# Patient Record
Sex: Female | Born: 1935 | Race: White | Hispanic: No | Smoking: Never smoker
Health system: Southern US, Community
[De-identification: ages and names within clinical notes are randomized; demographics above are authoritative.]

## PROBLEM LIST (undated history)

## (undated) DIAGNOSIS — E079 Disorder of thyroid, unspecified: Secondary | ICD-10-CM

## (undated) DIAGNOSIS — F329 Major depressive disorder, single episode, unspecified: Secondary | ICD-10-CM

## (undated) DIAGNOSIS — K649 Unspecified hemorrhoids: Secondary | ICD-10-CM

## (undated) DIAGNOSIS — N939 Abnormal uterine and vaginal bleeding, unspecified: Secondary | ICD-10-CM

## (undated) DIAGNOSIS — N809 Endometriosis, unspecified: Secondary | ICD-10-CM

## (undated) DIAGNOSIS — D219 Benign neoplasm of connective and other soft tissue, unspecified: Secondary | ICD-10-CM

## (undated) DIAGNOSIS — R112 Nausea with vomiting, unspecified: Secondary | ICD-10-CM

## (undated) DIAGNOSIS — F419 Anxiety disorder, unspecified: Secondary | ICD-10-CM

## (undated) DIAGNOSIS — F32A Depression, unspecified: Secondary | ICD-10-CM

## (undated) DIAGNOSIS — J302 Other seasonal allergic rhinitis: Secondary | ICD-10-CM

## (undated) DIAGNOSIS — R32 Unspecified urinary incontinence: Secondary | ICD-10-CM

## (undated) DIAGNOSIS — T7840XA Allergy, unspecified, initial encounter: Secondary | ICD-10-CM

## (undated) DIAGNOSIS — H269 Unspecified cataract: Secondary | ICD-10-CM

## (undated) DIAGNOSIS — Z9889 Other specified postprocedural states: Secondary | ICD-10-CM

## (undated) DIAGNOSIS — C19 Malignant neoplasm of rectosigmoid junction: Secondary | ICD-10-CM

## (undated) DIAGNOSIS — J45909 Unspecified asthma, uncomplicated: Secondary | ICD-10-CM

## (undated) DIAGNOSIS — E785 Hyperlipidemia, unspecified: Secondary | ICD-10-CM

## (undated) DIAGNOSIS — J309 Allergic rhinitis, unspecified: Secondary | ICD-10-CM

## (undated) DIAGNOSIS — I10 Essential (primary) hypertension: Secondary | ICD-10-CM

## (undated) DIAGNOSIS — R7303 Prediabetes: Secondary | ICD-10-CM

## (undated) DIAGNOSIS — K219 Gastro-esophageal reflux disease without esophagitis: Secondary | ICD-10-CM

## (undated) HISTORY — DX: Anxiety disorder, unspecified: F41.9

## (undated) HISTORY — DX: Allergic rhinitis, unspecified: J30.9

## (undated) HISTORY — DX: Prediabetes: R73.03

## (undated) HISTORY — DX: Depression, unspecified: F32.A

## (undated) HISTORY — PX: OTHER SURGICAL HISTORY: SHX169

## (undated) HISTORY — DX: Malignant neoplasm of rectosigmoid junction: C19

## (undated) HISTORY — DX: Unspecified asthma, uncomplicated: J45.909

## (undated) HISTORY — PX: TONSILLECTOMY: SHX5217

## (undated) HISTORY — PX: DILATION AND CURETTAGE OF UTERUS: SHX78

## (undated) HISTORY — DX: Abnormal uterine and vaginal bleeding, unspecified: N93.9

## (undated) HISTORY — DX: Endometriosis, unspecified: N80.9

## (undated) HISTORY — DX: Unspecified urinary incontinence: R32

## (undated) HISTORY — DX: Disorder of thyroid, unspecified: E07.9

## (undated) HISTORY — DX: Allergy, unspecified, initial encounter: T78.40XA

## (undated) HISTORY — PX: COLONOSCOPY: SHX174

## (undated) HISTORY — DX: Unspecified hemorrhoids: K64.9

## (undated) HISTORY — DX: Essential (primary) hypertension: I10

## (undated) HISTORY — DX: Major depressive disorder, single episode, unspecified: F32.9

## (undated) HISTORY — DX: Benign neoplasm of connective and other soft tissue, unspecified: D21.9

## (undated) HISTORY — PX: POLYPECTOMY: SHX149

## (undated) HISTORY — DX: Unspecified cataract: H26.9

## (undated) HISTORY — DX: Gastro-esophageal reflux disease without esophagitis: K21.9

## (undated) NOTE — *Deleted (*Deleted)
Diagnosis: COVID-19  Physician: Dr. Patrick Wright  Procedure: [facility] Med: Sotrovimab infusion - Provided patient with sotrovimab fact sheet for patients, parents, and caregivers prior to infusion.   Complications: No immediate complications noted  Discharge: Discharged home    

---

## 1995-09-17 DIAGNOSIS — C19 Malignant neoplasm of rectosigmoid junction: Secondary | ICD-10-CM

## 1995-09-17 HISTORY — PX: OTHER SURGICAL HISTORY: SHX169

## 1995-09-17 HISTORY — DX: Malignant neoplasm of rectosigmoid junction: C19

## 1995-09-17 HISTORY — PX: COLON SURGERY: SHX602

## 1996-04-22 ENCOUNTER — Encounter (INDEPENDENT_AMBULATORY_CARE_PROVIDER_SITE_OTHER): Payer: Self-pay | Admitting: *Deleted

## 1996-04-26 ENCOUNTER — Encounter: Payer: Self-pay | Admitting: Gastroenterology

## 1996-05-02 ENCOUNTER — Encounter: Payer: Self-pay | Admitting: Gastroenterology

## 1997-05-09 ENCOUNTER — Encounter (INDEPENDENT_AMBULATORY_CARE_PROVIDER_SITE_OTHER): Payer: Self-pay | Admitting: *Deleted

## 1998-08-18 ENCOUNTER — Other Ambulatory Visit: Admission: RE | Admit: 1998-08-18 | Discharge: 1998-08-18 | Payer: Self-pay | Admitting: Family Medicine

## 1998-08-25 ENCOUNTER — Ambulatory Visit (HOSPITAL_COMMUNITY): Admission: RE | Admit: 1998-08-25 | Discharge: 1998-08-25 | Payer: Self-pay | Admitting: Gastroenterology

## 1999-05-17 ENCOUNTER — Ambulatory Visit (HOSPITAL_COMMUNITY): Admission: RE | Admit: 1999-05-17 | Discharge: 1999-05-17 | Payer: Self-pay

## 1999-12-24 ENCOUNTER — Encounter: Payer: Self-pay | Admitting: Emergency Medicine

## 1999-12-24 ENCOUNTER — Emergency Department (HOSPITAL_COMMUNITY): Admission: EM | Admit: 1999-12-24 | Discharge: 1999-12-24 | Payer: Self-pay | Admitting: Emergency Medicine

## 2000-08-11 ENCOUNTER — Ambulatory Visit (HOSPITAL_COMMUNITY): Admission: RE | Admit: 2000-08-11 | Discharge: 2000-08-11 | Payer: Self-pay | Admitting: Family Medicine

## 2000-08-11 ENCOUNTER — Encounter: Payer: Self-pay | Admitting: Family Medicine

## 2000-10-31 ENCOUNTER — Encounter: Payer: Self-pay | Admitting: Gastroenterology

## 2001-04-08 ENCOUNTER — Other Ambulatory Visit: Admission: RE | Admit: 2001-04-08 | Discharge: 2001-04-08 | Payer: Self-pay | Admitting: Family Medicine

## 2001-04-15 ENCOUNTER — Ambulatory Visit (HOSPITAL_COMMUNITY): Admission: RE | Admit: 2001-04-15 | Discharge: 2001-04-15 | Payer: Self-pay | Admitting: Family Medicine

## 2001-04-15 ENCOUNTER — Encounter: Payer: Self-pay | Admitting: Family Medicine

## 2001-10-30 ENCOUNTER — Ambulatory Visit (HOSPITAL_COMMUNITY): Admission: RE | Admit: 2001-10-30 | Discharge: 2001-10-30 | Payer: Self-pay | Admitting: Family Medicine

## 2001-10-30 ENCOUNTER — Encounter: Payer: Self-pay | Admitting: Family Medicine

## 2003-04-19 ENCOUNTER — Other Ambulatory Visit: Admission: RE | Admit: 2003-04-19 | Discharge: 2003-04-19 | Payer: Self-pay | Admitting: Family Medicine

## 2004-05-10 ENCOUNTER — Encounter: Payer: Self-pay | Admitting: Gastroenterology

## 2005-08-23 ENCOUNTER — Ambulatory Visit (HOSPITAL_COMMUNITY): Admission: RE | Admit: 2005-08-23 | Discharge: 2005-08-23 | Payer: Self-pay | Admitting: Family Medicine

## 2005-10-04 ENCOUNTER — Other Ambulatory Visit: Admission: RE | Admit: 2005-10-04 | Discharge: 2005-10-04 | Payer: Self-pay | Admitting: Family Medicine

## 2007-06-23 ENCOUNTER — Ambulatory Visit: Payer: Self-pay | Admitting: Gastroenterology

## 2007-07-03 ENCOUNTER — Ambulatory Visit: Payer: Self-pay | Admitting: Gastroenterology

## 2008-03-09 ENCOUNTER — Encounter: Payer: Self-pay | Admitting: Gastroenterology

## 2009-06-22 ENCOUNTER — Ambulatory Visit: Payer: Self-pay | Admitting: Gastroenterology

## 2009-06-22 DIAGNOSIS — K219 Gastro-esophageal reflux disease without esophagitis: Secondary | ICD-10-CM | POA: Insufficient documentation

## 2009-06-22 DIAGNOSIS — R1319 Other dysphagia: Secondary | ICD-10-CM

## 2009-06-22 DIAGNOSIS — Z85038 Personal history of other malignant neoplasm of large intestine: Secondary | ICD-10-CM | POA: Insufficient documentation

## 2010-05-14 ENCOUNTER — Encounter: Payer: Self-pay | Admitting: Gastroenterology

## 2010-05-17 ENCOUNTER — Encounter (INDEPENDENT_AMBULATORY_CARE_PROVIDER_SITE_OTHER): Payer: Self-pay | Admitting: *Deleted

## 2010-07-12 ENCOUNTER — Telehealth: Payer: Self-pay | Admitting: Gastroenterology

## 2010-07-17 ENCOUNTER — Encounter: Payer: Self-pay | Admitting: Gastroenterology

## 2010-08-22 ENCOUNTER — Ambulatory Visit: Payer: Self-pay | Admitting: Gastroenterology

## 2010-08-22 ENCOUNTER — Encounter (INDEPENDENT_AMBULATORY_CARE_PROVIDER_SITE_OTHER): Payer: Self-pay | Admitting: *Deleted

## 2010-09-11 ENCOUNTER — Encounter: Payer: Self-pay | Admitting: Gastroenterology

## 2010-10-16 NOTE — Miscellaneous (Signed)
Summary: Omeprazole refill  Clinical Lists Changes  Medications: Rx of OMEPRAZOLE 40 MG CPDR (OMEPRAZOLE) one tablet by mouth once daily Needs office visit!!;  #30 x 0;  Signed;  Entered by: Christie Nottingham CMA (AAMA);  Authorized by: Meryl Dare MD [facility];  Method used: Faxed to [facility], [address], [place], [place]  [address], Ph: [phone], Fax: [phone]    Prescriptions: OMEPRAZOLE 40 MG CPDR (OMEPRAZOLE) one tablet by mouth once daily Needs office visit!!  #30 x 0   Entered by:   Christie Nottingham CMA (AAMA)   Authorized by:   Meryl Dare MD [facility]   Signed by:   Christie Nottingham CMA (AAMA) on 07/17/2010   Method used:   Faxed to ...       [facility] (retail)       [address]       [place], [place]  [address]       Ph: [phone]       Fax: [phone]   RxID:   [id number]

## 2010-10-16 NOTE — Miscellaneous (Signed)
Summary: Omeprazole refill   Clinical Lists Changes  Medications: Changed medication from OMEPRAZOLE 40 MG CPDR (OMEPRAZOLE) one tablet by mouth once daily to OMEPRAZOLE 40 MG CPDR (OMEPRAZOLE) one tablet by mouth once daily NEEDS OFFICE VISIT FOR ANY FURTHER REFILLS! - Signed Rx of OMEPRAZOLE 40 MG CPDR (OMEPRAZOLE) one tablet by mouth once daily NEEDS OFFICE VISIT FOR ANY FURTHER REFILLS!;  #30 x 0;  Signed;  Entered by: Christie Nottingham CMA (AAMA);  Authorized by: Meryl Dare MD [facility];  Method used: Faxed to [facility], [address], [place], [place]  [address], Ph: [phone], Fax: [phone]    Prescriptions: OMEPRAZOLE 40 MG CPDR (OMEPRAZOLE) one tablet by mouth once daily NEEDS OFFICE VISIT FOR ANY FURTHER REFILLS!  #30 x 0   Entered by:   Christie Nottingham CMA (AAMA)   Authorized by:   Meryl Dare MD [facility]   Signed by:   Christie Nottingham CMA (AAMA) on 05/14/2010   Method used:   Faxed to ...       [facility] (retail)       [address]       [place], [place]  [address]       Ph: [phone]       Fax: [phone]   RxID:   [id number]

## 2010-10-16 NOTE — Letter (Signed)
Summary: [facility] Instructions  [facility]  [address] [place], [place] [address]   Phone: [phone]  Fax: [phone]       Tammie [name]    04-16-1935    MRN: [id number]        Procedure Day Dorna Bloom: Wednesday 10/17/2010       Arrival Time: 8:00am     Procedure Time: 9:00am     Location of Procedure:                    X  [facility] (4th Floor)   PREPARATION FOR COLONOSCOPY WITH MOVIPREP   Starting 5 days prior to your procedure 10/12/2010 do not eat nuts, seeds, popcorn, corn, beans, peas,  salads, or any raw vegetables.  Do not take any fiber supplements (e.g. Metamucil, Citrucel, and Benefiber).  THE DAY BEFORE YOUR PROCEDURE         Tuesday 10/16/2010  1.  Drink clear liquids the entire day-NO SOLID FOOD  2.  Do not drink anything colored red or purple.  Avoid juices with pulp.  No orange juice.  3.  Drink at least 64 oz. (8 glasses) of fluid/clear liquids during the day to prevent dehydration and help the prep work efficiently.  CLEAR LIQUIDS INCLUDE: Water Jello Ice Popsicles Tea (sugar ok, no milk/cream) Powdered fruit flavored drinks Coffee (sugar ok, no milk/cream) Gatorade Juice: apple, white grape, white cranberry  Lemonade Clear bullion, consomm, broth Carbonated beverages (any kind) Strained chicken noodle soup Hard Candy                             4.  In the morning, mix first dose of MoviPrep solution:    Empty 1 Pouch A and 1 Pouch B into the disposable container    Add lukewarm drinking water to the top line of the container. Mix to dissolve    Refrigerate (mixed solution should be used within 24 hrs)  5.  Begin drinking the prep at 5:00 p.m. The MoviPrep container is divided by 4 marks.   Every 15 minutes drink the solution down to the next mark (approximately 8 oz) until the full liter is complete.   6.  Follow completed prep with 16 oz of clear liquid of your choice (Nothing red or purple).  Continue to  drink clear liquids until bedtime.  7.  Before going to bed, mix second dose of MoviPrep solution:    Empty 1 Pouch A and 1 Pouch B into the disposable container    Add lukewarm drinking water to the top line of the container. Mix to dissolve    Refrigerate  THE DAY OF YOUR PROCEDURE      Wednesday 10/17/2010  Beginning at 4:00am (5 hours before procedure):         1. Every 15 minutes, drink the solution down to the next mark (approx 8 oz) until the full liter is complete.  2. Follow completed prep with 16 oz. of clear liquid of your choice.    3. You may drink clear liquids until 7:00am (2 HOURS BEFORE PROCEDURE).   MEDICATION INSTRUCTIONS  Unless otherwise instructed, you should take regular prescription medications with a small sip of water   as early as possible the morning of your procedure.           OTHER INSTRUCTIONS  You will need a responsible adult at least 74 years of age to accompany  you and drive you home.   This person must remain in the waiting room during your procedure.  Wear loose fitting clothing that is easily removed.  Leave jewelry and other valuables at home.  However, you may wish to bring a book to read or  an iPod/MP3 player to listen to music as you wait for your procedure to start.  Remove all body piercing jewelry and leave at home.  Total time from sign-in until discharge is approximately 2-3 hours.  You should go home directly after your procedure and rest.  You can resume normal activities the  day after your procedure.  The day of your procedure you should not:   Drive   Make legal decisions   Operate machinery   Drink alcohol   Return to work  You will receive specific instructions about eating, activities and medications before you leave.    The above instructions have been reviewed and explained to me by   _______________________    I fully understand and can verbalize these instructions  _____________________________ Date _________

## 2010-10-16 NOTE — Assessment & Plan Note (Signed)
Summary: REFLUX F/U.Tammie KitchenMarland KitchenAS.   History of Present Illness Visit Type: Follow-up Visit Primary GI MD: Elie Goody MD [facility] Primary Provider: Lindaann Pascal, PA-C Chief Complaint: Patient states that Omeprazole has stopped all symtoms of GERD History of Present Illness:   Tammie [name] reports complete resolution of her dysphagia and reflux symptoms shortly after beginning omeprazole last year. She canceled her endoscopy in January. She has no gastrointestinal complaints. She is status post sigmoid colectomy in 1997 for T3, N0 colon cancer and is due for surveillance colonoscopy.   GI Review of Systems      Denies abdominal pain, acid reflux, belching, bloating, chest pain, dysphagia with liquids, dysphagia with solids, heartburn, loss of appetite, nausea, vomiting, vomiting blood, weight loss, and  weight gain.        Denies anal fissure, black tarry stools, change in bowel habit, constipation, diarrhea, diverticulosis, fecal incontinence, heme positive stool, hemorrhoids, irritable bowel syndrome, jaundice, light color stool, liver problems, rectal bleeding, and  rectal pain.   Current Medications (verified): 1)  Norvasc 5 Mg Tabs (Amlodipine Besylate) .... Take 1 Tablet By Mouth Once A Day 2)  Paxil 10 Mg Tabs (Paroxetine Hcl) .... Take 1 Tablet By Mouth Once A Day 3)  Fish Oil Double Strength 1200 Mg Caps (Omega-3 Fatty Acids) .... Take 1 Capsule Once Daily 4)  Vitamin D3 1000 Unit Tabs (Cholecalciferol) .... Take 1 Tablet By Mouth Once A Day 5)  Centrum Silver  Tabs (Multiple Vitamins-Minerals) .... Take 1 Tablet By Mouth Once A Day 6)  Omeprazole 40 Mg Cpdr (Omeprazole) .... One Tablet By Mouth Once Daily Needs Office Visit!! 7)  Fluticasone Propionate 50 Mcg/act Susp (Fluticasone Propionate) .... As Directed  Allergies (verified): 1)  ! * Fentanyl 2)  ! Demerol 3)  ! Pcn 4)  ! Phenergan 5)  ! Vicodin 6)  ! * Novacaine 7)  ! Heparin 8)  ! Erythromycin 9)  ! * Temazepam  Past  History:  Past Medical History: Reviewed history from 06/22/2009 and no changes required. Hypertension Allergic rhinitis Colorectal Cancer T3, N0 1997 Uterine fibroids Anxiety Disorder Depression Hemorrhoids  Past Surgical History: Reviewed history from 06/22/2009 and no changes required. Sigmoid colectomy 1997 D & C Tonsillectomy  Family History: Reviewed history from 06/22/2009 and no changes required. No FH of Colon Cancer:  Social History: Reviewed history from 06/22/2009 and no changes required. Patient has never smoked.  Alcohol Use - yes-occasional Daily Caffeine Use-2 cups daily Illicit Drug Use - no  Review of Systems  The patient denies allergy/sinus, anemia, anxiety-new, arthritis/joint pain, back pain, blood in urine, breast changes/lumps, change in vision, confusion, cough, coughing up blood, depression-new, fainting, fatigue, fever, headaches-new, hearing problems, heart murmur, heart rhythm changes, itching, menstrual pain, muscle pains/cramps, night sweats, nosebleeds, pregnancy symptoms, shortness of breath, skin rash, sleeping problems, sore throat, swelling of feet/legs, swollen lymph glands, thirst - excessive , urination - excessive , urination changes/pain, urine leakage, vision changes, and voice change.    Vital Signs:  Patient profile:   76 year old female Height:      64 inches Weight:      175 pounds BMI:     30.15 Pulse rate:   60 / minute Pulse rhythm:   regular BP sitting:   150 / 70  (right arm) Cuff size:   regular  Vitals Entered By: June McMurray CMA Duncan Dull) (August 22, 2010 10:25 AM)  Physical Exam  General:  Well developed, well nourished, no acute  distress. Head:  Normocephalic and atraumatic. Eyes:  PERRLA, no icterus. Mouth:  No deformity or lesions, dentition normal. Lungs:  Clear throughout to auscultation. Heart:  Regular rate and rhythm; no murmurs, rubs,  or bruits. Abdomen:  Soft, nontender and nondistended. No  masses, hepatosplenomegaly or hernias noted. Normal bowel sounds. Msk:  Symmetrical with no gross deformities. Normal posture. Neurologic:  Alert and  oriented x4;  grossly normal neurologically. Psych:  Alert and cooperative. Normal mood and affect.  Impression & Recommendations:  Problem # 1:  PERSONAL HX COLON CANCER (ICD-V10.05) T3 N0 colon cancer 1997. Due for surveillance colonoscopy. The risks, benefits and alternatives to colonoscopy with possible biopsy and possible polypectomy were discussed with the patient and they consent to proceed. The procedure will be scheduled electively. Orders: Colon/Endo (Colon/Endo)  Problem # 2:  GERD (ICD-530.81) Chronic GERD which is now well-controlled on omeprazole. Continue omeprazole, antireflux measures. Schedule endoscopy to screen for Barrett's.. The risks, benefits and alternatives to endoscopy with possible biopsy and possible dilation were discussed with the patient and they consent to proceed. The procedure will be scheduled electively. Orders: Colon/Endo (Colon/Endo)  Patient Instructions: 1)  Copy sent to : Tammie Pascal, PA-C 2)  Your procedure has been scheduled for 10/17/2010, please follow the seperate instructions.  3)  Your prescription(s) have been sent to you pharmacy.  4)  [facility] Patient Information Guide given to patient.  5)  Upper Endoscopy and colonoscopy brochures given.  6)  The medication list was reviewed and reconciled.  All changed / newly prescribed medications were explained.  A complete medication list was provided to the patient / caregiver.  Prescriptions: MOVIPREP 100 GM  SOLR (PEG-KCL-NACL-NASULF-NA ASC-C) As per prep instructions.  #1 x 0   Entered by:   Harlow Mares CMA (AAMA)   Authorized by:   Meryl Dare MD [facility]   Signed by:   Harlow Mares CMA (AAMA) on 08/22/2010   Method used:   Faxed to ...       [facility] (retail)       [address]       [place], [place]   [address]       Ph: [phone]       Fax: [phone]   RxID:   [id number] OMEPRAZOLE 40 MG CPDR (OMEPRAZOLE) one tablet by mouth once daily  #30 x 11   Entered by:   Harlow Mares CMA (AAMA)   Authorized by:   Meryl Dare MD [facility]   Signed by:   Harlow Mares CMA (AAMA) on 08/22/2010   Method used:   Faxed to ...       [facility] (retail)       [address]       [place], [place]  [address]       Ph: [phone]       Fax: [phone]   RxID:   [id number]

## 2010-10-16 NOTE — Progress Notes (Signed)
Summary: med request  Medications Added OMEPRAZOLE 40 MG CPDR (OMEPRAZOLE) one tablet by mouth once daily Needs office visit!!       Phone Note Call from Patient Call back at [facility] Phone [phone]   Caller: Patient Call For: Dr. Russella Dar Reason for Call: Talk to Nurse Summary of Call: needs rx renewal for Omeprazole... [facility], [phone] Initial call taken by: Vallarie Mare,  July 12, 2010 12:08 PM  Follow-up for Phone Call        Rx was sent to pts pharmacy and pt notified to schedule a appointment for any further refills. Follow-up by: Christie Nottingham CMA Duncan Dull),  July 12, 2010 12:15 PM    New/Updated Medications: OMEPRAZOLE 40 MG CPDR (OMEPRAZOLE) one tablet by mouth once daily Needs office visit!! Prescriptions: OMEPRAZOLE 40 MG CPDR (OMEPRAZOLE) one tablet by mouth once daily Needs office visit!!  #30 x 0   Entered by:   Christie Nottingham CMA (AAMA)   Authorized by:   Meryl Dare MD [facility]   Signed by:   Christie Nottingham CMA (AAMA) on 07/12/2010   Method used:   Faxed to ...       [facility] (retail)       [address]       [place], [place]  [address]       Ph: [phone]       Fax: [phone]   RxID:   [id number]

## 2010-10-16 NOTE — Letter (Signed)
Summary: Colonoscopy Letter  [facility]  [address] [place], [place] [address]   Phone: [phone]  Fax: [phone]      May 17, 2010 MRN: [id number]   Tammie [name] [address] [place], [place]  [address]   Dear [name],   According to your medical record, it is time for you to schedule a Colonoscopy. The American Cancer Society recommends this procedure as a method to detect early colon cancer. Patients with a family history of colon cancer, or a personal history of colon polyps or inflammatory bowel disease are at increased risk.  This letter has beeen generated based on the recommendations made at the time of your procedure. If you feel that in your particular situation this may no longer apply, please contact our office.  Please call our office at [phone] to schedule this appointment or to update your records at your earliest convenience.  Thank you for cooperating with [place] to provide you with the very best care possible.   Sincerely,  Judie Petit T. Russella Dar, M.D.  [facility] [phone]

## 2010-10-17 ENCOUNTER — Other Ambulatory Visit: Payer: Self-pay | Admitting: Gastroenterology

## 2010-10-17 ENCOUNTER — Encounter (AMBULATORY_SURGERY_CENTER): Payer: Medicare Other | Admitting: Gastroenterology

## 2010-10-17 ENCOUNTER — Encounter: Payer: Self-pay | Admitting: Gastroenterology

## 2010-10-17 ENCOUNTER — Ambulatory Visit: Admit: 2010-10-17 | Payer: Self-pay | Admitting: Gastroenterology

## 2010-10-17 DIAGNOSIS — K2940 Chronic atrophic gastritis without bleeding: Secondary | ICD-10-CM

## 2010-10-17 DIAGNOSIS — Z1211 Encounter for screening for malignant neoplasm of colon: Secondary | ICD-10-CM

## 2010-10-17 DIAGNOSIS — D126 Benign neoplasm of colon, unspecified: Secondary | ICD-10-CM

## 2010-10-17 DIAGNOSIS — K219 Gastro-esophageal reflux disease without esophagitis: Secondary | ICD-10-CM

## 2010-10-17 DIAGNOSIS — K2970 Gastritis, unspecified, without bleeding: Secondary | ICD-10-CM

## 2010-10-17 DIAGNOSIS — Z85038 Personal history of other malignant neoplasm of large intestine: Secondary | ICD-10-CM

## 2010-10-18 NOTE — Miscellaneous (Signed)
Summary: omeprazole refill  Clinical Lists Changes  Medications: Rx of OMEPRAZOLE 40 MG CPDR (OMEPRAZOLE) one tablet by mouth once daily;  #30 x 11;  Signed;  Entered by: Christie Nottingham CMA (AAMA);  Authorized by: Meryl Dare MD [facility];  Method used: Faxed to [facility], [address], [place], [place]  [address], Ph: [phone], Fax: [phone]    Prescriptions: OMEPRAZOLE 40 MG CPDR (OMEPRAZOLE) one tablet by mouth once daily  #30 x 11   Entered by:   Christie Nottingham CMA (AAMA)   Authorized by:   Meryl Dare MD [facility]   Signed by:   Christie Nottingham CMA (AAMA) on 09/11/2010   Method used:   Faxed to ...       [facility] (retail)       [address]       [place], [place]  [address]       Ph: [phone]       Fax: [phone]   RxID:   [id number]

## 2010-10-22 ENCOUNTER — Encounter: Payer: Self-pay | Admitting: Gastroenterology

## 2010-10-24 NOTE — Miscellaneous (Signed)
Summary: Change allergy  Clinical Lists Changes  Allergies: Removed allergy or adverse reaction of * FENTANYL

## 2010-10-24 NOTE — Procedures (Addendum)
Summary: Colonoscopy  Patient: [name] Note: All result statuses are Final unless otherwise noted.  Tests: (1) Colonoscopy (COL)   COL Colonoscopy           DONE     [facility]     [address]     [place], [place]  [address]           COLONOSCOPY PROCEDURE REPORT     PATIENT:  Tammie [name], Tammie [name]  MR#:  [id number]     BIRTHDATE:  07-23-1935, 75 yrs. old  GENDER:  female     ENDOSCOPIST:  Judie Petit T. Russella Dar, MD, [facility]           PROCEDURE DATE:  10/17/2010     PROCEDURE:  Colonoscopy with snare polypectomy     ASA CLASS:  Class II     INDICATIONS:  1) surveillance and high-risk screening  2) history     of colon cancer: T3, N0 1997.     MEDICATIONS:   Fentanyl 50 mcg IV, Versed 9 mg IV, Benadryl 50 mg     IV     DESCRIPTION OF PROCEDURE:   After the risks benefits and     alternatives of the procedure were thoroughly explained, informed     consent was obtained.  Digital rectal exam was performed and     revealed no abnormalities.   The LB [id number] endoscope     was introduced through the anus and advanced to the cecum, which     was identified by both the appendix and ileocecal valve, limited     by fair prep.    The quality of the prep was Moviprep fair.  The     instrument was then slowly withdrawn as the colon was fully     examined.     <<PROCEDUREIMAGES>>     FINDINGS:  A sessile polyp was found in the mid transverse colon.     It was 5 mm in size. Polyp was snared without cautery. Retrieval     was successful. snare polyp  There was evidence of a prior     segmental colectomy. in the sigmoid colon.  A normal appearing     cecum, ileocecal valve, and appendiceal orifice were identified.     The ascending, hepatic flexure, splenic flexure, descending colon,     and rectum appeared unremarkable. Retroflexed views in the rectum     revealed internal hemorrhoids, small. The time to cecum =  time to     Cecum 2.25  minutes. The scope was then withdrawn  (time =  10.5     min) from the patient and the procedure completed.     COMPLICATIONS:  None           ENDOSCOPIC IMPRESSION:     1) 5 mm sessile polyp in the mid transverse colon     2) Prior segmental colectomy in the sigmoid colon     3) Internal hemorrhoids           RECOMMENDATIONS:     1) Await pathology results     2) Repeat Colonoscopy in 3 years.           Venita Lick. Russella Dar, MD, [facility]           CC:  Lindaann Pascal, PA           n.     Rosalie DoctorVenita Lick. Roscoe Witts at 10/17/2010 09:53 AM  [place], [place], [id number]  Note: An exclamation mark (!) indicates a result that was not dispersed into the flowsheet. Document Creation Date: 10/17/2010 9:53 AM _______________________________________________________________________  (1) Order result status: Final Collection or observation date-time: 10/17/2010 09:47 Requested date-time:  Receipt date-time:  Reported date-time:  Referring Physician:   Ordering Physician: Claudette Head [phone]) Specimen Source:  Source: Launa Grill Order Number: [id number] Lab site:   Appended Document: Colonoscopy     Procedures Next Due Date:    Colonoscopy: 10/2013

## 2010-10-24 NOTE — Procedures (Addendum)
Summary: Upper Endoscopy  Patient: Tammie [name] Note: All result statuses are Final unless otherwise noted.  Tests: (1) Upper Endoscopy (EGD)   EGD Upper Endoscopy       DONE     [facility]     [address]     [place], [place]  [address]           ENDOSCOPY PROCEDURE REPORT     PATIENT:  Tammie [name], Tammie [name]  MR#:  [id number]     BIRTHDATE:  1935/01/24, 75 yrs. old  GENDER:  female     ENDOSCOPIST:  Judie Petit T. Russella Dar, MD, [facility]           PROCEDURE DATE:  10/17/2010     PROCEDURE:  EGD with biopsy, [id number]     ASA CLASS:  Class II     INDICATIONS:  GERD, Screening for Barrett's esopahgus in a GERD     patient.     MEDICATIONS:  There was residual sedation effect present from     prior procedure.     TOPICAL ANESTHETIC:  Exactacain Spray     DESCRIPTION OF PROCEDURE:   After the risks benefits and     alternatives of the procedure were thoroughly explained, informed     consent was obtained.  The LB [id number] [id number] endoscope was     introduced through the mouth and advanced to the second portion of     the duodenum, without limitations.  The instrument was slowly     withdrawn as the mucosa was fully examined.     <<PROCEDUREIMAGES>>     A stricture was found at the gastroesophageal junction. It was     circumferential and benign appearing. Multiple biopsies were     obtained and sent to pathology. Moderate gastritis was found in     the total stomach. It was erythematous and granular. Multiple     biopsies were obtained and sent to pathology. The duodenal bulb     was normal in appearance, as was the postbulbar duodenum.     Otherwise normal esophagus. Retroflexed views revealed a hiatal     hernia, small. The scope was then withdrawn from the patient and     the procedure completed.           COMPLICATIONS:  None           ENDOSCOPIC IMPRESSION:     1) Stricture at the gastroesophageal junction     2) Moderate gastritis     3) Small hiatal hernia        RECOMMENDATIONS:     1) Anti-reflux regimen     2) Await pathology results     3) PPI qam           Avner Stroder T. Russella Dar, MD, [facility]           CC:  Lindaann Pascal, PA           n.     Rosalie DoctorVenita Lick. Meghann Landing at 10/17/2010 10:04 AM           Raj Janus, [id number]  Note: An exclamation mark (!) indicates a result that was not dispersed into the flowsheet. Document Creation Date: 10/17/2010 10:05 AM _______________________________________________________________________  (1) Order result status: Final Collection or observation date-time: 10/17/2010 09:58 Requested date-time:  Receipt date-time:  Reported date-time:  Referring Physician:   Ordering Physician: Claudette Head [phone]) Specimen Source:  Source: Launa Grill Order Number: [id number] Lab site:

## 2010-11-01 NOTE — Letter (Signed)
Summary: Patient [facility] Biopsy Results  [facility]  [address] [place], [place] [address]   Phone: [phone]  Fax: [phone]        October 22, 2010 MRN: [id number]    Tammie [name] [address] [place], [place]  [address]    Dear Ms. Hewitt,  I am pleased to inform you that the biopsies taken during your recent endoscopic examination did not show any evidence of cancer upon pathologic examination. The biopsies showed reflux changes and mild gastritis.  Continue with the treatment plan as outlined on the day of your      exam.  Please call us if you are having persistent problems or have questions about your condition that have not been fully answered at this time.  Sincerely,  Meryl Dare MD [facility]  This letter has been electronically signed by your physician.  Appended Document: Patient Notice-Endo Biopsy Results LETTER MAILED

## 2010-11-01 NOTE — Letter (Signed)
Summary: Patient Notice- Polyp Results  [facility]  [address] [place], [place] [address]   Phone: [phone]  Fax: [phone]        October 22, 2010 MRN: [id number]    NIJA KOOPMAN [address] [place], [place]  [address]    Dear Ms. Pennisi,  I am pleased to inform you that the colon polyp(s) removed during your recent colonoscopy was (were) found to be benign (no cancer detected) upon pathologic examination.  I recommend you have a repeat colonoscopy examination in 3 years to look for recurrent polyps, as having colon polyps increases your risk for having recurrent polyps or even colon cancer in the future.  Should you develop new or worsening symptoms of abdominal pain, bowel habit changes or bleeding from the rectum or bowels, please schedule an evaluation with either your primary care physician or with me.  Continue treatment plan as outlined the day of your exam.  Please call us if you are having persistent problems or have questions about your condition that have not been fully answered at this time.  Sincerely,  Meryl Dare MD [facility]  This letter has been electronically signed by your physician.  Appended Document: Patient Notice- Polyp Results LETTER MAILED

## 2011-09-18 ENCOUNTER — Other Ambulatory Visit: Payer: Self-pay

## 2011-09-18 MED ORDER — OMEPRAZOLE 40 MG PO CPDR: 40.0000 mg | DELAYED_RELEASE_CAPSULE | Freq: Every day | ORAL | Status: DC

## 2012-05-27 ENCOUNTER — Encounter: Payer: Self-pay | Admitting: Cardiology

## 2012-05-27 ENCOUNTER — Ambulatory Visit (INDEPENDENT_AMBULATORY_CARE_PROVIDER_SITE_OTHER): Payer: Medicare Other | Admitting: Cardiology

## 2012-05-27 VITALS — BP 200/90 | HR 53 | Ht 64.0 in | Wt 175.0 lb

## 2012-05-27 DIAGNOSIS — R9431 Abnormal electrocardiogram [ECG] [EKG]: Secondary | ICD-10-CM

## 2012-05-27 MED ORDER — HYDROCHLOROTHIAZIDE 12.5 MG PO CAPS: 12.5000 mg | ORAL_CAPSULE | Freq: Every day | ORAL | Status: DC

## 2012-05-27 NOTE — Progress Notes (Signed)
HPI The patient denies any past cardiac risk factors. She does have blood pressure problems. She does have risk factors. She was referred when she was recently found to have an abnormal EKG with right bundle branch block. She also had some arm discomfort while her blood pressure cuff was being pumped up. She is not particularly active though she does help her for a young grandchild. She walks 50 yards to her mailbox. With this level of activity she denies chest pressure, neck or arm discomfort. She denies any palpitations, presyncope or syncope. She does get some dyspnea with exertion but she does not have PND or orthopnea. She has a mild lower extremity edema and has had a slow progressive weight gain.  Allergies  Allergen Reactions  . Erythromycin   . Fentanyl   . Heparin     REACTION: questionable  . Hydrocodone-Acetaminophen   . Meperidine Hcl   . Penicillins   . Procaine Hcl   . Promethazine Hcl   . Temazepam     Current Outpatient Prescriptions  Medication Sig Dispense Refill  . amLODipine (NORVASC) 5 MG tablet Take 5 mg by mouth daily.      . Multiple Vitamins-Minerals (CENTRUM SILVER PO) Take by mouth. 1 TAB DAILY      . Omega-3 Fatty Acids (FISH OIL) 1200 MG CAPS Take by mouth. DOUBLE STENGTH 1200MG   ---1 CAPSULE ONCE DAILY      . omeprazole (PRILOSEC) 40 MG capsule Take 1 capsule (40 mg total) by mouth daily.  30 capsule  1  . PARoxetine (PAXIL) 10 MG tablet Take 10 mg by mouth every morning.      . hydrochlorothiazide (MICROZIDE) 12.5 MG capsule Take 1 capsule (12.5 mg total) by mouth daily.  30 capsule  11  . DISCONTD: omeprazole (PRILOSEC) 40 MG capsule Take 40 mg by mouth daily.        Past Medical History  Diagnosis Date  . Hypertension   . Allergic rhinitis   . Colorectal cancer   . Anxiety disorder   . Depression   . Hemorrhoids     Past Surgical History  Procedure Date  . Colectomy 1997    SIGMOID COLECTOMY 1997  . Dilation and curettage of uterus    D & C  . Tonsillectomy     Family History  Problem Relation Age of Onset  . Sudden death Brother     Apparent DVT/PE  . Sudden death Cousin 24    Paternal cousin   . Coronary artery disease Cousin 29    Paternal cousin, MI    History   Social History  . Marital Status: Divorced    Spouse Name: N/A    Number of Children: N/A  . Years of Education: N/A   Occupational History  . Not on file.   Social History Main Topics  . Smoking status: Never Smoker   . Smokeless tobacco: Not on file  . Alcohol Use: Not on file  . Drug Use: Not on file  . Sexually Active: Not on file   Other Topics Concern  . Not on file   Social History Narrative   Lives alone.  Two children     ROS: Postive for headaches, palpitations, reflux.  Otherwise as stated in the HPI and negative for all other systems.   PHYSICAL EXAM BP 200/90  Pulse 53  Ht 5\' 4"  (1.626 m)  Wt 175 lb (79.379 kg)  BMI 30.04 kg/m2 GENERAL:  Well appearing HEENT:  Pupils  equal round and reactive, fundi not visualized, oral mucosa unremarkable NECK:  No jugular venous distention, waveform within normal limits, carotid upstroke brisk and symmetric, no bruits, no thyromegaly LYMPHATICS:  No cervical, inguinal adenopathy LUNGS:  Clear to auscultation bilaterally BACK:  No CVA tenderness CHEST:  Unremarkable HEART:  PMI not displaced or sustained,S1 and S2 within normal limits, no S3, no S4, no clicks, no rubs, no murmurs ABD:  Flat, positive bowel sounds normal in frequency in pitch, no bruits, no rebound, no guarding, no midline pulsatile mass, no hepatomegaly, no splenomegaly EXT:  2 plus pulses throughout, no edema, no cyanosis no clubbing SKIN:  No rashes no nodules NEURO:  Cranial nerves II through XII grossly intact, motor grossly intact throughout PSYCH:  Cognitively intact, oriented to person place and time   EKG:  Sinus rhythm, rate 53, right bundle branch block, QTC prolonged.  Compared with the previous EKG  dated 04/03/12 the QTC is slightly prolonged. 05/27/2012  ASSESSMENT AND PLAN  Abnormal EKG - The patient does have a right bundle branch block which is new since 2004. She has some risk factors and atypical symptoms. I think the pretest probability of obstructive coronary disease is somewhat low. However, she needs screening stress testing. Her blood pressure would preclude exercise treadmill testing and she would rather avoid this anyway. Therefore, she will have a [employer].  HTN - I instructed her on keeping a blood pressure diary. I will go ahead and start hydrochlorothiazide 12.5 in addition to her previous meds. She will get a basic metabolic profile in 10 days.  Overweight - We discussed specifics for weight loss.

## 2012-05-27 NOTE — Patient Instructions (Addendum)
Please start HCTZ 12.5 mg a day Continue all other medications as listed  Please have blood work in 2 weeks (BMP)  Your physician has requested that you have a lexiscan myoview. For further information please visit [email or web address] Please follow instruction sheet, as given.  Follow up will be based on your results.

## 2012-06-24 ENCOUNTER — Encounter (HOSPITAL_COMMUNITY): Payer: Medicare Other

## 2012-07-22 ENCOUNTER — Encounter (HOSPITAL_COMMUNITY): Payer: Medicare Other

## 2012-08-20 ENCOUNTER — Other Ambulatory Visit: Payer: Self-pay | Admitting: Family Medicine

## 2012-08-20 DIAGNOSIS — N63 Unspecified lump in unspecified breast: Secondary | ICD-10-CM

## 2012-09-21 ENCOUNTER — Ambulatory Visit
Admission: RE | Admit: 2012-09-21 | Discharge: 2012-09-21 | Disposition: A | Discharge status: Home / Self Care | Payer: Medicare Other | Source: Ambulatory Visit | Attending: Family Medicine | Admitting: Family Medicine

## 2012-09-21 DIAGNOSIS — N63 Unspecified lump in unspecified breast: Secondary | ICD-10-CM

## 2012-09-22 ENCOUNTER — Telehealth: Payer: Self-pay | Admitting: Gastroenterology

## 2012-09-22 NOTE — Telephone Encounter (Signed)
She should have her PCP refills this medication. If PCP will do this she does not need a routine office visit with [place] just for a refill. We prefer to have PCPs fill meds for mild, chronic and stable GI problems like GERD.

## 2012-09-22 NOTE — Telephone Encounter (Signed)
Patient wants one refill until her scheduled office visit but she has not been seen since December 2011 and last procedure was February 2012. Told patient I would have to ask the doctor since it has been so long and we have not been the one refilling the medicine in our system. Please advise if patient can a refill.

## 2012-09-23 NOTE — Telephone Encounter (Signed)
Pt notified by voicemail of Dr. Ardell Isaacs recommendations and to call back if she has any questions.

## 2012-10-13 ENCOUNTER — Ambulatory Visit: Payer: Medicare Other | Admitting: Gastroenterology

## 2013-03-10 ENCOUNTER — Other Ambulatory Visit: Payer: Self-pay | Admitting: *Deleted

## 2013-03-10 MED ORDER — HYDROCORTISONE 2.5 % RE CREA: TOPICAL_CREAM | Freq: Two times a day (BID) | RECTAL | Status: DC

## 2013-03-25 ENCOUNTER — Other Ambulatory Visit: Payer: Self-pay

## 2013-03-25 MED ORDER — PAROXETINE HCL 10 MG PO TABS: 10.0000 mg | ORAL_TABLET | ORAL | Status: DC

## 2013-03-25 NOTE — Telephone Encounter (Signed)
Last seen 3/14  MMM 

## 2013-04-20 ENCOUNTER — Other Ambulatory Visit: Payer: Self-pay | Admitting: *Deleted

## 2013-04-20 MED ORDER — PAROXETINE HCL 10 MG PO TABS: 20.0000 mg | ORAL_TABLET | ORAL | Status: DC

## 2013-05-14 ENCOUNTER — Encounter: Payer: Self-pay | Admitting: Family Medicine

## 2013-05-14 ENCOUNTER — Ambulatory Visit (HOSPITAL_COMMUNITY)
Admission: RE | Admit: 2013-05-14 | Discharge: 2013-05-14 | Disposition: A | Discharge status: Home / Self Care | Payer: Medicare Other | Source: Ambulatory Visit | Attending: Family Medicine | Admitting: Family Medicine

## 2013-05-14 ENCOUNTER — Ambulatory Visit (INDEPENDENT_AMBULATORY_CARE_PROVIDER_SITE_OTHER): Payer: Medicare Other | Admitting: Family Medicine

## 2013-05-14 ENCOUNTER — Ambulatory Visit (INDEPENDENT_AMBULATORY_CARE_PROVIDER_SITE_OTHER): Payer: Medicare Other

## 2013-05-14 VITALS — BP 165/64 | HR 59 | Temp 97.6°F | Ht 64.0 in | Wt 177.0 lb

## 2013-05-14 DIAGNOSIS — M25569 Pain in unspecified knee: Secondary | ICD-10-CM

## 2013-05-14 DIAGNOSIS — M79609 Pain in unspecified limb: Secondary | ICD-10-CM

## 2013-05-14 DIAGNOSIS — M25562 Pain in left knee: Secondary | ICD-10-CM

## 2013-05-14 DIAGNOSIS — M79662 Pain in left lower leg: Secondary | ICD-10-CM

## 2013-05-14 MED ORDER — PAROXETINE HCL 20 MG PO TABS: 20.0000 mg | ORAL_TABLET | Freq: Every day | ORAL | Status: DC

## 2013-05-14 NOTE — Patient Instructions (Signed)
Knee Pain  The knee is the complex joint between your thigh and your lower leg. It is made up of bones, tendons, ligaments, and cartilage. The bones that make up the knee are:   The femur in the thigh.   The tibia and fibula in the lower leg.   The patella or kneecap riding in the groove on the lower femur.  CAUSES   Knee pain is a common complaint with many causes. A few of these causes are:   Injury, such as:   A ruptured ligament or tendon injury.   Torn cartilage.   Medical conditions, such as:   Gout   Arthritis   Infections   Overuse, over training or overdoing a physical activity.  Knee pain can be minor or severe. Knee pain can accompany debilitating injury. Minor knee problems often respond well to self-care measures or get well on their own. More serious injuries may need medical intervention or even surgery.  SYMPTOMS  The knee is complex. Symptoms of knee problems can vary widely. Some of the problems are:   Pain with movement and weight bearing.   Swelling and tenderness.   Buckling of the knee.   Inability to straighten or extend your knee.   Your knee locks and you cannot straighten it.   Warmth and redness with pain and fever.   Deformity or dislocation of the kneecap.  DIAGNOSIS   Determining what is wrong may be very straight forward such as when there is an injury. It can also be challenging because of the complexity of the knee. Tests to make a diagnosis may include:   Your caregiver taking a history and doing a physical exam.   Routine X-rays can be used to rule out other problems. X-rays will not reveal a cartilage tear. Some injuries of the knee can be diagnosed by:   Arthroscopy a surgical technique by which a small video camera is inserted through tiny incisions on the sides of the knee. This procedure is used to examine and repair internal knee joint problems. Tiny instruments can be used during arthroscopy to repair the torn knee cartilage (meniscus).   Arthrography  is a radiology technique. A contrast liquid is directly injected into the knee joint. Internal structures of the knee joint then become visible on X-ray film.   An MRI scan is a non x-ray radiology procedure in which magnetic fields and a computer produce two- or three-dimensional images of the inside of the knee. Cartilage tears are often visible using an MRI scanner. MRI scans have largely replaced arthrography in diagnosing cartilage tears of the knee.   Blood work.   Examination of the fluid that helps to lubricate the knee joint (synovial fluid). This is done by taking a sample out using a needle and a syringe.  TREATMENT  The treatment of knee problems depends on the cause. Some of these treatments are:   Depending on the injury, proper casting, splinting, surgery or physical therapy care will be needed.   Give yourself adequate recovery time. Do not overuse your joints. If you begin to get sore during workout routines, back off. Slow down or do fewer repetitions.   For repetitive activities such as cycling or running, maintain your strength and nutrition.   Alternate muscle groups. For example if you are a weight lifter, work the upper body on one day and the lower body the next.   Either tight or weak muscles do not give the proper support for your   knee. Tight or weak muscles do not absorb the stress placed on the knee joint. Keep the muscles surrounding the knee strong.   Take care of mechanical problems.   If you have flat feet, orthotics or special shoes may help. See your caregiver if you need help.   Arch supports, sometimes with wedges on the inner or outer aspect of the heel, can help. These can shift pressure away from the side of the knee most bothered by osteoarthritis.   A brace called an "unloader" brace also may be used to help ease the pressure on the most arthritic side of the knee.   If your caregiver has prescribed crutches, braces, wraps or ice, use as directed. The acronym for  this is PRICE. This means protection, rest, ice, compression and elevation.   Nonsteroidal anti-inflammatory drugs (NSAID's), can help relieve pain. But if taken immediately after an injury, they may actually increase swelling. Take NSAID's with food in your stomach. Stop them if you develop stomach problems. Do not take these if you have a history of ulcers, stomach pain or bleeding from the bowel. Do not take without your caregiver's approval if you have problems with fluid retention, heart failure, or kidney problems.   For ongoing knee problems, physical therapy may be helpful.   Glucosamine and chondroitin are over-the-counter dietary supplements. Both may help relieve the pain of osteoarthritis in the knee. These medicines are different from the usual anti-inflammatory drugs. Glucosamine may decrease the rate of cartilage destruction.   Injections of a corticosteroid drug into your knee joint may help reduce the symptoms of an arthritis flare-up. They may provide pain relief that lasts a few months. You may have to wait a few months between injections. The injections do have a small increased risk of infection, water retention and elevated blood sugar levels.   Hyaluronic acid injected into damaged joints may ease pain and provide lubrication. These injections may work by reducing inflammation. A series of shots may give relief for as long as 6 months.   Topical painkillers. Applying certain ointments to your skin may help relieve the pain and stiffness of osteoarthritis. Ask your pharmacist for suggestions. Many over the-counter products are approved for temporary relief of arthritis pain.   In some countries, doctors often prescribe topical NSAID's for relief of chronic conditions such as arthritis and tendinitis. A review of treatment with NSAID creams found that they worked as well as oral medications but without the serious side effects.  PREVENTION   Maintain a healthy weight. Extra pounds put  more strain on your joints.   Get strong, stay limber. Weak muscles are a common cause of knee injuries. Stretching is important. Include flexibility exercises in your workouts.   Be smart about exercise. If you have osteoarthritis, chronic knee pain or recurring injuries, you may need to change the way you exercise. This does not mean you have to stop being active. If your knees ache after jogging or playing basketball, consider switching to swimming, water aerobics or other low-impact activities, at least for a few days a week. Sometimes limiting high-impact activities will provide relief.   Make sure your shoes fit well. Choose footwear that is right for your sport.   Protect your knees. Use the proper gear for knee-sensitive activities. Use kneepads when playing volleyball or laying carpet. Buckle your seat belt every time you drive. Most shattered kneecaps occur in car accidents.   Rest when you are tired.  SEEK MEDICAL CARE IF:     You have knee pain that is continual and does not seem to be getting better.   SEEK IMMEDIATE MEDICAL CARE IF:   Your knee joint feels hot to the touch and you have a high fever.  MAKE SURE YOU:    Understand these instructions.   Will watch your condition.   Will get help right away if you are not doing well or get worse.  Document Released: 06/30/2007 Document Revised: 11/25/2011 Document Reviewed: 06/30/2007  [employer] Patient Information 2014 [employer].

## 2013-05-14 NOTE — Progress Notes (Signed)
  Subjective:    Patient ID: Tammie [name], female    DOB: 01/07/1935, 78 y.o.   MRN: [id number]  HPI This 78 y.o. female presents for evaluation of left knee and lower extremity Discomfort.  She has been lower extremity edema and pain in her left  Leg and knee.   Review of Systems No chest pain, SOB, HA, dizziness, vision change, N/V, diarrhea, constipation, dysuria, urinary urgency or frequency, myalgias, arthralgias or rash.     Objective:   Physical Exam Vital signs noted  Well developed well nourished female.  HEENT - Head atraumatic Normocephalic Respiratory - Lungs CTA bilateral Cardiac - RRR S1 and S2 without murmur MS - Left knee posterior tender and calf tight and tender.   Extremities - Bilateral Lower extremity edema.  Xray of left knee - Preliminary - No fx and narrowing of joint space seen    Assessment & Plan:  Calf pain, left - Plan: US Venous Img Lower Unilateral Left, DG Knee 1-2 Views Left  Left knee pain - Plan: DG Knee 1-2 Views Left.  Discussed taking otc ibuprofen and follow up next week to evaluate how she is doing.

## 2013-05-20 ENCOUNTER — Telehealth: Payer: Self-pay | Admitting: Family Medicine

## 2013-05-20 NOTE — Telephone Encounter (Signed)
Patient aware.

## 2013-06-28 ENCOUNTER — Other Ambulatory Visit: Payer: Self-pay

## 2013-06-28 MED ORDER — PAROXETINE HCL 20 MG PO TABS: 20.0000 mg | ORAL_TABLET | Freq: Every day | ORAL | Status: DC

## 2013-07-26 ENCOUNTER — Encounter: Payer: Self-pay | Admitting: Family Medicine

## 2013-07-26 ENCOUNTER — Ambulatory Visit (INDEPENDENT_AMBULATORY_CARE_PROVIDER_SITE_OTHER): Payer: Medicare Other | Admitting: Family Medicine

## 2013-07-26 VITALS — Temp 97.8°F | Ht 64.0 in | Wt 181.8 lb

## 2013-07-26 DIAGNOSIS — R7309 Other abnormal glucose: Secondary | ICD-10-CM

## 2013-07-26 DIAGNOSIS — R739 Hyperglycemia, unspecified: Secondary | ICD-10-CM

## 2013-07-26 DIAGNOSIS — F411 Generalized anxiety disorder: Secondary | ICD-10-CM

## 2013-07-26 DIAGNOSIS — I10 Essential (primary) hypertension: Secondary | ICD-10-CM

## 2013-07-26 DIAGNOSIS — Z0000 Encounter for general adult medical examination without abnormal findings: Secondary | ICD-10-CM

## 2013-07-26 LAB — POCT CBC
Granulocyte percent: 74.7 %G (ref 37–80)
HCT, POC: 45.8 % (ref 37.7–47.9)
Hemoglobin: 15.5 g/dL (ref 12.2–16.2)
Lymph, poc: 2.3 (ref 0.6–3.4)
MCH, POC: 30.7 pg (ref 27–31.2)
MCHC: 33.8 g/dL (ref 31.8–35.4)
MCV: 90.8 fL (ref 80–97)
MPV: 7.4 fL (ref 0–99.8)
POC Granulocyte: 7.2 — AB (ref 2–6.9)
POC LYMPH PERCENT: 24.2 %L (ref 10–50)
Platelet Count, POC: 278 10*3/uL (ref 142–424)
RBC: 5.1 M/uL (ref 4.04–5.48)
RDW, POC: 12.8 %
WBC: 9.7 10*3/uL (ref 4.6–10.2)

## 2013-07-26 MED ORDER — AMLODIPINE BESYLATE 10 MG PO TABS: 10.0000 mg | ORAL_TABLET | Freq: Every day | ORAL | Status: DC

## 2013-07-26 MED ORDER — PAROXETINE HCL 20 MG PO TABS: 20.0000 mg | ORAL_TABLET | Freq: Every day | ORAL | Status: DC

## 2013-07-26 NOTE — Progress Notes (Signed)
  Subjective:    Patient ID: MACARENA LANGSETH, female    DOB: 1935/03/17, 78 y.o.   MRN: [id number]  HPI This 78 y.o. female presents for evaluation of anxiety and hypertension. She is needing refills.  She is doing fine and not having any acute medical Problems.   Review of Systems No chest pain, SOB, HA, dizziness, vision change, N/V, diarrhea, constipation, dysuria, urinary urgency or frequency, myalgias, arthralgias or rash.     Objective:   Physical Exam Vital signs noted  Well developed well nourished female.  HEENT - Head atraumatic Normocephalic                Eyes - PERRLA, Conjuctiva - clear Sclera- Clear EOMI                Ears - EAC's Wnl TM's Wnl Gross Hearing WNL                Nose - Nares patent                 Throat - oropharanx wnl Respiratory - Lungs CTA bilateral Cardiac - RRR S1 and S2 without murmur GI - Abdomen soft Nontender and bowel sounds active x 4 Extremities - No edema. Neuro - Grossly intact.       Assessment & Plan:  Routine general medical examination at a health care facility - Plan: POCT CBC, CMP14+EGFR, Thyroid Panel With TSH  Generalized anxiety disorder - Plan: PARoxetine (PAXIL) 20 MG tablet  Essential hypertension, benign - Plan: amLODipine (NORVASC) 10 MG tablet BP med is increased and recommend follow up in 1 months  Deatra Canter FNP

## 2013-07-26 NOTE — Patient Instructions (Signed)

## 2013-07-27 LAB — CMP14+EGFR
ALT: 32 IU/L (ref 0–32)
AST: 34 IU/L (ref 0–40)
Albumin/Globulin Ratio: 1.5 (ref 1.1–2.5)
Albumin: 4.1 g/dL (ref 3.5–4.8)
Alkaline Phosphatase: 106 IU/L (ref 39–117)
BUN/Creatinine Ratio: 13 (ref 11–26)
BUN: 9 mg/dL (ref 8–27)
CO2: 27 mmol/L (ref 18–29)
Calcium: 10.1 mg/dL (ref 8.6–10.2)
Chloride: 98 mmol/L (ref 97–108)
Creatinine, Ser: 0.69 mg/dL (ref 0.57–1.00)
GFR calc Af Amer: 98 mL/min/{1.73_m2} (ref >59)
GFR calc non Af Amer: 85 mL/min/{1.73_m2} (ref >59)
Globulin, Total: 2.8 g/dL (ref 1.5–4.5)
Glucose: 148 mg/dL — ABNORMAL HIGH (ref 65–99)
Potassium: 3.8 mmol/L (ref 3.5–5.2)
Sodium: 140 mmol/L (ref 134–144)
Total Bilirubin: 0.4 mg/dL (ref 0.0–1.2)
Total Protein: 6.9 g/dL (ref 6.0–8.5)

## 2013-07-27 LAB — THYROID PANEL WITH TSH
Free Thyroxine Index: 1.6 (ref 1.2–4.9)
T3 Uptake Ratio: 25 % (ref 24–39)
T4, Total: 6.5 ug/dL (ref 4.5–12.0)
TSH: 1.99 u[IU]/mL (ref 0.450–4.500)

## 2013-07-28 ENCOUNTER — Telehealth: Payer: Self-pay | Admitting: *Deleted

## 2013-07-28 LAB — POCT GLYCOSYLATED HEMOGLOBIN (HGB A1C): Hemoglobin A1C: 5.4

## 2013-07-28 NOTE — Telephone Encounter (Signed)
Pt notified of lab results Verbalizes understanding 

## 2013-07-28 NOTE — Addendum Note (Signed)
Addended by: Prescott Gum on: 07/28/2013 12:14 PM   Modules accepted: Orders

## 2013-09-17 ENCOUNTER — Telehealth: Payer: Self-pay | Admitting: Family Medicine

## 2013-09-20 ENCOUNTER — Encounter: Payer: Self-pay | Admitting: Family Medicine

## 2013-09-20 ENCOUNTER — Telehealth: Payer: Self-pay | Admitting: Family Medicine

## 2013-09-20 ENCOUNTER — Ambulatory Visit (INDEPENDENT_AMBULATORY_CARE_PROVIDER_SITE_OTHER): Payer: Medicare HMO | Admitting: Family Medicine

## 2013-09-20 VITALS — BP 172/67 | HR 63 | Temp 97.2°F | Ht 64.0 in | Wt 178.6 lb

## 2013-09-20 DIAGNOSIS — J209 Acute bronchitis, unspecified: Secondary | ICD-10-CM

## 2013-09-20 DIAGNOSIS — J309 Allergic rhinitis, unspecified: Secondary | ICD-10-CM

## 2013-09-20 MED ORDER — MONTELUKAST SODIUM 10 MG PO TABS: 10.0000 mg | ORAL_TABLET | Freq: Every day | ORAL | Status: DC

## 2013-09-20 MED ORDER — BENZONATATE 100 MG PO CAPS: 100.0000 mg | ORAL_CAPSULE | Freq: Three times a day (TID) | ORAL | Status: DC | PRN

## 2013-09-20 MED ORDER — AZITHROMYCIN 250 MG PO TABS: ORAL_TABLET | ORAL | Status: DC

## 2013-09-20 NOTE — Telephone Encounter (Signed)
appt given for today 

## 2013-09-20 NOTE — Progress Notes (Signed)
   Subjective:    Patient ID: Tammie [name], female    DOB: Apr 26, 1935, 79 y.o.   MRN: [id number]  HPI This 79 y.o. female presents for evaluation of URI sx's for over a week and she would Like to get a allergic rhinitis medicine.  She has tried all other medicine and nothing works.   Review of Systems C/o uri sx's No chest pain, SOB, HA, dizziness, vision change, N/V, diarrhea, constipation, dysuria, urinary urgency or frequency, myalgias, arthralgias or rash.     Objective:   Physical Exam Vital signs noted  Well developed well nourished female.  HEENT - Head atraumatic Normocephalic                Eyes - PERRLA, Conjuctiva - clear Sclera- Clear EOMI                Ears - EAC's Wnl TM's Wnl Gross Hearing WNL                Nose - Nares boggy with decreased patency                Throat - oropharanx wnl Respiratory - Lungs CTA bilateral Cardiac - RRR S1 and S2 without murmur GI - Abdomen soft Nontender and bowel sounds active x 4 Extremities - No edema. Neuro - Grossly intact.       Assessment & Plan:  Acute bronchitis - Plan: azithromycin (ZITHROMAX) 250 MG tablet, benzonatate (TESSALON PERLES) 100 MG capsule.  Push po fluids, rest, tylenol and motrin otc prn as directed for fever, arthralgias, and myalgias.  Follow up prn if sx's continue or persist.  Allergic rhinitis - Plan: montelukast (SINGULAIR) 10 MG tablet.  Lysbeth Penner FNP

## 2013-09-20 NOTE — Telephone Encounter (Signed)
Appt given for today 

## 2013-09-20 NOTE — Patient Instructions (Signed)

## 2013-09-21 ENCOUNTER — Telehealth: Payer: Self-pay | Admitting: Family Medicine

## 2013-09-21 MED ORDER — BENZONATATE 200 MG PO CAPS: 200.0000 mg | ORAL_CAPSULE | Freq: Two times a day (BID) | ORAL | Status: DC | PRN

## 2013-09-21 NOTE — Telephone Encounter (Signed)
Patient developed a mild rash years ago after taking erythromycin. She took zithromax a couple of years ago with no reaction. Discussed with Dietrich Pates, FNP. Patient is going to take the Zpak with Benadryl and will let us know if she has any problems.  Tessalon Perles 100mg  is on backorder. Changed to 200mg .

## 2013-09-22 ENCOUNTER — Encounter: Payer: Self-pay | Admitting: Gastroenterology

## 2013-09-27 ENCOUNTER — Ambulatory Visit (INDEPENDENT_AMBULATORY_CARE_PROVIDER_SITE_OTHER): Payer: Medicare HMO

## 2013-09-27 ENCOUNTER — Encounter: Payer: Self-pay | Admitting: Family Medicine

## 2013-09-27 ENCOUNTER — Ambulatory Visit (INDEPENDENT_AMBULATORY_CARE_PROVIDER_SITE_OTHER): Payer: Medicare HMO | Admitting: Family Medicine

## 2013-09-27 VITALS — BP 176/68 | HR 66 | Temp 97.3°F | Ht 64.0 in | Wt 176.4 lb

## 2013-09-27 DIAGNOSIS — I10 Essential (primary) hypertension: Secondary | ICD-10-CM | POA: Insufficient documentation

## 2013-09-27 DIAGNOSIS — J309 Allergic rhinitis, unspecified: Secondary | ICD-10-CM | POA: Insufficient documentation

## 2013-09-27 DIAGNOSIS — F410 Panic disorder [episodic paroxysmal anxiety] without agoraphobia: Secondary | ICD-10-CM | POA: Insufficient documentation

## 2013-09-27 DIAGNOSIS — J329 Chronic sinusitis, unspecified: Secondary | ICD-10-CM

## 2013-09-27 DIAGNOSIS — R05 Cough: Secondary | ICD-10-CM

## 2013-09-27 DIAGNOSIS — K219 Gastro-esophageal reflux disease without esophagitis: Secondary | ICD-10-CM

## 2013-09-27 DIAGNOSIS — R059 Cough, unspecified: Secondary | ICD-10-CM

## 2013-09-27 DIAGNOSIS — Z85048 Personal history of other malignant neoplasm of rectum, rectosigmoid junction, and anus: Secondary | ICD-10-CM | POA: Insufficient documentation

## 2013-09-27 DIAGNOSIS — Z85038 Personal history of other malignant neoplasm of large intestine: Secondary | ICD-10-CM

## 2013-09-27 DIAGNOSIS — J45909 Unspecified asthma, uncomplicated: Secondary | ICD-10-CM | POA: Insufficient documentation

## 2013-09-27 DIAGNOSIS — J4540 Moderate persistent asthma, uncomplicated: Secondary | ICD-10-CM

## 2013-09-27 MED ORDER — SULFAMETHOXAZOLE-TMP DS 800-160 MG PO TABS: 1.0000 | ORAL_TABLET | Freq: Two times a day (BID) | ORAL | Status: DC

## 2013-09-27 MED ORDER — FLUTICASONE PROPIONATE 50 MCG/ACT NA SUSP: 2.0000 | Freq: Every day | NASAL | Status: DC

## 2013-09-27 MED ORDER — BUDESONIDE-FORMOTEROL FUMARATE 160-4.5 MCG/ACT IN AERO: 2.0000 | INHALATION_SPRAY | Freq: Two times a day (BID) | RESPIRATORY_TRACT | Status: DC

## 2013-09-27 NOTE — Progress Notes (Signed)
Patient ID: Tammie [name], female   DOB: September 29, 1934, 79 y.o.   MRN: [id number] SUBJECTIVE: CC: Chief Complaint  Patient presents with  . Acute Visit    cough says had sinus infection and completed z pack     HPI: Recently treated for sinus infection and completed Zpak.has a persistent cough. No h/o asthma. Congested cough. Past Medical History  Diagnosis Date  . Anxiety disorder   . Hemorrhoids   . GERD (gastroesophageal reflux disease)   . Hypertension   . Colorectal cancer 1997    T3, N0  . Allergic rhinitis   . Depression   . Asthma    Past Surgical History  Procedure Laterality Date  . Colectomy  1997    SIGMOID COLECTOMY 1997  . Dilation and curettage of uterus      D & C  . Tonsillectomy     History   Social History  . Marital Status: Divorced    Spouse Name: N/A    Number of Children: N/A  . Years of Education: N/A   Occupational History  . Not on file.   Social History Main Topics  . Smoking status: Never Smoker   . Smokeless tobacco: Never Used  . Alcohol Use: No  . Drug Use: No  . Sexual Activity: Not on file   Other Topics Concern  . Not on file   Social History Narrative   Lives alone.  Two children    Family History  Problem Relation Age of Onset  . Sudden death Brother     Apparent DVT/PE  . Sudden death Cousin 9    Paternal cousin   . Coronary artery disease Cousin 55    Paternal cousin, MI  . Deep vein thrombosis Mother    Current Outpatient Prescriptions on File Prior to Visit  Medication Sig Dispense Refill  . amLODipine (NORVASC) 10 MG tablet Take 1 tablet (10 mg total) by mouth daily.  90 tablet  3  . benzonatate (TESSALON) 200 MG capsule Take 1 capsule (200 mg total) by mouth 2 (two) times daily as needed for cough.  20 capsule  0  . BIOTIN PO Take by mouth.      . hydrocortisone (PROCTOZONE-HC) 2.5 % rectal cream Place rectally 2 (two) times daily.  30 g  0  . montelukast (SINGULAIR) 10 MG tablet Take 1 tablet (10 mg  total) by mouth at bedtime.  30 tablet  3  . Multiple Vitamins-Minerals (CENTRUM SILVER PO) Take by mouth. 1 TAB DAILY      . Omega-3 Fatty Acids (FISH OIL) 1200 MG CAPS Take by mouth. DOUBLE STENGTH 1200MG   ---1 CAPSULE ONCE DAILY      . PARoxetine (PAXIL) 20 MG tablet Take 1 tablet (20 mg total) by mouth daily.  90 tablet  3  . vitamin E 100 UNIT capsule Take 100 Units by mouth daily.      [name] azithromycin (ZITHROMAX) 250 MG tablet Take 2 po first day and then one po qd x 4 days  6 tablet  1  . omeprazole (PRILOSEC) 40 MG capsule Take 1 capsule (40 mg total) by mouth daily.  30 capsule  1   No current facility-administered medications on file prior to visit.   Allergies  Allergen Reactions  . Erythromycin   . Fentanyl   . Heparin     REACTION: questionable  . Hydrocodone-Acetaminophen   . Meperidine Hcl   . Penicillins   . Procaine Hcl   . Promethazine  Hcl   . Temazepam     There is no immunization history on file for this patient. Prior to Admission medications   Medication Sig Start Date End Date Taking? Authorizing Provider  amLODipine (NORVASC) 10 MG tablet Take 1 tablet (10 mg total) by mouth daily. 07/26/13  Yes Lysbeth Penner, FNP  benzonatate (TESSALON) 200 MG capsule Take 1 capsule (200 mg total) by mouth 2 (two) times daily as needed for cough. 09/21/13  Yes Lysbeth Penner, FNP  BIOTIN PO Take by mouth.   Yes Historical Provider, MD  hydrocortisone (PROCTOZONE-HC) 2.5 % rectal cream Place rectally 2 (two) times daily. 03/10/13  Yes Mary-Margaret Hassell Done, FNP  montelukast (SINGULAIR) 10 MG tablet Take 1 tablet (10 mg total) by mouth at bedtime. 09/20/13  Yes Lysbeth Penner, FNP  Multiple Vitamins-Minerals (CENTRUM SILVER PO) Take by mouth. 1 TAB DAILY   Yes Historical Provider, MD  Omega-3 Fatty Acids (FISH OIL) 1200 MG CAPS Take by mouth. DOUBLE STENGTH 1200MG   ---1 CAPSULE ONCE DAILY   Yes Historical Provider, MD  PARoxetine (PAXIL) 20 MG tablet Take 1 tablet (20 mg  total) by mouth daily. 07/26/13  Yes Lysbeth Penner, FNP  vitamin E 100 UNIT capsule Take 100 Units by mouth daily.   Yes Historical Provider, MD  azithromycin (ZITHROMAX) 250 MG tablet Take 2 po first day and then one po qd x 4 days 09/20/13   Lysbeth Penner, FNP  omeprazole (PRILOSEC) 40 MG capsule Take 1 capsule (40 mg total) by mouth daily. 09/18/11 05/14/13  Ladene Artist, MD     ROS: As above in the HPI. All other systems are stable or negative.  OBJECTIVE: APPEARANCE:  Patient in no acute distress.The patient appeared well nourished and normally developed. Acyanotic. Waist: VITAL SIGNS:BP 176/68  Pulse 66  Temp(Src) 97.3 F (36.3 C) (Oral)  Ht 5\' 4"  (1.626 m)  Wt 176 lb 6.4 oz (80.015 kg)  BMI 30.26 kg/m2  SpO2 96% Obese WF  SKIN: warm and  Dry without overt rashes, tattoos and scars  HEAD and Neck: without JVD, Head and scalp: normal Eyes:No scleral icterus. Fundi normal, eye movements normal. Ears: Auricle normal, canal normal, Tympanic membranes normal, insufflation normal. Nose: nasal congestion Throat: normal Neck & thyroid: normal  CHEST & LUNGS: Chest wall: normal Lungs: Coarse breath sounds. Prolonged expiratory phase.and expiratory wheeze with coughing.  CVS: Reveals the PMI to be normally located. Regular rhythm, First and Second Heart sounds are normal,  absence of murmurs, rubs or gallops. Peripheral vasculature: Radial pulses: normal Dorsal pedis pulses: normal Posterior pulses: normal  ABDOMEN:  Appearance: normal Benign, no organomegaly, no masses, no Abdominal Aortic enlargement. No Guarding , no rebound. No Bruits. Bowel sounds: normal  RECTAL: N/A GU: N/A  EXTREMETIES: nonedematous.  MUSCULOSKELETAL:  Spine: normal Joints: intact  NEUROLOGIC: oriented to time,place and person; nonfocal. Strength is normal Sensory is normal Reflexes are normal Cranial Nerves are normal.  ASSESSMENT: Cough - Plan: DG Chest 2  View  GERD  PERSONAL HX COLON CANCER  Hypertension  Asthma, moderate persistent, uncomplicated - Plan: budesonide-formoterol (SYMBICORT) 160-4.5 MCG/ACT inhaler  Sinusitis nasal - Plan: fluticasone (FLONASE) 50 MCG/ACT nasal spray, sulfamethoxazole-trimethoprim (BACTRIM DS) 800-160 MG per tablet  PLAN:  Orders Placed This Encounter  Procedures  . DG Chest 2 View    Standing Status: Future     Number of Occurrences: 1     Standing Expiration Date: 11/27/2014    Order Specific Question:  Reason for Exam (SYMPTOM  OR DIAGNOSIS REQUIRED)    Answer:  cough    Order Specific Question:  Preferred imaging location?    Answer:  Internal  [facility] reading (PRIMARY) by  Dr. Jacelyn Grip: no acute infiltrate.                               Meds ordered this encounter  Medications  . fluticasone (FLONASE) 50 MCG/ACT nasal spray    Sig: Place 2 sprays into both nostrils daily.    Dispense:  16 g    Refill:  2  . sulfamethoxazole-trimethoprim (BACTRIM DS) 800-160 MG per tablet    Sig: Take 1 tablet by mouth 2 (two) times daily.    Dispense:  20 tablet    Refill:  0  . budesonide-formoterol (SYMBICORT) 160-4.5 MCG/ACT inhaler    Sig: Inhale 2 puffs into the lungs 2 (two) times daily.    Dispense:  1 Inhaler    Refill:  3       Dr Paula Libra Recommendations  For nutrition information, I recommend books:  1).Eat to Live by Dr Excell Seltzer. 2).Prevent and Reverse Heart Disease by Dr Karl Luke. 3) Dr Janene Harvey Book:  Program to Reverse Diabetes  Exercise recommendations are:  If unable to walk, then the patient can exercise in a chair 3 times a day. By flapping arms like a bird gently and raising legs outwards to the front.  If ambulatory, the patient can go for walks for 30 minutes 3 times a week. Then increase the intensity and duration as tolerated.  Goal is to try to attain exercise frequency to 5 times a week.  If applicable: Best to perform resistance exercises  (machines or weights) 2 days a week and cardio type exercises 3 days per week.  Handout on Asthma, sinusitis in the AVS There are no discontinued medications. Return in about 2 weeks (around 10/11/2013) for Recheck medical problems.  Britne Borelli P. Jacelyn Grip, M.D.

## 2013-09-27 NOTE — Patient Instructions (Signed)
Asthma, Adult Asthma is a recurring condition in which the airways tighten and narrow. Asthma can make it difficult to breathe. It can cause coughing, wheezing, and shortness of breath. Asthma episodes (also called asthma attacks) range from minor to life-threatening. Asthma cannot be cured, but medicines and lifestyle changes can help control it. CAUSES Asthma is believed to be caused by inherited (genetic) and environmental factors, but its exact cause is unknown. Asthma may be triggered by allergens, lung infections, or irritants in the air. Asthma triggers are different for each person. Common triggers include:   Animal dander.  Dust mites.  Cockroaches.  Pollen from trees or grass.  Mold.  Smoke.  Air pollutants such as dust, household cleaners, hair sprays, aerosol sprays, paint fumes, strong chemicals, or strong odors.  Cold air, weather changes, and winds (which increase molds and pollens in the air).  Strong emotional expressions such as crying or laughing hard.  Stress.  Certain medicines (such as aspirin) or types of drugs (such as beta-blockers).  Sulfites in foods and drinks. Foods and drinks that may contain sulfites include dried fruit, potato chips, and sparkling grape juice.  Infections or inflammatory conditions such as the flu, a cold, or an inflammation of the nasal membranes (rhinitis).  Gastroesophageal reflux disease (GERD).  Exercise or strenuous activity. SYMPTOMS Symptoms may occur immediately after asthma is triggered or many hours later. Symptoms include:  Wheezing.  Excessive nighttime or early morning coughing.  Frequent or severe coughing with a common cold.  Chest tightness.  Shortness of breath. DIAGNOSIS  The diagnosis of asthma is made by a review of your medical history and a physical exam. Tests may also be performed. These may include:  Lung function studies. These tests show how much air you breath in and out.  Allergy  tests.  Imaging tests such as X-rays. TREATMENT  Asthma cannot be cured, but it can usually be controlled. Treatment involves identifying and avoiding your asthma triggers. It also involves medicines. There are 2 classes of medicine used for asthma treatment:   Controller medicines. These prevent asthma symptoms from occurring. They are usually taken every day.  Reliever or rescue medicines. These quickly relieve asthma symptoms. They are used as needed and provide short-term relief. Your health care provider will help you create an asthma action plan. An asthma action plan is a written plan for managing and treating your asthma attacks. It includes a list of your asthma triggers and how they may be avoided. It also includes information on when medicines should be taken and when their dosage should be changed. An action plan may also involve the use of a device called a peak flow meter. A peak flow meter measures how well the lungs are working. It helps you monitor your condition. HOME CARE INSTRUCTIONS   Take medicine as directed by your health care provider. Speak with your health care provider if you have questions about how or when to take the medicines.  Use a peak flow meter as directed by your health care provider. Record and keep track of readings.  Understand and use the action plan to help minimize or stop an asthma attack without needing to seek medical care.  Control your home environment in the following ways to help prevent asthma attacks:  Do not smoke. Avoid being exposed to secondhand smoke.  Change your heating and air conditioning filter regularly.  Limit your use of fireplaces and wood stoves.  Get rid of pests (such as roaches and   mice) and their droppings.  Throw away plants if you see mold on them.  Clean your floors and dust regularly. Use unscented cleaning products.  Try to have someone else vacuum for you regularly. Stay out of rooms while they are being  vacuumed and for a short while afterward. If you vacuum, use a dust mask from a hardware store, a double-layered or microfilter vacuum cleaner bag, or a vacuum cleaner with a HEPA filter.  Replace carpet with wood, tile, or vinyl flooring. Carpet can trap dander and dust.  Use allergy-proof pillows, mattress covers, and box spring covers.  Wash bed sheets and blankets every week in hot water and dry them in a dryer.  Use blankets that are made of polyester or cotton.  Clean bathrooms and kitchens with bleach. If possible, have someone repaint the walls in these rooms with mold-resistant paint. Keep out of the rooms that are being cleaned and painted.  Wash hands frequently. SEEK MEDICAL CARE IF:   You have wheezing, shortness of breath, or a cough even if taking medicine to prevent attacks.  The colored mucus you cough up (sputum) is thicker than usual.  Your sputum changes from clear or white to yellow, green, gray, or bloody.  You have any problems that may be related to the medicines you are taking (such as a rash, itching, swelling, or trouble breathing).  You are using a reliever medicine more than 2 3 times per week.  Your peak flow is still at 50 79% of you personal best after following your action plan for 1 hour. SEEK IMMEDIATE MEDICAL CARE IF:   You seem to be getting worse and are unresponsive to treatment during an asthma attack.  You are short of breath even at rest.  You get short of breath when doing very little physical activity.  You have difficulty eating, drinking, or talking due to asthma symptoms.  You develop chest pain.  You develop a fast heartbeat.  You have a bluish color to your lips or fingernails.  You are lightheaded, dizzy, or faint.  Your peak flow is less than 50% of your personal best.  You have a fever or persistent symptoms for more than 2 3 days.  You have a fever and symptoms suddenly get worse. MAKE SURE YOU:   Understand these  instructions.  Will watch your condition.  Will get help right away if you are not doing well or get worse. Document Released: 09/02/2005 Document Revised: 05/05/2013 Document Reviewed: 04/01/2013 [employer] Patient Information 2014 [place], [place].   Sinusitis Sinusitis is redness, soreness, and swelling (inflammation) of the paranasal sinuses. Paranasal sinuses are air pockets within the bones of your face (beneath the eyes, the middle of the forehead, or above the eyes). In healthy paranasal sinuses, mucus is able to drain out, and air is able to circulate through them by way of your nose. However, when your paranasal sinuses are inflamed, mucus and air can become trapped. This can allow bacteria and other germs to grow and cause infection. Sinusitis can develop quickly and last only a short time (acute) or continue over a long period (chronic). Sinusitis that lasts for more than 12 weeks is considered chronic.  CAUSES  Causes of sinusitis include:  Allergies.  Structural abnormalities, such as displacement of the cartilage that separates your nostrils (deviated septum), which can decrease the air flow through your nose and sinuses and affect sinus drainage.  Functional abnormalities, such as when the small hairs (cilia) that line  your sinuses and help remove mucus do not work properly or are not present. SYMPTOMS  Symptoms of acute and chronic sinusitis are the same. The primary symptoms are pain and pressure around the affected sinuses. Other symptoms include:  Upper toothache.  Earache.  Headache.  Bad breath.  Decreased sense of smell and taste.  A cough, which worsens when you are lying flat.  Fatigue.  Fever.  Thick drainage from your nose, which often is green and may contain pus (purulent).  Swelling and warmth over the affected sinuses. DIAGNOSIS  Your caregiver will perform a physical exam. During the exam, your caregiver may:  Look in your nose for signs of  abnormal growths in your nostrils (nasal polyps).  Tap over the affected sinus to check for signs of infection.  View the inside of your sinuses (endoscopy) with a special imaging device with a light attached (endoscope), which is inserted into your sinuses. If your caregiver suspects that you have chronic sinusitis, one or more of the following tests may be recommended:  Allergy tests.  Nasal culture A sample of mucus is taken from your nose and sent to a lab and screened for bacteria.  Nasal cytology A sample of mucus is taken from your nose and examined by your caregiver to determine if your sinusitis is related to an allergy. TREATMENT  Most cases of acute sinusitis are related to a viral infection and will resolve on their own within 10 days. Sometimes medicines are prescribed to help relieve symptoms (pain medicine, decongestants, nasal steroid sprays, or saline sprays).  However, for sinusitis related to a bacterial infection, your caregiver will prescribe antibiotic medicines. These are medicines that will help kill the bacteria causing the infection.  Rarely, sinusitis is caused by a fungal infection. In theses cases, your caregiver will prescribe antifungal medicine. For some cases of chronic sinusitis, surgery is needed. Generally, these are cases in which sinusitis recurs more than 3 times per year, despite other treatments. HOME CARE INSTRUCTIONS   Drink plenty of water. Water helps thin the mucus so your sinuses can drain more easily.  Use a humidifier.  Inhale steam 3 to 4 times a day (for example, sit in the bathroom with the shower running).  Apply a warm, moist washcloth to your face 3 to 4 times a day, or as directed by your caregiver.  Use saline nasal sprays to help moisten and clean your sinuses.  Take over-the-counter or prescription medicines for pain, discomfort, or fever only as directed by your caregiver. SEEK IMMEDIATE MEDICAL CARE IF:  You have increasing  pain or severe headaches.  You have nausea, vomiting, or drowsiness.  You have swelling around your face.  You have vision problems.  You have a stiff neck.  You have difficulty breathing. MAKE SURE YOU:   Understand these instructions.  Will watch your condition.  Will get help right away if you are not doing well or get worse. Document Released: 09/02/2005 Document Revised: 11/25/2011 Document Reviewed: 09/17/2011 [facility] Patient Information 2014 [place], [place].        Dr Paula Libra Recommendations  For nutrition information, I recommend books:  1).Eat to Live by Dr Excell Seltzer. 2).Prevent and Reverse Heart Disease by Dr Karl Luke. 3) Dr Janene Harvey Book:  Program to Reverse Diabetes  Exercise recommendations are:  If unable to walk, then the patient can exercise in a chair 3 times a day. By flapping arms like a bird gently and raising legs outwards to the  front.  If ambulatory, the patient can go for walks for 30 minutes 3 times a week. Then increase the intensity and duration as tolerated.  Goal is to try to attain exercise frequency to 5 times a week.  If applicable: Best to perform resistance exercises (machines or weights) 2 days a week and cardio type exercises 3 days per week.

## 2013-10-12 ENCOUNTER — Encounter: Payer: Self-pay | Admitting: Family Medicine

## 2013-10-12 ENCOUNTER — Ambulatory Visit (INDEPENDENT_AMBULATORY_CARE_PROVIDER_SITE_OTHER): Payer: Medicare HMO | Admitting: Family Medicine

## 2013-10-12 VITALS — BP 153/69 | HR 63 | Temp 97.2°F | Ht 64.0 in | Wt 180.0 lb

## 2013-10-12 DIAGNOSIS — I10 Essential (primary) hypertension: Secondary | ICD-10-CM

## 2013-10-12 DIAGNOSIS — K219 Gastro-esophageal reflux disease without esophagitis: Secondary | ICD-10-CM

## 2013-10-12 DIAGNOSIS — Z85038 Personal history of other malignant neoplasm of large intestine: Secondary | ICD-10-CM

## 2013-10-12 DIAGNOSIS — J45909 Unspecified asthma, uncomplicated: Secondary | ICD-10-CM

## 2013-10-12 MED ORDER — BUDESONIDE-FORMOTEROL FUMARATE 160-4.5 MCG/ACT IN AERO: 2.0000 | INHALATION_SPRAY | Freq: Two times a day (BID) | RESPIRATORY_TRACT | Status: DC

## 2013-10-12 NOTE — Patient Instructions (Signed)
Asthma, Adult Asthma is a recurring condition in which the airways tighten and narrow. Asthma can make it difficult to breathe. It can cause coughing, wheezing, and shortness of breath. Asthma episodes (also called asthma attacks) range from minor to life-threatening. Asthma cannot be cured, but medicines and lifestyle changes can help control it. CAUSES Asthma is believed to be caused by inherited (genetic) and environmental factors, but its exact cause is unknown. Asthma may be triggered by allergens, lung infections, or irritants in the air. Asthma triggers are different for each person. Common triggers include:   Animal dander.  Dust mites.  Cockroaches.  Pollen from trees or grass.  Mold.  Smoke.  Air pollutants such as dust, household cleaners, hair sprays, aerosol sprays, paint fumes, strong chemicals, or strong odors.  Cold air, weather changes, and winds (which increase molds and pollens in the air).  Strong emotional expressions such as crying or laughing hard.  Stress.  Certain medicines (such as aspirin) or types of drugs (such as beta-blockers).  Sulfites in foods and drinks. Foods and drinks that may contain sulfites include dried fruit, potato chips, and sparkling grape juice.  Infections or inflammatory conditions such as the flu, a cold, or an inflammation of the nasal membranes (rhinitis).  Gastroesophageal reflux disease (GERD).  Exercise or strenuous activity. SYMPTOMS Symptoms may occur immediately after asthma is triggered or many hours later. Symptoms include:  Wheezing.  Excessive nighttime or early morning coughing.  Frequent or severe coughing with a common cold.  Chest tightness.  Shortness of breath. DIAGNOSIS  The diagnosis of asthma is made by a review of your medical history and a physical exam. Tests may also be performed. These may include:  Lung function studies. These tests show how much air you breath in and out.  Allergy  tests.  Imaging tests such as X-rays. TREATMENT  Asthma cannot be cured, but it can usually be controlled. Treatment involves identifying and avoiding your asthma triggers. It also involves medicines. There are 2 classes of medicine used for asthma treatment:   Controller medicines. These prevent asthma symptoms from occurring. They are usually taken every day.  Reliever or rescue medicines. These quickly relieve asthma symptoms. They are used as needed and provide short-term relief. Your health care provider will help you create an asthma action plan. An asthma action plan is a written plan for managing and treating your asthma attacks. It includes a list of your asthma triggers and how they may be avoided. It also includes information on when medicines should be taken and when their dosage should be changed. An action plan may also involve the use of a device called a peak flow meter. A peak flow meter measures how well the lungs are working. It helps you monitor your condition. HOME CARE INSTRUCTIONS   Take medicine as directed by your health care provider. Speak with your health care provider if you have questions about how or when to take the medicines.  Use a peak flow meter as directed by your health care provider. Record and keep track of readings.  Understand and use the action plan to help minimize or stop an asthma attack without needing to seek medical care.  Control your home environment in the following ways to help prevent asthma attacks:  Do not smoke. Avoid being exposed to secondhand smoke.  Change your heating and air conditioning filter regularly.  Limit your use of fireplaces and wood stoves.  Get rid of pests (such as roaches and   mice) and their droppings.  Throw away plants if you see mold on them.  Clean your floors and dust regularly. Use unscented cleaning products.  Try to have someone else vacuum for you regularly. Stay out of rooms while they are being  vacuumed and for a short while afterward. If you vacuum, use a dust mask from a hardware store, a double-layered or microfilter vacuum cleaner bag, or a vacuum cleaner with a HEPA filter.  Replace carpet with wood, tile, or vinyl flooring. Carpet can trap dander and dust.  Use allergy-proof pillows, mattress covers, and box spring covers.  Wash bed sheets and blankets every week in hot water and dry them in a dryer.  Use blankets that are made of polyester or cotton.  Clean bathrooms and kitchens with bleach. If possible, have someone repaint the walls in these rooms with mold-resistant paint. Keep out of the rooms that are being cleaned and painted.  Wash hands frequently. SEEK MEDICAL CARE IF:   You have wheezing, shortness of breath, or a cough even if taking medicine to prevent attacks.  The colored mucus you cough up (sputum) is thicker than usual.  Your sputum changes from clear or white to yellow, green, gray, or bloody.  You have any problems that may be related to the medicines you are taking (such as a rash, itching, swelling, or trouble breathing).  You are using a reliever medicine more than 2 3 times per week.  Your peak flow is still at 50 79% of you personal best after following your action plan for 1 hour. SEEK IMMEDIATE MEDICAL CARE IF:   You seem to be getting worse and are unresponsive to treatment during an asthma attack.  You are short of breath even at rest.  You get short of breath when doing very little physical activity.  You have difficulty eating, drinking, or talking due to asthma symptoms.  You develop chest pain.  You develop a fast heartbeat.  You have a bluish color to your lips or fingernails.  You are lightheaded, dizzy, or faint.  Your peak flow is less than 50% of your personal best.  You have a fever or persistent symptoms for more than 2 3 days.  You have a fever and symptoms suddenly get worse. MAKE SURE YOU:   Understand these  instructions.  Will watch your condition.  Will get help right away if you are not doing well or get worse. Document Released: 09/02/2005 Document Revised: 05/05/2013 Document Reviewed: 04/01/2013 [employer] Patient Information 2014 [employer].  

## 2013-10-12 NOTE — Progress Notes (Signed)
Patient ID: Tammie [name], female   DOB: Dec 16, 1934, 79 y.o.   MRN: [id number] SUBJECTIVE: CC: Chief Complaint  Patient presents with  . Follow-up    2 week follow up  states she never started symbicort     HPI: Patient has been living in denial about asthma and therefore has not used the symbicort. She has been wheezing especially last night. Is very anxious about life. Cough and wheeze. No fever. No chest pains.  Past Medical History  Diagnosis Date  . Anxiety disorder   . Hemorrhoids   . GERD (gastroesophageal reflux disease)   . Hypertension   . Colorectal cancer 1997    T3, N0  . Allergic rhinitis   . Depression   . Asthma    Past Surgical History  Procedure Laterality Date  . Colectomy  1997    SIGMOID COLECTOMY 1997  . Dilation and curettage of uterus      D & C  . Tonsillectomy     History   Social History  . Marital Status: Divorced    Spouse Name: N/A    Number of Children: N/A  . Years of Education: N/A   Occupational History  . Not on file.   Social History Main Topics  . Smoking status: Never Smoker   . Smokeless tobacco: Never Used  . Alcohol Use: No  . Drug Use: No  . Sexual Activity: Not on file   Other Topics Concern  . Not on file   Social History Narrative   Lives alone.  Two children    Family History  Problem Relation Age of Onset  . Sudden death Brother     Apparent DVT/PE  . Sudden death Cousin 72    Paternal cousin   . Coronary artery disease Cousin 72    Paternal cousin, MI  . Deep vein thrombosis Mother    Current Outpatient Prescriptions on File Prior to Visit  Medication Sig Dispense Refill  . amLODipine (NORVASC) 10 MG tablet Take 1 tablet (10 mg total) by mouth daily.  90 tablet  3  . BIOTIN PO Take by mouth.      . fluticasone (FLONASE) 50 MCG/ACT nasal spray Place 2 sprays into both nostrils daily.  16 g  2  . hydrocortisone (PROCTOZONE-HC) 2.5 % rectal cream Place rectally 2 (two) times daily.  30 g  0  .  montelukast (SINGULAIR) 10 MG tablet Take 1 tablet (10 mg total) by mouth at bedtime.  30 tablet  3  . Multiple Vitamins-Minerals (CENTRUM SILVER PO) Take by mouth. 1 TAB DAILY      . Omega-3 Fatty Acids (FISH OIL) 1200 MG CAPS Take by mouth. DOUBLE STENGTH 1200MG   ---1 CAPSULE ONCE DAILY      . PARoxetine (PAXIL) 20 MG tablet Take 1 tablet (20 mg total) by mouth daily.  90 tablet  3  . vitamin E 100 UNIT capsule Take 100 Units by mouth daily.      [name] azithromycin (ZITHROMAX) 250 MG tablet Take 2 po first day and then one po qd x 4 days  6 tablet  1  . benzonatate (TESSALON) 200 MG capsule Take 1 capsule (200 mg total) by mouth 2 (two) times daily as needed for cough.  20 capsule  0  . omeprazole (PRILOSEC) 40 MG capsule Take 1 capsule (40 mg total) by mouth daily.  30 capsule  1  . sulfamethoxazole-trimethoprim (BACTRIM DS) 800-160 MG per tablet Take 1 tablet by mouth 2 (  two) times daily.  20 tablet  0   No current facility-administered medications on file prior to visit.   Allergies  Allergen Reactions  . Erythromycin   . Fentanyl   . Heparin     REACTION: questionable  . Hydrocodone-Acetaminophen   . Meperidine Hcl   . Penicillins   . Procaine Hcl   . Promethazine Hcl   . Temazepam     There is no immunization history on file for this patient. Prior to Admission medications   Medication Sig Start Date End Date Taking? Authorizing Provider  amLODipine (NORVASC) 10 MG tablet Take 1 tablet (10 mg total) by mouth daily. 07/26/13  Yes Lysbeth Penner, FNP  BIOTIN PO Take by mouth.   Yes Historical Provider, MD  budesonide-formoterol (SYMBICORT) 160-4.5 MCG/ACT inhaler Inhale 2 puffs into the lungs 2 (two) times daily. 09/27/13  Yes Vernie Shanks, MD  fluticasone (FLONASE) 50 MCG/ACT nasal spray Place 2 sprays into both nostrils daily. 09/27/13  Yes Vernie Shanks, MD  hydrocortisone (PROCTOZONE-HC) 2.5 % rectal cream Place rectally 2 (two) times daily. 03/10/13  Yes Mary-Margaret Hassell Done,  FNP  montelukast (SINGULAIR) 10 MG tablet Take 1 tablet (10 mg total) by mouth at bedtime. 09/20/13  Yes Lysbeth Penner, FNP  Multiple Vitamins-Minerals (CENTRUM SILVER PO) Take by mouth. 1 TAB DAILY   Yes Historical Provider, MD  Omega-3 Fatty Acids (FISH OIL) 1200 MG CAPS Take by mouth. DOUBLE STENGTH 1200MG   ---1 CAPSULE ONCE DAILY   Yes Historical Provider, MD  PARoxetine (PAXIL) 20 MG tablet Take 1 tablet (20 mg total) by mouth daily. 07/26/13  Yes Lysbeth Penner, FNP  vitamin E 100 UNIT capsule Take 100 Units by mouth daily.   Yes Historical Provider, MD  azithromycin (ZITHROMAX) 250 MG tablet Take 2 po first day and then one po qd x 4 days 09/20/13   Lysbeth Penner, FNP  benzonatate (TESSALON) 200 MG capsule Take 1 capsule (200 mg total) by mouth 2 (two) times daily as needed for cough. 09/21/13   Lysbeth Penner, FNP  omeprazole (PRILOSEC) 40 MG capsule Take 1 capsule (40 mg total) by mouth daily. 09/18/11 05/14/13  Ladene Artist, MD  sulfamethoxazole-trimethoprim (BACTRIM DS) 800-160 MG per tablet Take 1 tablet by mouth 2 (two) times daily. 09/27/13   Vernie Shanks, MD     ROS: As above in the HPI. All other systems are stable or negative.  OBJECTIVE: APPEARANCE:  Patient in no acute distress.The patient appeared well nourished and normally developed. Acyanotic. Waist: VITAL SIGNS:BP 153/69  Pulse 63  Temp(Src) 97.2 F (36.2 C) (Oral)  Ht 5\' 4"  (1.626 m)  Wt 180 lb (81.647 kg)  BMI 30.88 kg/m2  SpO2 93% Obese WF  SKIN: warm and  Dry without overt rashes, tattoos and scars  HEAD and Neck: without JVD, Head and scalp: normal Eyes:No scleral icterus. Fundi normal, eye movements normal. Ears: Auricle normal, canal normal, Tympanic membranes normal, insufflation normal. Nose: normal Throat: normal Neck & thyroid: normal  CHEST & LUNGS: Chest wall: normal Lungs: Coarse bs. scattered wheezes and rhonchi with poor airflow.no rales.   CVS: Reveals the PMI to be normally  located. Regular rhythm, First and Second Heart sounds are normal,  absence of murmurs, rubs or gallops. Peripheral vasculature: Radial pulses: normal  ABDOMEN:  Appearance: normal Benign, no organomegaly, no masses, no Abdominal Aortic enlargement. No Guarding , no rebound. No Bruits. Bowel sounds: normal  RECTAL: N/A GU: N/A  EXTREMETIES: nonedematous.  MUSCULOSKELETAL:  Spine: normal Joints: intact  NEUROLOGIC: oriented to time,place and person; nonfocal. Strength is normal Sensory is normal Reflexes are normal Cranial Nerves are normal.  ASSESSMENT: Asthma  Hypertension  PERSONAL HX COLON CANCER  GERD  PLAN: 1/2 hour counselling and education on stress management and anxiety management. Also, demonstrated use of the inhaler and counselled extensively on asthma and use of the inhaler.  No orders of the defined types were placed in this encounter.   Meds ordered this encounter  Medications  . budesonide-formoterol (SYMBICORT) 160-4.5 MCG/ACT inhaler    Sig: Inhale 2 puffs into the lungs 2 (two) times daily.    Dispense:  1 Inhaler    Refill:  3   Medications Discontinued During This Encounter  Medication Reason  . budesonide-formoterol (SYMBICORT) 160-4.5 MCG/ACT inhaler Reorder   Return in about 2 weeks (around 10/26/2013) for recheck asthma.  Davonne Jarnigan P. Jacelyn Grip, M.D.

## 2013-10-20 ENCOUNTER — Telehealth: Payer: Self-pay | Admitting: Gastroenterology

## 2013-10-20 ENCOUNTER — Telehealth: Payer: Self-pay | Admitting: Family Medicine

## 2013-10-20 NOTE — Telephone Encounter (Signed)
Patient states she needs her omeprazole refilled. Reminded patient that we spoke last January about getting refills through her PCP instead of [place] to make it easier on her. Told patient that instead of coming to see [place] for just yearly refills that if she sees her PCP more often she can get refills through them. Patient states she just forgot that we had this conversation and remembers now. The patient states she will call her PCP for refills and that would be much better for her. Told her I will send this note to her PCP also so they will know for when she calls there office.

## 2013-10-21 ENCOUNTER — Other Ambulatory Visit: Payer: Self-pay | Admitting: Family Medicine

## 2013-10-21 MED ORDER — OMEPRAZOLE 40 MG PO CPDR: 40.0000 mg | DELAYED_RELEASE_CAPSULE | Freq: Every day | ORAL | Status: DC

## 2013-10-21 NOTE — Telephone Encounter (Signed)
PT AWARE TO CALL PHARMACY

## 2013-10-29 ENCOUNTER — Ambulatory Visit: Payer: Medicare HMO | Admitting: Family Medicine

## 2013-11-02 ENCOUNTER — Ambulatory Visit: Payer: Medicare HMO | Admitting: Family Medicine

## 2013-11-11 ENCOUNTER — Ambulatory Visit: Payer: Medicare HMO | Admitting: Family Medicine

## 2013-11-15 ENCOUNTER — Ambulatory Visit: Payer: Medicare Other | Admitting: Gastroenterology

## 2013-11-17 ENCOUNTER — Other Ambulatory Visit: Payer: Self-pay

## 2013-11-17 MED ORDER — OMEPRAZOLE 40 MG PO CPDR: 40.0000 mg | DELAYED_RELEASE_CAPSULE | Freq: Every day | ORAL | Status: DC

## 2013-12-03 ENCOUNTER — Encounter: Payer: Self-pay | Admitting: Family Medicine

## 2013-12-03 ENCOUNTER — Ambulatory Visit (INDEPENDENT_AMBULATORY_CARE_PROVIDER_SITE_OTHER): Payer: Medicare HMO | Admitting: Family Medicine

## 2013-12-03 VITALS — BP 178/76 | HR 62 | Temp 97.0°F | Ht 64.0 in | Wt 181.0 lb

## 2013-12-03 DIAGNOSIS — Z85038 Personal history of other malignant neoplasm of large intestine: Secondary | ICD-10-CM

## 2013-12-03 DIAGNOSIS — J45909 Unspecified asthma, uncomplicated: Secondary | ICD-10-CM

## 2013-12-03 DIAGNOSIS — I10 Essential (primary) hypertension: Secondary | ICD-10-CM

## 2013-12-03 DIAGNOSIS — K219 Gastro-esophageal reflux disease without esophagitis: Secondary | ICD-10-CM

## 2013-12-03 MED ORDER — LOSARTAN POTASSIUM 50 MG PO TABS
50.0000 mg | ORAL_TABLET | Freq: Every day | ORAL | Status: DC
Start: 2013-12-03 — End: 2013-12-27

## 2013-12-03 NOTE — Progress Notes (Signed)
Patient ID: Tammie [name], female   DOB: 26-May-1935, 79 y.o.   MRN: [id number] SUBJECTIVE: CC: Chief Complaint  Patient presents with  . Follow-up    2 week reck says she is better and thinks the inhaler makes her bp go up     HPI: 1) asthma better , no more wheezingand has accepted the fact that she is asthmatic.  2)Patient is here for follow up of hypertension: denies Headache;deniesChest Pain;denies weakness;denies Shortness of Breath or Orthopnea;denies Visual changes;denies palpitations;denies cough;denies pedal edema;denies symptoms of TIA or stroke; admits to Compliance with medications. Only on Norvasc and had assumed that her high BPs were due to stress of illness when she comes , however her BPs at home systolic has been [id number]. denies Problems with medications.  3)anxiety and  Depression. Stable.  Past Medical History  Diagnosis Date  . Anxiety disorder   . Hemorrhoids   . GERD (gastroesophageal reflux disease)   . Hypertension   . Colorectal cancer 1997    T3, N0  . Allergic rhinitis   . Depression   . Asthma    Past Surgical History  Procedure Laterality Date  . Colectomy  1997    SIGMOID COLECTOMY 1997  . Dilation and curettage of uterus      D & C  . Tonsillectomy     History   Social History  . Marital Status: Divorced    Spouse Name: N/A    Number of Children: N/A  . Years of Education: N/A   Occupational History  . Not on file.   Social History Main Topics  . Smoking status: Never Smoker   . Smokeless tobacco: Never Used  . Alcohol Use: No  . Drug Use: No  . Sexual Activity: Not on file   Other Topics Concern  . Not on file   Social History Narrative   Lives alone.  Two children    Family History  Problem Relation Age of Onset  . Sudden death Brother     Apparent DVT/PE  . Sudden death Cousin 78    Paternal cousin   . Coronary artery disease Cousin 60    Paternal cousin, MI  . Deep vein thrombosis Mother    Current Outpatient  Prescriptions on File Prior to Visit  Medication Sig Dispense Refill  . amLODipine (NORVASC) 10 MG tablet Take 1 tablet (10 mg total) by mouth daily.  90 tablet  3  . BIOTIN PO Take by mouth.      . budesonide-formoterol (SYMBICORT) 160-4.5 MCG/ACT inhaler Inhale 2 puffs into the lungs 2 (two) times daily.  1 Inhaler  3  . fluticasone (FLONASE) 50 MCG/ACT nasal spray Place 2 sprays into both nostrils daily.  16 g  2  . hydrocortisone (PROCTOZONE-HC) 2.5 % rectal cream Place rectally 2 (two) times daily.  30 g  0  . Multiple Vitamins-Minerals (CENTRUM SILVER PO) Take by mouth. 1 TAB DAILY      . Omega-3 Fatty Acids (FISH OIL) 1200 MG CAPS Take by mouth. DOUBLE STENGTH 1200MG   ---1 CAPSULE ONCE DAILY      . omeprazole (PRILOSEC) 40 MG capsule Take 1 capsule (40 mg total) by mouth daily.  30 capsule  2  . PARoxetine (PAXIL) 20 MG tablet Take 1 tablet (20 mg total) by mouth daily.  90 tablet  3  . vitamin E 100 UNIT capsule Take 100 Units by mouth daily.      [name] azithromycin (ZITHROMAX) 250 MG tablet Take 2  po first day and then one po qd x 4 days  6 tablet  1  . benzonatate (TESSALON) 200 MG capsule Take 1 capsule (200 mg total) by mouth 2 (two) times daily as needed for cough.  20 capsule  0  . montelukast (SINGULAIR) 10 MG tablet Take 1 tablet (10 mg total) by mouth at bedtime.  30 tablet  3  . sulfamethoxazole-trimethoprim (BACTRIM DS) 800-160 MG per tablet Take 1 tablet by mouth 2 (two) times daily.  20 tablet  0   No current facility-administered medications on file prior to visit.   Allergies  Allergen Reactions  . Erythromycin   . Fentanyl   . Heparin     REACTION: questionable  . Hydrocodone-Acetaminophen   . Meperidine Hcl   . Penicillins   . Procaine Hcl   . Promethazine Hcl   . Temazepam     There is no immunization history on file for this patient. Prior to Admission medications   Medication Sig Start Date End Date Taking? Authorizing Provider  amLODipine (NORVASC) 10 MG  tablet Take 1 tablet (10 mg total) by mouth daily. 07/26/13  Yes Lysbeth Penner, FNP  BIOTIN PO Take by mouth.   Yes Historical Provider, MD  budesonide-formoterol (SYMBICORT) 160-4.5 MCG/ACT inhaler Inhale 2 puffs into the lungs 2 (two) times daily. 10/12/13  Yes Vernie Shanks, MD  fluticasone (FLONASE) 50 MCG/ACT nasal spray Place 2 sprays into both nostrils daily. 09/27/13  Yes Vernie Shanks, MD  hydrocortisone (PROCTOZONE-HC) 2.5 % rectal cream Place rectally 2 (two) times daily. 03/10/13  Yes Mary-Margaret Hassell Done, FNP  Multiple Vitamins-Minerals (CENTRUM SILVER PO) Take by mouth. 1 TAB DAILY   Yes Historical Provider, MD  Omega-3 Fatty Acids (FISH OIL) 1200 MG CAPS Take by mouth. DOUBLE STENGTH 1200MG   ---1 CAPSULE ONCE DAILY   Yes Historical Provider, MD  omeprazole (PRILOSEC) 40 MG capsule Take 1 capsule (40 mg total) by mouth daily. 11/17/13 07/14/15 Yes Vernie Shanks, MD  PARoxetine (PAXIL) 20 MG tablet Take 1 tablet (20 mg total) by mouth daily. 07/26/13  Yes Lysbeth Penner, FNP  vitamin E 100 UNIT capsule Take 100 Units by mouth daily.   Yes Historical Provider, MD  azithromycin (ZITHROMAX) 250 MG tablet Take 2 po first day and then one po qd x 4 days 09/20/13   Lysbeth Penner, FNP  benzonatate (TESSALON) 200 MG capsule Take 1 capsule (200 mg total) by mouth 2 (two) times daily as needed for cough. 09/21/13   Lysbeth Penner, FNP  montelukast (SINGULAIR) 10 MG tablet Take 1 tablet (10 mg total) by mouth at bedtime. 09/20/13   Lysbeth Penner, FNP  sulfamethoxazole-trimethoprim (BACTRIM DS) 800-160 MG per tablet Take 1 tablet by mouth 2 (two) times daily. 09/27/13   Vernie Shanks, MD     ROS: As above in the HPI. All other systems are stable or negative.  OBJECTIVE: APPEARANCE:  Patient in no acute distress.The patient appeared well nourished and normally developed. Acyanotic. Waist: VITAL SIGNS:BP 178/76  Pulse 62  Temp(Src) 97 F (36.1 C) (Oral)  Ht 5\' 4"  (1.626 m)  Wt 181  lb (82.101 kg)  BMI 31.05 kg/m2  SpO2 94% WF obese  SKIN: warm and  Dry without overt rashes, tattoos and scars  HEAD and Neck: without JVD, Head and scalp: normal Eyes:No scleral icterus. Fundi normal, eye movements normal. Ears: Auricle normal, canal normal, Tympanic membranes normal, insufflation normal. Nose: normal Throat: normal Neck &  thyroid: normal  CHEST & LUNGS: Chest wall: normal Lungs: Clear  CVS: Reveals the PMI to be normally located. Regular rhythm, First and Second Heart sounds are normal,  absence of murmurs, rubs or gallops. Peripheral vasculature: Radial pulses: normal Dorsal pedis pulses: normal Posterior pulses: normal  ABDOMEN:  Appearance:obese Benign, no organomegaly, no masses, no Abdominal Aortic enlargement. No Guarding , no rebound. No Bruits. Bowel sounds: normal  RECTAL: N/A GU: N/A  EXTREMETIES: nonedematous.  MUSCULOSKELETAL:  Spine: normal Joints: intact  NEUROLOGIC: oriented to time,place and person; nonfocal. Strength is normal Sensory is normal Reflexes are normal Cranial Nerves are normal.   ASSESSMENT: Asthma  Hypertension - Plan: losartan (COZAAR) 50 MG tablet  GERD  PERSONAL HX COLON CANCER Asthma better BP not at goal.   PLAN: Coping with her anxieties discussed. Review asthma with patient DASH Diet discussed incraesin vegetables and fruits in the diet Attempt to get BP to goal Weight reduction diet and weight loss and exercise.  No orders of the defined types were placed in this encounter.   Meds ordered this encounter  Medications  . losartan (COZAAR) 50 MG tablet    Sig: Take 1 tablet (50 mg total) by mouth daily.    Dispense:  30 tablet    Refill:  5   There are no discontinued medications. Return in about 4 weeks (around 12/31/2013) for recheck BP.  Chenise Mulvihill P. Jacelyn Grip, M.D.

## 2013-12-23 ENCOUNTER — Telehealth: Payer: Self-pay | Admitting: Family Medicine

## 2013-12-23 NOTE — Telephone Encounter (Signed)
Appointment scheduled with Dr Jacelyn Grip for 12/27/13. She is taking Mucinex and cough is more productive now compared to the dry cough that she had before. She is also using Symbicort and Flonase.  Suggested Delsym to help with persistent cough.  She is unable to take tessalon perles due to side effect of headache.

## 2013-12-27 ENCOUNTER — Ambulatory Visit (INDEPENDENT_AMBULATORY_CARE_PROVIDER_SITE_OTHER): Payer: Medicare HMO

## 2013-12-27 ENCOUNTER — Ambulatory Visit (INDEPENDENT_AMBULATORY_CARE_PROVIDER_SITE_OTHER): Payer: Medicare HMO | Admitting: Family Medicine

## 2013-12-27 ENCOUNTER — Encounter: Payer: Self-pay | Admitting: Family Medicine

## 2013-12-27 VITALS — BP 164/68 | HR 60 | Temp 97.4°F | Ht 64.0 in | Wt 178.8 lb

## 2013-12-27 DIAGNOSIS — R05 Cough: Secondary | ICD-10-CM

## 2013-12-27 DIAGNOSIS — J309 Allergic rhinitis, unspecified: Secondary | ICD-10-CM

## 2013-12-27 DIAGNOSIS — Z13220 Encounter for screening for lipoid disorders: Secondary | ICD-10-CM

## 2013-12-27 DIAGNOSIS — Z85038 Personal history of other malignant neoplasm of large intestine: Secondary | ICD-10-CM

## 2013-12-27 DIAGNOSIS — K219 Gastro-esophageal reflux disease without esophagitis: Secondary | ICD-10-CM

## 2013-12-27 DIAGNOSIS — I10 Essential (primary) hypertension: Secondary | ICD-10-CM

## 2013-12-27 DIAGNOSIS — R059 Cough, unspecified: Secondary | ICD-10-CM

## 2013-12-27 DIAGNOSIS — J45909 Unspecified asthma, uncomplicated: Secondary | ICD-10-CM

## 2013-12-27 MED ORDER — TRIAMTERENE-HCTZ 37.5-25 MG PO CAPS: 1.0000 | ORAL_CAPSULE | Freq: Every day | ORAL | Status: DC

## 2013-12-27 NOTE — Progress Notes (Signed)
Patient ID: Tammie [name], female   DOB: 07/25/1935, 79 y.o.   MRN: [id number] SUBJECTIVE: CC: Chief Complaint  Patient presents with  . Follow-up    1 month follow up asthma states she feels tired from BP med and has cough     HPI:  Patient is here for follow up of hypertension: denies Headache;deniesChest Pain;denies weakness;denies Shortness of Breath or Orthopnea;denies Visual changes;denies palpitations;denies cough;denies pedal edema;denies symptoms of TIA or stroke; admits to Compliance with medications. denies Problems with medications.  Also, here for follow up on asthma.still has a residual cough. No wheezes.no fever. No chest pain.   Has GERD symptoms at times.  Past Medical History  Diagnosis Date  . Anxiety disorder   . Hemorrhoids   . GERD (gastroesophageal reflux disease)   . Hypertension   . Colorectal cancer 1997    T3, N0  . Allergic rhinitis   . Depression   . Asthma    Past Surgical History  Procedure Laterality Date  . Colectomy  1997    SIGMOID COLECTOMY 1997  . Dilation and curettage of uterus      D & C  . Tonsillectomy     History   Social History  . Marital Status: Divorced    Spouse Name: N/A    Number of Children: N/A  . Years of Education: N/A   Occupational History  . Not on file.   Social History Main Topics  . Smoking status: Never Smoker   . Smokeless tobacco: Never Used  . Alcohol Use: No  . Drug Use: No  . Sexual Activity: Not on file   Other Topics Concern  . Not on file   Social History Narrative   Lives alone.  Two children    Family History  Problem Relation Age of Onset  . Sudden death Brother     Apparent DVT/PE  . Sudden death Cousin 66    Paternal cousin   . Coronary artery disease Cousin 53    Paternal cousin, MI  . Deep vein thrombosis Mother    Current Outpatient Prescriptions on File Prior to Visit  Medication Sig Dispense Refill  . BIOTIN PO Take by mouth.      . budesonide-formoterol  (SYMBICORT) 160-4.5 MCG/ACT inhaler Inhale 2 puffs into the lungs 2 (two) times daily.  1 Inhaler  3  . fluticasone (FLONASE) 50 MCG/ACT nasal spray Place 2 sprays into both nostrils daily.  16 g  2  . hydrocortisone (PROCTOZONE-HC) 2.5 % rectal cream Place rectally 2 (two) times daily.  30 g  0  . montelukast (SINGULAIR) 10 MG tablet Take 1 tablet (10 mg total) by mouth at bedtime.  30 tablet  3  . Multiple Vitamins-Minerals (CENTRUM SILVER PO) Take by mouth. 1 TAB DAILY      . Omega-3 Fatty Acids (FISH OIL) 1200 MG CAPS Take by mouth. DOUBLE STENGTH $RemoveBefore'1200MG'[id number]$   ---1 CAPSULE ONCE DAILY      . omeprazole (PRILOSEC) 40 MG capsule Take 1 capsule (40 mg total) by mouth daily.  30 capsule  2  . PARoxetine (PAXIL) 20 MG tablet Take 1 tablet (20 mg total) by mouth daily.  90 tablet  3  . vitamin E 100 UNIT capsule Take 100 Units by mouth daily.      [name] azithromycin (ZITHROMAX) 250 MG tablet Take 2 po first day and then one po qd x 4 days  6 tablet  1  . benzonatate (TESSALON) 200 MG capsule Take 1  capsule (200 mg total) by mouth 2 (two) times daily as needed for cough.  20 capsule  0  . sulfamethoxazole-trimethoprim (BACTRIM DS) 800-160 MG per tablet Take 1 tablet by mouth 2 (two) times daily.  20 tablet  0   No current facility-administered medications on file prior to visit.   Allergies  Allergen Reactions  . Erythromycin   . Fentanyl   . Heparin     REACTION: questionable  . Hydrocodone-Acetaminophen   . Meperidine Hcl   . Penicillins   . Procaine Hcl   . Promethazine Hcl   . Temazepam     There is no immunization history on file for this patient. Prior to Admission medications   Medication Sig Start Date End Date Taking? Authorizing Provider  amLODipine (NORVASC) 10 MG tablet Take 1 tablet (10 mg total) by mouth daily. 07/26/13  Yes Lysbeth Penner, FNP  BIOTIN PO Take by mouth.   Yes Historical Provider, MD  budesonide-formoterol (SYMBICORT) 160-4.5 MCG/ACT inhaler Inhale 2 puffs into  the lungs 2 (two) times daily. 10/12/13  Yes Vernie Shanks, MD  fluticasone (FLONASE) 50 MCG/ACT nasal spray Place 2 sprays into both nostrils daily. 09/27/13  Yes Vernie Shanks, MD  hydrocortisone (PROCTOZONE-HC) 2.5 % rectal cream Place rectally 2 (two) times daily. 03/10/13  Yes Mary-Margaret Hassell Done, FNP  losartan (COZAAR) 50 MG tablet Take 1 tablet (50 mg total) by mouth daily. 12/03/13  Yes Vernie Shanks, MD  montelukast (SINGULAIR) 10 MG tablet Take 1 tablet (10 mg total) by mouth at bedtime. 09/20/13  Yes Lysbeth Penner, FNP  Multiple Vitamins-Minerals (CENTRUM SILVER PO) Take by mouth. 1 TAB DAILY   Yes Historical Provider, MD  Omega-3 Fatty Acids (FISH OIL) 1200 MG CAPS Take by mouth. DOUBLE STENGTH $RemoveBefore'1200MG'[id number]$   ---1 CAPSULE ONCE DAILY   Yes Historical Provider, MD  omeprazole (PRILOSEC) 40 MG capsule Take 1 capsule (40 mg total) by mouth daily. 11/17/13 07/14/15 Yes Vernie Shanks, MD  PARoxetine (PAXIL) 20 MG tablet Take 1 tablet (20 mg total) by mouth daily. 07/26/13  Yes Lysbeth Penner, FNP  vitamin E 100 UNIT capsule Take 100 Units by mouth daily.   Yes Historical Provider, MD  azithromycin (ZITHROMAX) 250 MG tablet Take 2 po first day and then one po qd x 4 days 09/20/13   Lysbeth Penner, FNP  benzonatate (TESSALON) 200 MG capsule Take 1 capsule (200 mg total) by mouth 2 (two) times daily as needed for cough. 09/21/13   Lysbeth Penner, FNP  sulfamethoxazole-trimethoprim (BACTRIM DS) 800-160 MG per tablet Take 1 tablet by mouth 2 (two) times daily. 09/27/13   Vernie Shanks, MD     ROS: As above in the HPI. All other systems are stable or negative.  OBJECTIVE: APPEARANCE:  Patient in no acute distress.The patient appeared well nourished and normally developed. Acyanotic. Waist: VITAL SIGNS:BP 164/68  Pulse 60  Temp(Src) 97.4 F (36.3 C) (Oral)  Ht $R'5\' 4"'KP$  (1.626 m)  Wt 178 lb 12.8 oz (81.103 kg)  BMI 30.68 kg/m2  SpO2 95%   SKIN: warm and  Dry without overt rashes, tattoos and  scars  HEAD and Neck: without JVD, Head and scalp: normal Eyes:No scleral icterus. Fundi normal, eye movements normal. Ears: Auricle normal, canal normal, Tympanic membranes normal, insufflation normal. Nose: normal Throat: normal Neck & thyroid: normal  CHEST & LUNGS: Chest wall: normal Lungs: Clear  CVS: Reveals the PMI to be normally located. Regular rhythm, First and  Second Heart sounds are normal,  absence of murmurs, rubs or gallops. Peripheral vasculature: Radial pulses: normal Dorsal pedis pulses: normal Posterior pulses: normal  ABDOMEN:  Appearance: normal Benign, no organomegaly, no masses, no Abdominal Aortic enlargement. No Guarding , no rebound. No Bruits. Bowel sounds: normal  RECTAL: N/A GU: N/A  EXTREMETIES: nonedematous.  MUSCULOSKELETAL:  Spine: normal Joints: intact  NEUROLOGIC: oriented to time,place and person; nonfocal. Strength is normal Sensory is normal Reflexes are normal Cranial Nerves are normal.  ASSESSMENT: Cough - Plan: DG Chest 2 View, CMP14+EGFR, POCT CBC, Brain natriuretic peptide  PERSONAL HX COLON CANCER  Hypertension - Plan: amLODipine (NORVASC) 10 MG tablet, triamterene-hydrochlorothiazide (DYAZIDE) 37.5-25 MG per capsule  Asthma  GERD  Allergic rhinitis  Screening for cholesterol level - Plan: Lipid panel persisitent cough and orthopnea and GERD symptoms.  PLAN:  GERD and DASH diet handout in the AVS.  Orders Placed This Encounter  Procedures  . DG Chest 2 View    Standing Status: Future     Number of Occurrences: 1     Standing Expiration Date: 02/27/2015    Order Specific Question:  Reason for Exam (SYMPTOM  OR DIAGNOSIS REQUIRED)    Answer:  cough    Order Specific Question:  Preferred imaging location?    Answer:  Internal  . CMP14+EGFR    Standing Status: Future     Number of Occurrences:      Standing Expiration Date: 12/28/2014    Order Specific Question:  Has the patient fasted?    Answer:   Yes  . Lipid panel    Standing Status: Future     Number of Occurrences:      Standing Expiration Date: 12/28/2014    Order Specific Question:  Has the patient fasted?    Answer:  Yes  . Brain natriuretic peptide    Standing Status: Future     Number of Occurrences:      Standing Expiration Date: 12/28/2014  . POCT CBC    Standing Status: Future     Number of Occurrences:      Standing Expiration Date: 01/26/2014   Meds ordered this encounter  Medications  . DISCONTD: amLODipine (NORVASC) 10 MG tablet    Sig: Take 5 mg by mouth daily.  [name] amLODipine (NORVASC) 10 MG tablet    Sig: Take 1 tablet (10 mg total) by mouth daily.    Dispense:  30 tablet    Refill:  0  . triamterene-hydrochlorothiazide (DYAZIDE) 37.5-25 MG per capsule    Sig: Take 1 each (1 capsule total) by mouth daily.    Dispense:  30 capsule    Refill:  5   Medications Discontinued During This Encounter  Medication Reason  . amLODipine (NORVASC) 10 MG tablet   . losartan (COZAAR) 50 MG tablet Side effect (s)  . amLODipine (NORVASC) 10 MG tablet Reorder   Return in about 2 weeks (around 01/10/2014) for Recheck medical problems.  Yorley Buch P. Jacelyn Grip, M.D.

## 2013-12-27 NOTE — Patient Instructions (Addendum)
Gastroesophageal Reflux Disease, Adult Gastroesophageal reflux disease (GERD) happens when acid from your stomach flows up into the esophagus. When acid comes in contact with the esophagus, the acid causes soreness (inflammation) in the esophagus. Over time, GERD may create small holes (ulcers) in the lining of the esophagus. CAUSES   Increased body weight. This puts pressure on the stomach, making acid rise from the stomach into the esophagus.  Smoking. This increases acid production in the stomach.  Drinking alcohol. This causes decreased pressure in the lower esophageal sphincter (valve or ring of muscle between the esophagus and stomach), allowing acid from the stomach into the esophagus.  Late evening meals and a full stomach. This increases pressure and acid production in the stomach.  A malformed lower esophageal sphincter. Sometimes, no cause is found. SYMPTOMS   Burning pain in the lower part of the mid-chest behind the breastbone and in the mid-stomach area. This may occur twice a week or more often.  Trouble swallowing.  Sore throat.  Dry cough.  Asthma-like symptoms including chest tightness, shortness of breath, or wheezing. DIAGNOSIS  Your caregiver may be able to diagnose GERD based on your symptoms. In some cases, X-rays and other tests may be done to check for complications or to check the condition of your stomach and esophagus. TREATMENT  Your caregiver may recommend over-the-counter or prescription medicines to help decrease acid production. Ask your caregiver before starting or adding any new medicines.  HOME CARE INSTRUCTIONS   Change the factors that you can control. Ask your caregiver for guidance concerning weight loss, quitting smoking, and alcohol consumption.  Avoid foods and drinks that make your symptoms worse, such as:  Caffeine or alcoholic drinks.  Chocolate.  Peppermint or mint flavorings.  Garlic and onions.  Spicy foods.  Citrus fruits,  such as oranges, lemons, or limes.  Tomato-based foods such as sauce, chili, salsa, and pizza.  Fried and fatty foods.  Avoid lying down for the 3 hours prior to your bedtime or prior to taking a nap.  Eat small, frequent meals instead of large meals.  Wear loose-fitting clothing. Do not wear anything tight around your waist that causes pressure on your stomach.  Raise the head of your bed 6 to 8 inches with wood blocks to help you sleep. Extra pillows will not help.  Only take over-the-counter or prescription medicines for pain, discomfort, or fever as directed by your caregiver.  Do not take aspirin, ibuprofen, or other nonsteroidal anti-inflammatory drugs (NSAIDs). SEEK IMMEDIATE MEDICAL CARE IF:   You have pain in your arms, neck, jaw, teeth, or back.  Your pain increases or changes in intensity or duration.  You develop nausea, vomiting, or sweating (diaphoresis).  You develop shortness of breath, or you faint.  Your vomit is green, yellow, black, or looks like coffee grounds or blood.  Your stool is red, bloody, or black. These symptoms could be signs of other problems, such as heart disease, gastric bleeding, or esophageal bleeding. MAKE SURE YOU:   Understand these instructions.  Will watch your condition.  Will get help right away if you are not doing well or get worse. Document Released: 06/12/2005 Document Revised: 11/25/2011 Document Reviewed: 03/22/2011 [facility] Patient Information 2014 [place], [place].   DASH Diet The DASH diet stands for "Dietary Approaches to Stop Hypertension." It is a healthy eating plan that has been shown to reduce high blood pressure (hypertension) in as little as 14 days, while also possibly providing other significant health benefits. These  other health benefits include reducing the risk of breast cancer after menopause and reducing the risk of type 2 diabetes, heart disease, colon cancer, and stroke. Health benefits also include  weight loss and slowing kidney failure in patients with chronic kidney disease.  DIET GUIDELINES  Limit salt (sodium). Your diet should contain less than 1500 mg of sodium daily.  Limit refined or processed carbohydrates. Your diet should include mostly whole grains. Desserts and added sugars should be used sparingly.  Include small amounts of heart-healthy fats. These types of fats include nuts, oils, and tub margarine. Limit saturated and trans fats. These fats have been shown to be harmful in the body. CHOOSING FOODS  The following food groups are based on a 2000 calorie diet. See your Registered Dietitian for individual calorie needs. Grains and Grain Products (6 to 8 servings daily)  Eat More Often: Whole-wheat bread, brown rice, whole-grain or wheat pasta, quinoa, popcorn without added fat or salt (air popped).  Eat Less Often: White bread, white pasta, white rice, cornbread. Vegetables (4 to 5 servings daily)  Eat More Often: Fresh, frozen, and canned vegetables. Vegetables may be raw, steamed, roasted, or grilled with a minimal amount of fat.  Eat Less Often/Avoid: Creamed or fried vegetables. Vegetables in a cheese sauce. Fruit (4 to 5 servings daily)  Eat More Often: All fresh, canned (in natural juice), or frozen fruits. Dried fruits without added sugar. One hundred percent fruit juice ( cup [237 mL] daily).  Eat Less Often: Dried fruits with added sugar. Canned fruit in light or heavy syrup. [employer], Fish, and Poultry (2 servings or less daily. One serving is 3 to 4 oz [85-114 g]).  Eat More Often: Ninety percent or leaner ground beef, tenderloin, sirloin. Round cuts of beef, chicken breast, [place] breast. All fish. Grill, bake, or broil your meat. Nothing should be fried.  Eat Less Often/Avoid: Fatty cuts of meat, [place], or chicken leg, thigh, or wing. Fried cuts of meat or fish. Dairy (2 to 3 servings)  Eat More Often: Low-fat or fat-free milk, low-fat plain or  light yogurt, reduced-fat or part-skim cheese.  Eat Less Often/Avoid: Milk (whole, 2%).Whole milk yogurt. Full-fat cheeses. Nuts, Seeds, and Legumes (4 to 5 servings per week)  Eat More Often: All without added salt.  Eat Less Often/Avoid: Salted nuts and seeds, canned beans with added salt. Fats and Sweets (limited)  Eat More Often: Vegetable oils, tub margarines without trans fats, sugar-free gelatin. Mayonnaise and salad dressings.  Eat Less Often/Avoid: Coconut oils, palm oils, butter, stick margarine, cream, half and half, cookies, candy, pie. FOR MORE INFORMATION The Dash Diet Eating Plan: www.dashdiet.org Document Released: 08/22/2011 Document Revised: 11/25/2011 Document Reviewed: 08/22/2011 [facility] Patient Information 2014 [place], [place].

## 2013-12-28 ENCOUNTER — Other Ambulatory Visit (INDEPENDENT_AMBULATORY_CARE_PROVIDER_SITE_OTHER): Payer: Medicare HMO

## 2013-12-28 DIAGNOSIS — Z13220 Encounter for screening for lipoid disorders: Secondary | ICD-10-CM

## 2013-12-28 DIAGNOSIS — R05 Cough: Secondary | ICD-10-CM

## 2013-12-28 DIAGNOSIS — R059 Cough, unspecified: Secondary | ICD-10-CM

## 2013-12-28 NOTE — Progress Notes (Signed)
Pt came in for lab  only 

## 2013-12-28 NOTE — Addendum Note (Signed)
Addended by: Pollyann Kennedy F on: 12/28/2013 12:06 PM   Modules accepted: Orders

## 2013-12-29 LAB — CBC WITH DIFFERENTIAL

## 2013-12-29 LAB — CMP14+EGFR
ALT: 23 IU/L (ref 0–32)
AST: 33 IU/L (ref 0–40)
Albumin/Globulin Ratio: 1.8 (ref 1.1–2.5)
Albumin: 4.4 g/dL (ref 3.5–4.8)
Alkaline Phosphatase: 89 IU/L (ref 39–117)
BUN/Creatinine Ratio: 11 (ref 11–26)
BUN: 9 mg/dL (ref 8–27)
CO2: 28 mmol/L (ref 18–29)
Calcium: 9.7 mg/dL (ref 8.7–10.3)
Chloride: 100 mmol/L (ref 97–108)
Creatinine, Ser: 0.81 mg/dL (ref 0.57–1.00)
GFR calc Af Amer: 82 mL/min/{1.73_m2} (ref >59)
GFR calc non Af Amer: 71 mL/min/{1.73_m2} (ref >59)
Globulin, Total: 2.4 g/dL (ref 1.5–4.5)
Glucose: 137 mg/dL — ABNORMAL HIGH (ref 65–99)
Potassium: 3.8 mmol/L (ref 3.5–5.2)
Sodium: 143 mmol/L (ref 134–144)
Total Bilirubin: 0.6 mg/dL (ref 0.0–1.2)
Total Protein: 6.8 g/dL (ref 6.0–8.5)

## 2013-12-29 LAB — LIPID PANEL
Chol/HDL Ratio: 5.4 ratio units — ABNORMAL HIGH (ref 0.0–4.4)
Cholesterol, Total: 188 mg/dL (ref 100–199)
HDL: 35 mg/dL — ABNORMAL LOW (ref >39)
LDL Calculated: 125 mg/dL — ABNORMAL HIGH (ref 0–99)
Triglycerides: 138 mg/dL (ref 0–149)
VLDL Cholesterol Cal: 28 mg/dL (ref 5–40)

## 2013-12-29 LAB — BRAIN NATRIURETIC PEPTIDE: BNP: 56.1 pg/mL (ref 0.0–100.0)

## 2013-12-29 NOTE — Addendum Note (Signed)
Addended by: Pollyann Kennedy F on: 12/29/2013 01:55 PM   Modules accepted: Orders

## 2013-12-30 LAB — CBC WITH DIFFERENTIAL
BASOS ABS: 0 10*3/uL (ref 0.0–0.2)
Basos: 1 %
Eos: 2 %
Eosinophils Absolute: 0.2 10*3/uL (ref 0.0–0.4)
HEMATOCRIT: 44 % (ref 34.0–46.6)
Hemoglobin: 14.9 g/dL (ref 11.1–15.9)
Immature Grans (Abs): 0 10*3/uL (ref 0.0–0.1)
Immature Granulocytes: 0 %
Lymphocytes Absolute: 1.8 10*3/uL (ref 0.7–3.1)
Lymphs: 21 %
MCH: 31.8 pg (ref 26.6–33.0)
MCHC: 33.9 g/dL (ref 31.5–35.7)
MCV: 94 fL (ref 79–97)
MONOCYTES: 7 %
Monocytes Absolute: 0.6 10*3/uL (ref 0.1–0.9)
NEUTROS PCT: 69 %
Neutrophils Absolute: 5.8 10*3/uL (ref 1.4–7.0)
PLATELETS: 270 10*3/uL (ref 150–379)
RBC: 4.69 x10E6/uL (ref 3.77–5.28)
RDW: 14.7 % (ref 12.3–15.4)
WBC: 8.3 10*3/uL (ref 3.4–10.8)

## 2014-01-02 ENCOUNTER — Other Ambulatory Visit: Payer: Self-pay | Admitting: Family Medicine

## 2014-01-02 DIAGNOSIS — E785 Hyperlipidemia, unspecified: Secondary | ICD-10-CM

## 2014-01-02 MED ORDER — ATORVASTATIN CALCIUM 20 MG PO TABS: 20.0000 mg | ORAL_TABLET | Freq: Every day | ORAL | Status: DC

## 2014-01-13 ENCOUNTER — Ambulatory Visit: Payer: Medicare HMO | Admitting: Family Medicine

## 2014-02-01 ENCOUNTER — Encounter: Payer: Self-pay | Admitting: Physician Assistant

## 2014-02-01 ENCOUNTER — Ambulatory Visit (INDEPENDENT_AMBULATORY_CARE_PROVIDER_SITE_OTHER): Payer: Medicare HMO | Admitting: Physician Assistant

## 2014-02-01 VITALS — BP 146/63 | HR 59 | Temp 97.6°F | Ht 64.0 in | Wt 178.2 lb

## 2014-02-01 DIAGNOSIS — F32A Depression, unspecified: Secondary | ICD-10-CM

## 2014-02-01 DIAGNOSIS — F329 Major depressive disorder, single episode, unspecified: Secondary | ICD-10-CM

## 2014-02-01 DIAGNOSIS — F3289 Other specified depressive episodes: Secondary | ICD-10-CM

## 2014-02-01 DIAGNOSIS — J329 Chronic sinusitis, unspecified: Secondary | ICD-10-CM

## 2014-02-01 DIAGNOSIS — F411 Generalized anxiety disorder: Secondary | ICD-10-CM

## 2014-02-01 DIAGNOSIS — J45909 Unspecified asthma, uncomplicated: Secondary | ICD-10-CM

## 2014-02-01 DIAGNOSIS — I10 Essential (primary) hypertension: Secondary | ICD-10-CM

## 2014-02-01 MED ORDER — ALBUTEROL SULFATE HFA 108 (90 BASE) MCG/ACT IN AERS: 2.0000 | INHALATION_SPRAY | Freq: Four times a day (QID) | RESPIRATORY_TRACT | Status: DC | PRN

## 2014-02-01 MED ORDER — AMLODIPINE BESYLATE 10 MG PO TABS: 10.0000 mg | ORAL_TABLET | Freq: Every day | ORAL | Status: DC

## 2014-02-01 MED ORDER — PAROXETINE HCL 20 MG PO TABS: 20.0000 mg | ORAL_TABLET | Freq: Every day | ORAL | Status: DC

## 2014-02-01 MED ORDER — BUDESONIDE-FORMOTEROL FUMARATE 160-4.5 MCG/ACT IN AERO: 2.0000 | INHALATION_SPRAY | Freq: Two times a day (BID) | RESPIRATORY_TRACT | Status: DC

## 2014-02-01 MED ORDER — FLUTICASONE PROPIONATE 50 MCG/ACT NA SUSP: 2.0000 | Freq: Every day | NASAL | Status: DC

## 2014-02-01 NOTE — Progress Notes (Signed)
Subjective:     Patient ID: Tammie [name], female   DOB: 02-07-1935, 79 y.o.   MRN: [id number]  HPI Pt here for f/u of her HTN and Asthma Pt seen last month by Dr Jacelyn Grip and new tx started for both She states her SOB is much better  Review of Systems Denies any CP and has some lower ext edema Denies any N/V or abd pain No SOB or wheezing episodes    Objective:   Physical Exam NAD Vitals reviewed- BP improved No JVD/Bruits Heart- RRR w/o M Lungs- CTA w/o wheeze Trace lower ext edema    Assessment:     HTN Asthma    Plan:     Seems to be tolerating the increased dose of the Norvasc well so will continue Also tolerating the Symbicort She does not have an Alb inhaler so rx done for one today Cont with all other meds Rf x 16months done today Pt is moving to the [place] area so she will be transferring care there this Summer

## 2014-02-01 NOTE — Patient Instructions (Signed)
Asthma, Adult Asthma is a recurring condition in which the airways tighten and narrow. Asthma can make it difficult to breathe. It can cause coughing, wheezing, and shortness of breath. Asthma episodes (also called asthma attacks) range from minor to life-threatening. Asthma cannot be cured, but medicines and lifestyle changes can help control it. CAUSES Asthma is believed to be caused by inherited (genetic) and environmental factors, but its exact cause is unknown. Asthma may be triggered by allergens, lung infections, or irritants in the air. Asthma triggers are different for each person. Common triggers include:   Animal dander.  Dust mites.  Cockroaches.  Pollen from trees or grass.  Mold.  Smoke.  Air pollutants such as dust, household cleaners, hair sprays, aerosol sprays, paint fumes, strong chemicals, or strong odors.  Cold air, weather changes, and winds (which increase molds and pollens in the air).  Strong emotional expressions such as crying or laughing hard.  Stress.  Certain medicines (such as aspirin) or types of drugs (such as beta-blockers).  Sulfites in foods and drinks. Foods and drinks that may contain sulfites include dried fruit, potato chips, and sparkling grape juice.  Infections or inflammatory conditions such as the flu, a cold, or an inflammation of the nasal membranes (rhinitis).  Gastroesophageal reflux disease (GERD).  Exercise or strenuous activity. SYMPTOMS Symptoms may occur immediately after asthma is triggered or many hours later. Symptoms include:  Wheezing.  Excessive nighttime or early morning coughing.  Frequent or severe coughing with a common cold.  Chest tightness.  Shortness of breath. DIAGNOSIS  The diagnosis of asthma is made by a review of your medical history and a physical exam. Tests may also be performed. These may include:  Lung function studies. These tests show how much air you breath in and out.  Allergy  tests.  Imaging tests such as X-rays. TREATMENT  Asthma cannot be cured, but it can usually be controlled. Treatment involves identifying and avoiding your asthma triggers. It also involves medicines. There are 2 classes of medicine used for asthma treatment:   Controller medicines. These prevent asthma symptoms from occurring. They are usually taken every day.  Reliever or rescue medicines. These quickly relieve asthma symptoms. They are used as needed and provide short-term relief. Your health care provider will help you create an asthma action plan. An asthma action plan is a written plan for managing and treating your asthma attacks. It includes a list of your asthma triggers and how they may be avoided. It also includes information on when medicines should be taken and when their dosage should be changed. An action plan may also involve the use of a device called a peak flow meter. A peak flow meter measures how well the lungs are working. It helps you monitor your condition. HOME CARE INSTRUCTIONS   Take medicine as directed by your health care provider. Speak with your health care provider if you have questions about how or when to take the medicines.  Use a peak flow meter as directed by your health care provider. Record and keep track of readings.  Understand and use the action plan to help minimize or stop an asthma attack without needing to seek medical care.  Control your home environment in the following ways to help prevent asthma attacks:  Do not smoke. Avoid being exposed to secondhand smoke.  Change your heating and air conditioning filter regularly.  Limit your use of fireplaces and wood stoves.  Get rid of pests (such as roaches and   mice) and their droppings.  Throw away plants if you see mold on them.  Clean your floors and dust regularly. Use unscented cleaning products.  Try to have someone else vacuum for you regularly. Stay out of rooms while they are being  vacuumed and for a short while afterward. If you vacuum, use a dust mask from a hardware store, a double-layered or microfilter vacuum cleaner bag, or a vacuum cleaner with a HEPA filter.  Replace carpet with wood, tile, or vinyl flooring. Carpet can trap dander and dust.  Use allergy-proof pillows, mattress covers, and box spring covers.  Wash bed sheets and blankets every week in hot water and dry them in a dryer.  Use blankets that are made of polyester or cotton.  Clean bathrooms and kitchens with bleach. If possible, have someone repaint the walls in these rooms with mold-resistant paint. Keep out of the rooms that are being cleaned and painted.  Wash hands frequently. SEEK MEDICAL CARE IF:   You have wheezing, shortness of breath, or a cough even if taking medicine to prevent attacks.  The colored mucus you cough up (sputum) is thicker than usual.  Your sputum changes from clear or white to yellow, green, gray, or bloody.  You have any problems that may be related to the medicines you are taking (such as a rash, itching, swelling, or trouble breathing).  You are using a reliever medicine more than 2 3 times per week.  Your peak flow is still at 50 79% of you personal best after following your action plan for 1 hour. SEEK IMMEDIATE MEDICAL CARE IF:   You seem to be getting worse and are unresponsive to treatment during an asthma attack.  You are short of breath even at rest.  You get short of breath when doing very little physical activity.  You have difficulty eating, drinking, or talking due to asthma symptoms.  You develop chest pain.  You develop a fast heartbeat.  You have a bluish color to your lips or fingernails.  You are lightheaded, dizzy, or faint.  Your peak flow is less than 50% of your personal best.  You have a fever or persistent symptoms for more than 2 3 days.  You have a fever and symptoms suddenly get worse. MAKE SURE YOU:   Understand these  instructions.  Will watch your condition.  Will get help right away if you are not doing well or get worse. Document Released: 09/02/2005 Document Revised: 05/05/2013 Document Reviewed: 04/01/2013 [employer] Patient Information 2014 [employer].  

## 2014-03-08 ENCOUNTER — Ambulatory Visit (INDEPENDENT_AMBULATORY_CARE_PROVIDER_SITE_OTHER): Payer: Medicare HMO | Admitting: Family

## 2014-03-08 ENCOUNTER — Encounter: Payer: Self-pay | Admitting: Family

## 2014-03-08 VITALS — BP 140/78 | HR 74 | Ht 64.0 in | Wt 180.0 lb

## 2014-03-08 DIAGNOSIS — Z7189 Other specified counseling: Secondary | ICD-10-CM

## 2014-03-08 DIAGNOSIS — M79602 Pain in left arm: Secondary | ICD-10-CM

## 2014-03-08 DIAGNOSIS — I10 Essential (primary) hypertension: Secondary | ICD-10-CM

## 2014-03-08 DIAGNOSIS — M79609 Pain in unspecified limb: Secondary | ICD-10-CM

## 2014-03-08 DIAGNOSIS — Z7689 Persons encountering health services in other specified circumstances: Secondary | ICD-10-CM

## 2014-03-08 NOTE — Patient Instructions (Signed)
Cholesterol  Cholesterol is a white, waxy, fat-like protein needed by your body in small amounts. The liver makes all the cholesterol you need. Cholesterol is carried from the liver by the blood through the blood vessels. Deposits of cholesterol (plaque) may build up on blood vessel walls. These make the arteries narrower and stiffer. Cholesterol plaques increase the risk for heart attack and stroke.   You cannot feel your cholesterol level even if it is very high. The only way to know it is high is with a blood test. Once you know your cholesterol levels, you should keep a record of the test results. Work with your health care provider to keep your levels in the desired range.   WHAT DO THE RESULTS MEAN?  · Total cholesterol is a rough measure of all the cholesterol in your blood.    · LDL is the so-called bad cholesterol. This is the type that deposits cholesterol in the walls of the arteries. You want this level to be low.    · HDL is the good cholesterol because it cleans the arteries and carries the LDL away. You want this level to be high.  · Triglycerides are fat that the body can either burn for energy or store. High levels are closely linked to heart disease.    WHAT ARE THE DESIRED LEVELS OF CHOLESTEROL?  · Total cholesterol below 200.    · LDL below 100 for people at risk, below 70 for those at very high risk.    · HDL above 50 is good, above 60 is best.    · Triglycerides below 150.    HOW CAN I LOWER MY CHOLESTEROL?  · Diet. Follow your diet programs as directed by your health care provider.    ¨ Choose fish or white meat chicken and turkey, roasted or baked. Limit fatty cuts of red meat, fried foods, and processed meats, such as sausage and lunch meats.    ¨ Eat lots of fresh fruits and vegetables.  ¨ Choose whole grains, beans, pasta, potatoes, and cereals.    ¨ Use only small amounts of olive, corn, or canola oils.    ¨ Avoid butter, mayonnaise, shortening, or palm kernel oils.  ¨ Avoid foods with  trans fats.    ¨ Drink skim or nonfat milk and eat low-fat or nonfat yogurt and cheeses. Avoid whole milk, cream, ice cream, egg yolks, and full-fat cheeses.    ¨ Healthy desserts include angel food cake, ginger snaps, animal crackers, hard candy, popsicles, and low-fat or nonfat frozen yogurt. Avoid pastries, cakes, pies, and cookies.    · Exercise. Follow your exercise programs as directed by your health care provider.    ¨ A regular program helps decrease LDL and raise HDL.    ¨ A regular program helps with weight control.    ¨ Do things that increase your activity level like gardening, walking, or taking the stairs. Ask your health care provider about how you can be more active in your daily life.    · Medicine. Take medicine as directed by your health care provider.    ¨ Medicine may be prescribed by your health care provider to help lower cholesterol and decrease the risk for heart disease.    ¨ If you have several risk factors, you may need medicine even if your levels are normal.  Document Released: 05/28/2001 Document Revised: 09/07/2013 Document Reviewed: 06/16/2013  [employer]® Patient Information ©2015 [employer]. This information is not intended to replace advice given to you by your health care provider. Make sure you discuss any questions you have with your health   care provider.

## 2014-03-08 NOTE — Progress Notes (Signed)
Pre visit review using our clinic review tool, if applicable. No additional management support is needed unless otherwise documented below in the visit note. 

## 2014-03-08 NOTE — Progress Notes (Signed)
Subjective:    Patient ID: Tammie [name], female    DOB: 1935-09-04, 79 y.o.   MRN: [id number]  HPI  79 year old Caucasian female presents to establish care. She has no acute issues or pain today.  Her PMH is complicated by HTN, GERD, asthma, and history of colon cancer. She is managed by Dr. Fuller Plan for colon cancer; last colonoscopy was 3 years ago. She is planning to schedule follow up soon.  She describes significant family history of CVD, with some cousins and her brother having DVTs. She is not taking a statin at this time.   Review of Systems  Constitutional: Negative.   HENT: Negative.   Eyes: Negative.   Respiratory: Negative.   Cardiovascular: Negative.   Gastrointestinal: Negative.   Endocrine: Negative.   Genitourinary: Negative.   Musculoskeletal: Negative.   Skin: Negative.   Allergic/Immunologic: Negative.   Neurological: Negative.   Hematological: Negative.   Psychiatric/Behavioral: Negative.    Past Medical History  Diagnosis Date  . Anxiety disorder   . Hemorrhoids   . GERD (gastroesophageal reflux disease)   . Hypertension   . Colorectal cancer 1997    T3, N0  . Allergic rhinitis   . Depression   . Asthma     History   Social History  . Marital Status: Divorced    Spouse Name: N/A    Number of Children: N/A  . Years of Education: N/A   Occupational History  . Not on file.   Social History Main Topics  . Smoking status: Never Smoker   . Smokeless tobacco: Never Used  . Alcohol Use: No  . Drug Use: No  . Sexual Activity: Not on file   Other Topics Concern  . Not on file   Social History Narrative   Lives alone.  Two children     Past Surgical History  Procedure Laterality Date  . Colectomy  1997    SIGMOID COLECTOMY 1997  . Dilation and curettage of uterus      D & C  . Tonsillectomy      Family History  Problem Relation Age of Onset  . Sudden death Brother     Apparent DVT/PE  . Sudden death Cousin 40    Paternal  cousin   . Coronary artery disease Cousin 76    Paternal cousin, MI  . Deep vein thrombosis Mother     Allergies  Allergen Reactions  . Erythromycin   . Fentanyl   . Heparin     REACTION: questionable  . Hydrocodone-Acetaminophen   . Meperidine Hcl   . Penicillins   . Procaine Hcl   . Promethazine Hcl   . Temazepam     Current Outpatient Prescriptions on File Prior to Visit  Medication Sig Dispense Refill  . albuterol (PROVENTIL HFA;VENTOLIN HFA) 108 (90 BASE) MCG/ACT inhaler Inhale 2 puffs into the lungs every 6 (six) hours as needed for wheezing or shortness of breath.  1 Inhaler  0  . amLODipine (NORVASC) 10 MG tablet Take 1 tablet (10 mg total) by mouth daily.  30 tablet  6  . BIOTIN PO Take by mouth.      . budesonide-formoterol (SYMBICORT) 160-4.5 MCG/ACT inhaler Inhale 2 puffs into the lungs 2 (two) times daily.  1 Inhaler  3  . fluticasone (FLONASE) 50 MCG/ACT nasal spray Place 2 sprays into both nostrils daily.  16 g  2  . hydrocortisone (PROCTOZONE-HC) 2.5 % rectal cream Place rectally 2 (two) times  daily.  30 g  0  . Multiple Vitamins-Minerals (CENTRUM SILVER PO) Take by mouth. 1 TAB DAILY      . Omega-3 Fatty Acids (FISH OIL) 1200 MG CAPS Take by mouth. DOUBLE STENGTH 1200MG   ---1 CAPSULE ONCE DAILY      . omeprazole (PRILOSEC) 40 MG capsule Take 1 capsule (40 mg total) by mouth daily.  30 capsule  2  . PARoxetine (PAXIL) 20 MG tablet Take 1 tablet (20 mg total) by mouth daily.  90 tablet  3  . vitamin E 100 UNIT capsule Take 100 Units by mouth daily.      [name] triamterene-hydrochlorothiazide (DYAZIDE) 37.5-25 MG per capsule Take 1 each (1 capsule total) by mouth daily.  30 capsule  5   No current facility-administered medications on file prior to visit.    BP 140/78  Pulse 74  Ht 5\' 4"  (1.626 m)  Wt 180 lb (81.647 kg)  BMI 30.88 kg/m2  SpO2 97%     Objective:   Physical Exam  Constitutional: She is oriented to person, place, and time. She appears  well-developed and well-nourished. No distress.  HENT:  Head: Normocephalic and atraumatic.  Eyes: EOM are normal. Pupils are equal, round, and reactive to light.  Neck: Normal range of motion.  Cardiovascular: Normal rate, regular rhythm and normal heart sounds.  Exam reveals no gallop and no friction rub.   No murmur heard. Pulmonary/Chest: Effort normal and breath sounds normal. No respiratory distress.  Abdominal: Soft.  Musculoskeletal: Normal range of motion. She exhibits no edema.  Neurological: She is alert and oriented to person, place, and time.  Skin: Skin is warm and dry. She is not diaphoretic.  Psychiatric: She has a normal mood and affect. Her behavior is normal. Judgment and thought content normal.       Assessment & Plan:  Tammie [name] was seen today for establish care.  Diagnoses and associated orders for this visit:  Left arm pain - Cancel: Ambulatory referral to Cardiology  Encounter to establish care - Cancel: Ambulatory referral to Cardiology  Unspecified essential hypertension    She is to RTC in 3 weeks for CPX. Discussed option for cardiac referral today and patient requested to wait until September for referral.

## 2014-03-09 ENCOUNTER — Telehealth: Payer: Self-pay | Admitting: Family

## 2014-03-09 NOTE — Telephone Encounter (Signed)
Relevant patient education mailed to patient.  

## 2014-03-22 ENCOUNTER — Telehealth: Payer: Self-pay | Admitting: Family

## 2014-03-22 MED ORDER — OMEPRAZOLE 40 MG PO CPDR: 40.0000 mg | DELAYED_RELEASE_CAPSULE | Freq: Every day | ORAL | Status: DC

## 2014-03-22 NOTE — Telephone Encounter (Signed)
[facility] [place] - [address] is requesting re-fill on omeprazole (PRILOSEC) 40 MG capsule

## 2014-03-22 NOTE — Telephone Encounter (Signed)
Done

## 2014-03-31 ENCOUNTER — Encounter: Payer: Self-pay | Admitting: Gastroenterology

## 2014-04-05 ENCOUNTER — Ambulatory Visit: Payer: Medicare HMO | Admitting: Family

## 2014-04-12 ENCOUNTER — Telehealth: Payer: Self-pay | Admitting: Family

## 2014-04-12 DIAGNOSIS — I10 Essential (primary) hypertension: Secondary | ICD-10-CM

## 2014-04-12 NOTE — Telephone Encounter (Signed)
Pt would like to try amlodipine 5 mg #90 with refills instead of 10 mg call into [facility]

## 2014-04-12 NOTE — Telephone Encounter (Signed)
Please advise 

## 2014-04-13 MED ORDER — AMLODIPINE BESYLATE 5 MG PO TABS: 10.0000 mg | ORAL_TABLET | Freq: Every day | ORAL | Status: DC

## 2014-04-13 NOTE — Telephone Encounter (Signed)
done

## 2014-04-27 ENCOUNTER — Telehealth: Payer: Self-pay | Admitting: Family

## 2014-04-27 ENCOUNTER — Encounter: Payer: Self-pay | Admitting: Family

## 2014-04-27 ENCOUNTER — Ambulatory Visit (INDEPENDENT_AMBULATORY_CARE_PROVIDER_SITE_OTHER): Payer: Medicare HMO | Admitting: Family

## 2014-04-27 VITALS — BP 140/80 | HR 64 | Ht 64.0 in | Wt 178.0 lb

## 2014-04-27 DIAGNOSIS — I10 Essential (primary) hypertension: Secondary | ICD-10-CM

## 2014-04-27 NOTE — Telephone Encounter (Signed)
Relevant patient education assigned to patient using Emmi. ° °

## 2014-04-27 NOTE — Progress Notes (Signed)
   Subjective:    Patient ID: Tammie [name], female    DOB: 11/25/1934, 79 y.o.   MRN: [id number]  HPI    Review of Systems     Objective:   Physical Exam   Too early for appt     Assessment & Plan:

## 2014-04-27 NOTE — Progress Notes (Signed)
Pre visit review using our clinic review tool, if applicable. No additional management support is needed unless otherwise documented below in the visit note. 

## 2014-06-09 ENCOUNTER — Encounter: Payer: Self-pay | Admitting: Family

## 2014-06-09 ENCOUNTER — Ambulatory Visit (INDEPENDENT_AMBULATORY_CARE_PROVIDER_SITE_OTHER): Payer: Medicare HMO | Admitting: Family

## 2014-06-09 VITALS — BP 148/82 | HR 55 | Temp 98.1°F | Ht 64.0 in | Wt 180.0 lb

## 2014-06-09 DIAGNOSIS — I10 Essential (primary) hypertension: Secondary | ICD-10-CM

## 2014-06-09 DIAGNOSIS — J209 Acute bronchitis, unspecified: Secondary | ICD-10-CM

## 2014-06-09 DIAGNOSIS — J309 Allergic rhinitis, unspecified: Secondary | ICD-10-CM

## 2014-06-09 MED ORDER — METHYLPREDNISOLONE 4 MG PO KIT: PACK | ORAL | Status: AC

## 2014-06-09 NOTE — Patient Instructions (Signed)
1. Delsym 2. Coricidin HBP  Acute Bronchitis Bronchitis is inflammation of the airways that extend from the windpipe into the lungs (bronchi). The inflammation often causes mucus to develop. This leads to a cough, which is the most common symptom of bronchitis.  In acute bronchitis, the condition usually develops suddenly and goes away over time, usually in a couple weeks. Smoking, allergies, and asthma can make bronchitis worse. Repeated episodes of bronchitis may cause further lung problems.  CAUSES Acute bronchitis is most often caused by the same virus that causes a cold. The virus can spread from person to person (contagious) through coughing, sneezing, and touching contaminated objects. SIGNS AND SYMPTOMS   Cough.   Fever.   Coughing up mucus.   Body aches.   Chest congestion.   Chills.   Shortness of breath.   Sore throat.  DIAGNOSIS  Acute bronchitis is usually diagnosed through a physical exam. Your health care provider will also ask you questions about your medical history. Tests, such as chest X-rays, are sometimes done to rule out other conditions.  TREATMENT  Acute bronchitis usually goes away in a couple weeks. Oftentimes, no medical treatment is necessary. Medicines are sometimes given for relief of fever or cough. Antibiotic medicines are usually not needed but may be prescribed in certain situations. In some cases, an inhaler may be recommended to help reduce shortness of breath and control the cough. A cool mist vaporizer may also be used to help thin bronchial secretions and make it easier to clear the chest.  HOME CARE INSTRUCTIONS  Get plenty of rest.   Drink enough fluids to keep your urine clear or pale yellow (unless you have a medical condition that requires fluid restriction). Increasing fluids may help thin your respiratory secretions (sputum) and reduce chest congestion, and it will prevent dehydration.   Take medicines only as directed by your  health care provider.  If you were prescribed an antibiotic medicine, finish it all even if you start to feel better.  Avoid smoking and secondhand smoke. Exposure to cigarette smoke or irritating chemicals will make bronchitis worse. If you are a smoker, consider using nicotine gum or skin patches to help control withdrawal symptoms. Quitting smoking will help your lungs heal faster.   Reduce the chances of another bout of acute bronchitis by washing your hands frequently, avoiding people with cold symptoms, and trying not to touch your hands to your mouth, nose, or eyes.   Keep all follow-up visits as directed by your health care provider.  SEEK MEDICAL CARE IF: Your symptoms do not improve after 1 week of treatment.  SEEK IMMEDIATE MEDICAL CARE IF:  You develop an increased fever or chills.   You have chest pain.   You have severe shortness of breath.  You have bloody sputum.   You develop dehydration.  You faint or repeatedly feel like you are going to pass out.  You develop repeated vomiting.  You develop a severe headache. MAKE SURE YOU:   Understand these instructions.  Will watch your condition.  Will get help right away if you are not doing well or get worse. Document Released: 10/10/2004 Document Revised: 01/17/2014 Document Reviewed: 02/23/2013 [facility] Patient Information 2015 [place], [place]. This information is not intended to replace advice given to you by your health care provider. Make sure you discuss any questions you have with your health care provider.

## 2014-06-09 NOTE — Progress Notes (Signed)
Pre visit review using our clinic review tool, if applicable. No additional management support is needed unless otherwise documented below in the visit note. 

## 2014-06-12 NOTE — Progress Notes (Signed)
Subjective:    Patient ID: Tammie [name], female    DOB: 1934-11-14, 79 y.o.   MRN: [id number]  HPI 79 year old, white female, nonsmoker, with a history of Hypertension is in today with c/o cough, sneezing, nasal congestion, nausea x 1 week and worsening. Has been taking Mucinex that has not helping much. Reports she has had wheezing. Denies fever to chills.    Review of Systems  Constitutional: Negative.   HENT: Positive for congestion, postnasal drip, sneezing and tinnitus.   Respiratory: Positive for cough and wheezing.   Cardiovascular: Negative.   Endocrine: Negative.   Musculoskeletal: Negative.   Skin: Negative.   Allergic/Immunologic: Negative.   Neurological: Negative.   Hematological: Negative.   Psychiatric/Behavioral: Negative.    Past Medical History  Diagnosis Date  . Anxiety disorder   . Hemorrhoids   . GERD (gastroesophageal reflux disease)   . Hypertension   . Colorectal cancer 1997    T3, N0  . Allergic rhinitis   . Depression   . Asthma     History   Social History  . Marital Status: Divorced    Spouse Name: N/A    Number of Children: N/A  . Years of Education: N/A   Occupational History  . Not on file.   Social History Main Topics  . Smoking status: Never Smoker   . Smokeless tobacco: Never Used  . Alcohol Use: No  . Drug Use: No  . Sexual Activity: Not on file   Other Topics Concern  . Not on file   Social History Narrative   Lives alone.  Two children     Past Surgical History  Procedure Laterality Date  . Colectomy  1997    SIGMOID COLECTOMY 1997  . Dilation and curettage of uterus      D & C  . Tonsillectomy      Family History  Problem Relation Age of Onset  . Sudden death Brother     Apparent DVT/PE  . Sudden death Cousin 78    Paternal cousin   . Coronary artery disease Cousin 45    Paternal cousin, MI  . Deep vein thrombosis Mother     Allergies  Allergen Reactions  . Erythromycin   . Fentanyl   .  Heparin     REACTION: questionable  . Hydrocodone-Acetaminophen   . Meperidine Hcl   . Penicillins   . Procaine Hcl   . Promethazine Hcl   . Temazepam     Current Outpatient Prescriptions on File Prior to Visit  Medication Sig Dispense Refill  . albuterol (PROVENTIL HFA;VENTOLIN HFA) 108 (90 BASE) MCG/ACT inhaler Inhale 2 puffs into the lungs every 6 (six) hours as needed for wheezing or shortness of breath.  1 Inhaler  0  . amLODipine (NORVASC) 5 MG tablet Take 2 tablets (10 mg total) by mouth daily.  90 tablet  1  . budesonide-formoterol (SYMBICORT) 160-4.5 MCG/ACT inhaler Inhale 2 puffs into the lungs 2 (two) times daily.  1 Inhaler  3  . fluticasone (FLONASE) 50 MCG/ACT nasal spray Place 2 sprays into both nostrils daily.  16 g  2  . hydrocortisone (PROCTOZONE-HC) 2.5 % rectal cream Place rectally 2 (two) times daily.  30 g  0  . Multiple Vitamins-Minerals (CENTRUM SILVER PO) Take by mouth. 1 TAB DAILY      . Omega-3 Fatty Acids (FISH OIL) 1200 MG CAPS Take by mouth. DOUBLE STENGTH 1200MG   ---1 CAPSULE ONCE DAILY      .  omeprazole (PRILOSEC) 40 MG capsule Take 1 capsule (40 mg total) by mouth daily.  90 capsule  2  . PARoxetine (PAXIL) 20 MG tablet Take 1 tablet (20 mg total) by mouth daily.  90 tablet  3  . triamterene-hydrochlorothiazide (DYAZIDE) 37.5-25 MG per capsule Take 1 each (1 capsule total) by mouth daily.  30 capsule  5  . vitamin E 100 UNIT capsule Take 100 Units by mouth daily.       No current facility-administered medications on file prior to visit.    BP 148/82  Pulse 55  Temp(Src) 98.1 F (36.7 C) (Oral)  Ht 5\' 4"  (1.626 m)  Wt 180 lb (81.647 kg)  BMI 30.88 kg/m2  SpO2 98%chart     Objective:   Physical Exam  Constitutional: She is oriented to person, place, and time. She appears well-developed and well-nourished.  HENT:  Right Ear: External ear normal.  Left Ear: External ear normal.  Nose: Nose normal.  Mouth/Throat: Oropharynx is clear and moist.    Neck: Normal range of motion. Neck supple.  Cardiovascular: Normal rate, regular rhythm and normal heart sounds.   Pulmonary/Chest: Effort normal. She has wheezes.  Mild expiratory wheezing.   Neurological: She is alert and oriented to person, place, and time.  Skin: Skin is warm and dry.  Psychiatric: She has a normal mood and affect.          Assessment & Plan:  Tammie [name] was seen today for sinusitis and cough.  Diagnoses and associated orders for this visit:  Acute bronchitis, unspecified organism  Allergic rhinitis, unspecified allergic rhinitis type  Unspecified essential hypertension  Other Orders - methylPREDNISolone (MEDROL DOSEPAK) 4 MG tablet; follow package directions   Call the office with any questions or concerns. Recheck as scheduled and as needed.

## 2014-06-14 ENCOUNTER — Encounter: Payer: Self-pay | Admitting: Gastroenterology

## 2014-06-23 ENCOUNTER — Telehealth: Payer: Self-pay | Admitting: Family

## 2014-06-23 NOTE — Telephone Encounter (Signed)
[facility] - [place] - [address] is requesting re-fill on fluticasone (FLONASE) 50 MCG/ACT nasal spray

## 2014-06-24 MED ORDER — FLUTICASONE PROPIONATE 50 MCG/ACT NA SUSP: 2.0000 | Freq: Every day | NASAL | Status: DC

## 2014-06-24 NOTE — Telephone Encounter (Signed)
Done

## 2014-07-13 ENCOUNTER — Telehealth: Payer: Self-pay | Admitting: Family

## 2014-07-13 MED ORDER — HYDROCORTISONE 2.5 % RE CREA: TOPICAL_CREAM | Freq: Two times a day (BID) | RECTAL | Status: DC

## 2014-07-13 NOTE — Telephone Encounter (Signed)
[place], [place] - [address] is requesting re-fill on hydrocortisone (PROCTOZONE-HC) 2.5 % rectal cream

## 2014-07-13 NOTE — Telephone Encounter (Signed)
Done

## 2014-07-14 ENCOUNTER — Telehealth: Payer: Self-pay | Admitting: Family

## 2014-07-14 DIAGNOSIS — I10 Essential (primary) hypertension: Secondary | ICD-10-CM

## 2014-07-14 NOTE — Telephone Encounter (Signed)
[facility] - [place] - [address] is requesting re-fill on amLODipine (NORVASC) 5 MG tablet ° °

## 2014-07-15 MED ORDER — AMLODIPINE BESYLATE 5 MG PO TABS: 10.0000 mg | ORAL_TABLET | Freq: Every day | ORAL | Status: DC

## 2014-07-15 NOTE — Telephone Encounter (Signed)
Done

## 2014-08-04 ENCOUNTER — Ambulatory Visit (AMBULATORY_SURGERY_CENTER): Payer: Self-pay | Admitting: *Deleted

## 2014-08-04 VITALS — Ht 64.5 in | Wt 178.4 lb

## 2014-08-04 DIAGNOSIS — Z85038 Personal history of other malignant neoplasm of large intestine: Secondary | ICD-10-CM

## 2014-08-04 MED ORDER — MOVIPREP 100 G PO SOLR: 1.0000 | Freq: Once | ORAL | Status: DC

## 2014-08-04 NOTE — Progress Notes (Signed)
No blood thinners. ewm No egg or soy allergy. ewm No issues with past sedation. ewm No diet pills.ewm

## 2014-08-09 ENCOUNTER — Encounter: Payer: Self-pay | Admitting: Gastroenterology

## 2014-08-09 ENCOUNTER — Encounter: Payer: Self-pay | Admitting: Family

## 2014-08-09 ENCOUNTER — Ambulatory Visit (INDEPENDENT_AMBULATORY_CARE_PROVIDER_SITE_OTHER): Payer: Medicare HMO | Admitting: Family

## 2014-08-09 VITALS — BP 132/80 | HR 54 | Ht 64.0 in | Wt 175.0 lb

## 2014-08-09 DIAGNOSIS — K2100 Gastro-esophageal reflux disease with esophagitis, without bleeding: Secondary | ICD-10-CM

## 2014-08-09 DIAGNOSIS — F32A Depression, unspecified: Secondary | ICD-10-CM

## 2014-08-09 DIAGNOSIS — I10 Essential (primary) hypertension: Secondary | ICD-10-CM

## 2014-08-09 DIAGNOSIS — J309 Allergic rhinitis, unspecified: Secondary | ICD-10-CM

## 2014-08-09 DIAGNOSIS — Z0000 Encounter for general adult medical examination without abnormal findings: Secondary | ICD-10-CM

## 2014-08-09 DIAGNOSIS — F329 Major depressive disorder, single episode, unspecified: Secondary | ICD-10-CM

## 2014-08-09 DIAGNOSIS — Z23 Encounter for immunization: Secondary | ICD-10-CM

## 2014-08-09 NOTE — Progress Notes (Signed)
Subjective:    Patient ID: Tammie [name], female    DOB: 1935-01-28, 79 y.o.   MRN: [id number]  HPI 79 year old white female,  Is in for a routine wellness  examination for this patient . I reviewed all health maintenance protocols including mammography, colonoscopy, bone density Needed referrals were placed. Age and diagnosis  appropriate screening labs were ordered. Her immunization history was reviewed and appropriate vaccinations were ordered. Her current medications and allergies were reviewed and needed refills of her chronic medications were ordered. The plan for yearly health maintenance was discussed all orders and referrals were made as appropriate.  Patient reports exercising 3 times a week and is down 4 pounds. Had a consultation and is having a colonoscopy in December. Reports she stopped Symbicort due to it causing headaches. Has not used her Albuterol in months.   Patient wishes to schedule her own mammogram   Review of Systems  Constitutional: Negative.   HENT: Negative.   Eyes: Negative.   Respiratory: Negative.   Cardiovascular: Negative.   Gastrointestinal: Negative.   Endocrine: Negative.   Genitourinary: Negative.   Musculoskeletal: Negative.   Skin: Negative.   Allergic/Immunologic: Negative.   Neurological: Negative.   Hematological: Negative.   Psychiatric/Behavioral: Negative.    Past Medical History  Diagnosis Date  . Anxiety disorder   . Hemorrhoids   . GERD (gastroesophageal reflux disease)   . Hypertension   . Colorectal cancer 1997    T3, N0  . Allergic rhinitis   . Asthma   . Allergy     seasonal  . Depression     pt states she isn't depressed anymore, continues paxil daily  . Cataract     left eye    History   Social History  . Marital Status: Divorced    Spouse Name: N/A    Number of Children: N/A  . Years of Education: N/A   Occupational History  . Not on file.   Social History Main Topics  . Smoking status: Never Smoker    . Smokeless tobacco: Never Used  . Alcohol Use: No  . Drug Use: No  . Sexual Activity: Not on file   Other Topics Concern  . Not on file   Social History Narrative   Lives alone.  Two children     Past Surgical History  Procedure Laterality Date  . Colectomy  1997    SIGMOID COLECTOMY 1997  . Dilation and curettage of uterus      D & C  . Tonsillectomy    . Colonoscopy    . Polypectomy      Family History  Problem Relation Age of Onset  . Sudden death Brother     Apparent DVT/PE  . Sudden death Cousin 20    Paternal cousin   . Coronary artery disease Cousin 63    Paternal cousin, MI  . Deep vein thrombosis Mother   . Colon cancer Neg Hx   . Rectal cancer Neg Hx   . Stomach cancer Neg Hx   . Breast cancer Daughter     Allergies  Allergen Reactions  . Erythromycin   . Fentanyl   . Heparin     REACTION: questionable  . Hydrocodone-Acetaminophen   . Meperidine Hcl   . Penicillins   . Procaine Hcl   . Promethazine Hcl   . Temazepam     Current Outpatient Prescriptions on File Prior to Visit  Medication Sig Dispense Refill  . albuterol (  PROVENTIL HFA;VENTOLIN HFA) 108 (90 BASE) MCG/ACT inhaler Inhale 2 puffs into the lungs every 6 (six) hours as needed for wheezing or shortness of breath. 1 Inhaler 0  . amLODipine (NORVASC) 5 MG tablet Take 2 tablets (10 mg total) by mouth daily. 180 tablet 1  . fluticasone (FLONASE) 50 MCG/ACT nasal spray Place 2 sprays into both nostrils daily. 16 g 2  . hydrocortisone (PROCTOZONE-HC) 2.5 % rectal cream Place rectally 2 (two) times daily. 30 g 0  . MOVIPREP 100 G SOLR Take 1 kit (200 g total) by mouth once. moviprep as directed. No substitutions 1 kit 0  . Multiple Vitamins-Minerals (CENTRUM SILVER PO) Take by mouth. 1 TAB DAILY    . Omega-3 Fatty Acids (FISH OIL) 1200 MG CAPS Take by mouth. DOUBLE STENGTH 1200MG  ---1 CAPSULE ONCE DAILY    . omeprazole (PRILOSEC) 40 MG capsule Take 1 capsule (40 mg total) by mouth daily.  90 capsule 2  . PARoxetine (PAXIL) 20 MG tablet Take 1 tablet (20 mg total) by mouth daily. 90 tablet 3  . vitamin E 100 UNIT capsule Take 100 Units by mouth daily.     No current facility-administered medications on file prior to visit.    BP 132/80 mmHg  Pulse 54  Ht 5' 4"  (1.626 m)  Wt 175 lb (79.379 kg)  BMI 30.02 kg/m2chart    Objective:   Physical Exam  Constitutional: She is oriented to person, place, and time. She appears well-developed and well-nourished.  HENT:  Right Ear: External ear normal.  Left Ear: External ear normal.  Nose: Nose normal.  Mouth/Throat: Oropharynx is clear and moist.  Eyes: Conjunctivae are normal. Pupils are equal, round, and reactive to light.  Neck: Normal range of motion. Neck supple.  Cardiovascular: Normal rate, regular rhythm and normal heart sounds.   Pulmonary/Chest: Effort normal and breath sounds normal.  Abdominal: Soft. Bowel sounds are normal.  Musculoskeletal: Normal range of motion.  Neurological: She is alert and oriented to person, place, and time. She has normal reflexes.  Skin: Skin is warm and dry.  Psychiatric: She has a normal mood and affect.          Assessment & Plan:  Shylynn was seen today for annual exam.  Diagnoses and associated orders for this visit:  Medicare annual wellness visit, subsequent - Basic Metabolic Panel - Hepatic Function Panel - Lipid Panel - CBC with Differential - POC Urinalysis Dipstick - TSH  Need for prophylactic vaccination against Streptococcus pneumoniae (pneumococcus) - Pneumococcal conjugate vaccine [id number] - Basic Metabolic Panel - Hepatic Function Panel - Lipid Panel - CBC with Differential - POC Urinalysis Dipstick - TSH  Gastroesophageal reflux disease with esophagitis - Basic Metabolic Panel - Hepatic Function Panel - Lipid Panel - CBC with Differential - POC Urinalysis Dipstick - TSH  Depression - Basic Metabolic Panel - Hepatic Function Panel - Lipid  Panel - CBC with Differential - POC Urinalysis Dipstick - TSH  Essential hypertension - Basic Metabolic Panel - Hepatic Function Panel - Lipid Panel - CBC with Differential - POC Urinalysis Dipstick - TSH  Allergic rhinitis, unspecified allergic rhinitis type - Basic Metabolic Panel - Hepatic Function Panel - Lipid Panel - CBC with Differential - POC Urinalysis Dipstick - TSH    call the office with any questions or concerns. Continue exercising. Advise monthly self breast exams. Recheck in 6 months and sooner as needed.

## 2014-08-09 NOTE — Patient Instructions (Signed)

## 2014-08-09 NOTE — Progress Notes (Signed)
Pre visit review using our clinic review tool, if applicable. No additional management support is needed unless otherwise documented below in the visit note. 

## 2014-08-25 ENCOUNTER — Encounter: Payer: Medicare HMO | Admitting: Gastroenterology

## 2014-09-27 ENCOUNTER — Telehealth: Payer: Self-pay | Admitting: Family

## 2014-09-27 NOTE — Telephone Encounter (Signed)
Pt is asking if she can tranfser from [facility] to Dr Yong Channel . Will Dr Yong Channel except her as a patient

## 2014-09-28 ENCOUNTER — Other Ambulatory Visit: Payer: Self-pay

## 2014-09-28 MED ORDER — OMEPRAZOLE 40 MG PO CPDR: 40.0000 mg | DELAYED_RELEASE_CAPSULE | Freq: Every day | ORAL | Status: DC

## 2014-09-28 NOTE — Telephone Encounter (Signed)
Yes, but I am not accepting any transfers before June.  Starting in June I will only see 1 new or establish patient 1 or older per week.  Forwarding to [place] as well to make sure new template established and front office aware.  Do not schedule until [place] reviews

## 2014-09-28 NOTE — Telephone Encounter (Signed)
See below

## 2014-09-29 ENCOUNTER — Encounter: Payer: Self-pay | Admitting: Gastroenterology

## 2014-09-29 ENCOUNTER — Ambulatory Visit (AMBULATORY_SURGERY_CENTER): Payer: PPO | Admitting: Gastroenterology

## 2014-09-29 VITALS — BP 159/80 | HR 54 | Temp 97.5°F | Resp 14 | Ht 66.0 in | Wt 178.0 lb

## 2014-09-29 DIAGNOSIS — Z85038 Personal history of other malignant neoplasm of large intestine: Secondary | ICD-10-CM

## 2014-09-29 DIAGNOSIS — Z86010 Personal history of colonic polyps: Secondary | ICD-10-CM

## 2014-09-29 MED ORDER — SODIUM CHLORIDE 0.9 % IV SOLN: 500.0000 mL | INTRAVENOUS | Status: DC

## 2014-09-29 NOTE — Op Note (Signed)
[facility]  [address] [place], [id number]   COLONOSCOPY PROCEDURE REPORT  PATIENT: Tammie [name], Tammie [name]  MR#: [id number] BIRTHDATE: [birth year] , 79  yrs. old GENDER: female ENDOSCOPIST: Ladene Artist, MD, [facility] PROCEDURE DATE:  09/29/2014 PROCEDURE:   Colonoscopy, surveillance First Screening Colonoscopy - Avg.  risk and is 50 yrs.  old or older - No.  Prior Negative Screening - Now for repeat screening. N/A  History of Adenoma - Now for follow-up colonoscopy & has been > or = to 3 yrs.  Yes hx of adenoma.  Has been 3 or more years since last colonoscopy.  Polyps Removed Today? No.  Polyps Removed Today? No.  Recommend repeat exam, <10 yrs? Polyps Removed Today? No.  Recommend repeat exam, <10 yrs? No. ASA CLASS:   Class II INDICATIONS:high risk personal history of colon cancer and surveillance colonoscopy based on a history of adenomatous colonic polyp(s). MEDICATIONS: Monitored anesthesia care and Propofol 200 mg IV DESCRIPTION OF PROCEDURE:   After the risks benefits and alternatives of the procedure were thoroughly explained, informed consent was obtained.  The digital rectal exam revealed no abnormalities of the rectum.   The [id number]  endoscope was introduced through the anus and advanced to the cecum, which was identified by both the appendix and ileocecal valve. No adverse events experienced.   The quality of the prep was adequate, using MoviPrep  The instrument was then slowly withdrawn as the colon was fully examined.    COLON FINDINGS: A normal appearing cecum, ileocecal valve, and appendiceal orifice were identified.  The ascending, transverse, descending, sigmoid colon, and rectum appeared unremarkable. Retroflexed views revealed internal Grade I hemorrhoids. The time to cecum=1 minutes 59 seconds.  Withdrawal time=12 minutes 06 seconds.  The scope was withdrawn and the procedure completed. COMPLICATIONS: There were no immediate  complications.  ENDOSCOPIC IMPRESSION: 1.  Normal colonoscopy 2.  Grade I internal hemorrhoids  RECOMMENDATIONS: 1.  Given your age, you will not need another colonoscopy for colon cancer screening or polyp surveillance.  These types of tests usually stop around the age 69.  eSigned:  Ladene Artist, MD, [facility] 09/29/2014 12:14 PM

## 2014-09-29 NOTE — Progress Notes (Signed)
Report to PACU, RN, vss, BBS= Clear.  

## 2014-09-29 NOTE — Progress Notes (Signed)
Pt's daughter has her inhaler. maw

## 2014-09-29 NOTE — Progress Notes (Signed)
No problems noted in the recovery room. maw 

## 2014-09-29 NOTE — Patient Instructions (Signed)

## 2014-09-30 ENCOUNTER — Telehealth: Payer: Self-pay | Admitting: *Deleted

## 2014-09-30 NOTE — Telephone Encounter (Signed)
Number identifier, left message, follow-up  

## 2014-10-04 NOTE — Telephone Encounter (Signed)
lmovm to return my call

## 2014-10-06 ENCOUNTER — Encounter: Payer: Medicare HMO | Admitting: Gastroenterology

## 2014-10-19 NOTE — Telephone Encounter (Signed)
lmovm to call back.

## 2014-10-19 NOTE — Telephone Encounter (Signed)
Pt is aware of 07-2015 appt

## 2014-11-02 ENCOUNTER — Other Ambulatory Visit: Payer: Self-pay

## 2014-11-02 MED ORDER — BUDESONIDE-FORMOTEROL FUMARATE 160-4.5 MCG/ACT IN AERO: 2.0000 | INHALATION_SPRAY | Freq: Two times a day (BID) | RESPIRATORY_TRACT | Status: DC

## 2014-11-28 ENCOUNTER — Telehealth: Payer: Self-pay

## 2014-11-28 MED ORDER — BUDESONIDE-FORMOTEROL FUMARATE 160-4.5 MCG/ACT IN AERO: 2.0000 | INHALATION_SPRAY | Freq: Two times a day (BID) | RESPIRATORY_TRACT | Status: DC

## 2014-11-28 NOTE — Telephone Encounter (Signed)
[place] refill request for [facility].  Done

## 2014-12-29 ENCOUNTER — Other Ambulatory Visit: Payer: Self-pay

## 2014-12-29 MED ORDER — OMEPRAZOLE 40 MG PO CPDR: 40.0000 mg | DELAYED_RELEASE_CAPSULE | Freq: Every day | ORAL | Status: DC

## 2015-01-09 ENCOUNTER — Encounter: Payer: Self-pay | Admitting: Gastroenterology

## 2015-02-07 ENCOUNTER — Ambulatory Visit: Payer: Medicare HMO | Admitting: Family

## 2015-02-09 ENCOUNTER — Encounter: Payer: Self-pay | Admitting: Adult Health

## 2015-02-09 ENCOUNTER — Ambulatory Visit (INDEPENDENT_AMBULATORY_CARE_PROVIDER_SITE_OTHER): Payer: PPO | Admitting: Adult Health

## 2015-02-09 VITALS — BP 124/82 | Ht 66.0 in | Wt 178.9 lb

## 2015-02-09 DIAGNOSIS — R200 Anesthesia of skin: Secondary | ICD-10-CM | POA: Diagnosis not present

## 2015-02-09 DIAGNOSIS — I10 Essential (primary) hypertension: Secondary | ICD-10-CM

## 2015-02-09 DIAGNOSIS — K922 Gastrointestinal hemorrhage, unspecified: Secondary | ICD-10-CM | POA: Diagnosis not present

## 2015-02-09 DIAGNOSIS — J4521 Mild intermittent asthma with (acute) exacerbation: Secondary | ICD-10-CM

## 2015-02-09 DIAGNOSIS — R202 Paresthesia of skin: Secondary | ICD-10-CM

## 2015-02-09 LAB — CBC WITH DIFFERENTIAL/PLATELET
BASOS PCT: 0.3 % (ref 0.0–3.0)
Basophils Absolute: 0 10*3/uL (ref 0.0–0.1)
EOS PCT: 1.6 % (ref 0.0–5.0)
Eosinophils Absolute: 0.1 10*3/uL (ref 0.0–0.7)
HEMATOCRIT: 45.1 % (ref 36.0–46.0)
Hemoglobin: 15.9 g/dL — ABNORMAL HIGH (ref 12.0–15.0)
LYMPHS ABS: 1.5 10*3/uL (ref 0.7–4.0)
Lymphocytes Relative: 17.2 % (ref 12.0–46.0)
MCHC: 35.3 g/dL (ref 30.0–36.0)
MCV: 92.1 fl (ref 78.0–100.0)
Monocytes Absolute: 0.7 10*3/uL (ref 0.1–1.0)
Monocytes Relative: 8.2 % (ref 3.0–12.0)
NEUTROS PCT: 72.7 % (ref 43.0–77.0)
Neutro Abs: 6.4 10*3/uL (ref 1.4–7.7)
PLATELETS: 271 10*3/uL (ref 150.0–400.0)
RBC: 4.9 Mil/uL (ref 3.87–5.11)
RDW: 13.4 % (ref 11.5–15.5)
WBC: 8.7 10*3/uL (ref 4.0–10.5)

## 2015-02-09 LAB — HEMOGLOBIN A1C: Hgb A1c MFr Bld: 5.8 % (ref 4.6–6.5)

## 2015-02-09 LAB — BASIC METABOLIC PANEL
BUN: 9 mg/dL (ref 6–23)
CALCIUM: 10.1 mg/dL (ref 8.4–10.5)
CHLORIDE: 101 meq/L (ref 96–112)
CO2: 33 mEq/L — ABNORMAL HIGH (ref 19–32)
Creatinine, Ser: 0.77 mg/dL (ref 0.40–1.20)
GFR: 77.17 mL/min (ref >60.00)
GLUCOSE: 150 mg/dL — AB (ref 70–99)
Potassium: 4.1 mEq/L (ref 3.5–5.1)
Sodium: 139 mEq/L (ref 135–145)

## 2015-02-09 NOTE — Progress Notes (Signed)
Pre visit review using our clinic review tool, if applicable. No additional management support is needed unless otherwise documented below in the visit note. 

## 2015-02-09 NOTE — Patient Instructions (Signed)
We will get blood work to check for diabetes and any blood loss. I will follow up with you regarding the results.   A referral for ENT has been placed and they will call you to schedule an appointment. If you have any more bright red blood in your mouth, please go to the ER. Do not put your tooth brush down your throat anymore!   Start monitoring your blood pressures at home and bring the log to your appointment with the PCP.

## 2015-02-09 NOTE — Progress Notes (Addendum)
Subjective:    Patient ID: Tammie [name], female    DOB: May 28, 1935, 80 y.o.   MRN: [id number]  HPI  Patient presents to the office today for multiple issues and follow up regarding blood pressure  1. She would like blood work to find if she is diabetic. She endorses that she wants this blood work because she has occassional numbness and tingling in her feet when she lays in bed. Sitting up helps the discomfort. She also endorses endorses numbness in her feet when she is on her feet a lot. Also complains of shooting pain down the left side of her leg, this happens on occasion. The shooting pain and numbness and tingling has been going on for " many years."   - She does have a significant family history of blood clots.   2. Asthma - Since moving [place] of her farm house and into her apartment her asthma has improved. She has not used her inhaler in three days.   3. Bright Red Blood in month - On Sunday she felt as though her throat was sore and she felt like she had something in her throat when she swallowed. She stuck her toothbrush down her throat and then endorses " bright red blood came out of my mouth for 3-4 minutes". She does not endorse any other episodes of bright red blood per mouth. She does have a history of dyspagia. Does not have any black tarry stools or BRBPR  4. Blood pressure - Is good in the office today. Has not been monitoring at home. She feels as though it is well controlled at this time. Denies any headaches or blurred vision. Takes blood pressure medication every day.   Past Medical History  Diagnosis Date  . Anxiety disorder   . Hemorrhoids   . GERD (gastroesophageal reflux disease)   . Hypertension   . Colorectal cancer 1997    T3, N0  . Allergic rhinitis   . Asthma   . Allergy     seasonal  . Depression     pt states she isn't depressed anymore, continues paxil daily  . Cataract     left eye    History   Social History  . Marital Status: Divorced     Spouse Name: N/A  . Number of Children: N/A  . Years of Education: N/A   Occupational History  . Not on file.   Social History Main Topics  . Smoking status: Never Smoker   . Smokeless tobacco: Never Used  . Alcohol Use: No  . Drug Use: No  . Sexual Activity: Not on file   Other Topics Concern  . Not on file   Social History Narrative   Lives alone.  Two children     Past Surgical History  Procedure Laterality Date  . Colectomy  1997    SIGMOID COLECTOMY 1997  . Dilation and curettage of uterus      D & C  . Tonsillectomy    . Colonoscopy    . Polypectomy      Family History  Problem Relation Age of Onset  . Sudden death Brother     Apparent DVT/PE  . Sudden death Cousin 5    Paternal cousin   . Coronary artery disease Cousin 63    Paternal cousin, MI  . Deep vein thrombosis Mother   . Colon cancer Neg Hx   . Rectal cancer Neg Hx   . Stomach cancer Neg Hx   .  Breast cancer Daughter     Allergies  Allergen Reactions  . Erythromycin   . Fentanyl   . Heparin     REACTION: questionable  . Hydrocodone-Acetaminophen   . Meperidine Hcl   . Penicillins   . Procaine Hcl   . Promethazine Hcl   . Temazepam     Current Outpatient Prescriptions on File Prior to Visit  Medication Sig Dispense Refill  . albuterol (PROVENTIL HFA;VENTOLIN HFA) 108 (90 BASE) MCG/ACT inhaler Inhale 2 puffs into the lungs every 6 (six) hours as needed for wheezing or shortness of breath. 1 Inhaler 0  . amLODipine (NORVASC) 5 MG tablet Take 2 tablets (10 mg total) by mouth daily. (Patient taking differently: Take 5 mg by mouth daily. ) 180 tablet 1  . budesonide-formoterol (SYMBICORT) 160-4.5 MCG/ACT inhaler Inhale 2 puffs into the lungs 2 (two) times daily. 1 Inhaler 3  . fluticasone (FLONASE) 50 MCG/ACT nasal spray Place 2 sprays into both nostrils daily. 16 g 2  . hydrocortisone (PROCTOZONE-HC) 2.5 % rectal cream Place rectally 2 (two) times daily. 30 g 0  . Omega-3 Fatty Acids  (FISH OIL) 1200 MG CAPS Take by mouth. DOUBLE STENGTH 1200MG   ---1 CAPSULE ONCE DAILY    . omeprazole (PRILOSEC) 40 MG capsule Take 1 capsule (40 mg total) by mouth daily. 90 capsule 1  . PARoxetine (PAXIL) 20 MG tablet Take 1 tablet (20 mg total) by mouth daily. 90 tablet 3  . vitamin E 100 UNIT capsule Take 100 Units by mouth daily.     No current facility-administered medications on file prior to visit.    BP 124/82 mmHg  Ht 5\' 6"  (1.676 m)  Wt 178 lb 14.4 oz (81.149 kg)  BMI 28.89 kg/m2     Review of Systems  Constitutional: Negative for fever, chills, activity change and fatigue.  Respiratory: Negative for cough, shortness of breath and wheezing.   Cardiovascular: Negative for chest pain and leg swelling.  Musculoskeletal: Positive for myalgias, back pain and arthralgias. Negative for joint swelling and gait problem.  Neurological: Negative for dizziness, speech difficulty, weakness and headaches.  All other systems reviewed and are negative.      Objective:   Physical Exam  Constitutional: She is oriented to person, place, and time. She appears well-developed and well-nourished. No distress.  Cardiovascular: Normal rate, regular rhythm, normal heart sounds and intact distal pulses.  Exam reveals no gallop and no friction rub.   No murmur heard. Pulmonary/Chest: Effort normal and breath sounds normal. No respiratory distress. She has no wheezes. She has no rales. She exhibits no tenderness.  Musculoskeletal: Normal range of motion. She exhibits no edema or tenderness.  Neurological: She is alert and oriented to person, place, and time.  Skin: Skin is warm and dry. No rash noted. She is not diaphoretic. No erythema. No pallor.  Vitals reviewed.      Assessment & Plan:  1. Acute upper GI bleeding - CBC with Differential/Platelet - Ambulatory referral to ENT - Follow up in the ER with any additional blood in mouth or rectum  2. Numbness and tingling of both legs below  knees - Hemoglobin F5D - Basic metabolic panel - Will follow up with labs  3. Essential hypertension - Seems to be controlled on current regimen.  - Advised to start recording blood pressure and bring to PCP - Follow up sooner with any highs or lows  4. Asthma with acute exacerbation, mild intermittent - Controlled.  '

## 2015-02-21 ENCOUNTER — Other Ambulatory Visit: Payer: Self-pay

## 2015-02-21 DIAGNOSIS — F411 Generalized anxiety disorder: Secondary | ICD-10-CM

## 2015-02-22 ENCOUNTER — Telehealth: Payer: Self-pay | Admitting: Family

## 2015-02-22 DIAGNOSIS — F411 Generalized anxiety disorder: Secondary | ICD-10-CM

## 2015-02-22 MED ORDER — PAROXETINE HCL 20 MG PO TABS: 20.0000 mg | ORAL_TABLET | Freq: Every day | ORAL | Status: DC

## 2015-02-22 NOTE — Telephone Encounter (Addendum)
Pt needs refill on paxil 20 mg call into [facility]. Pt has an appt to est w/hunter in nov 2016

## 2015-02-22 NOTE — Telephone Encounter (Signed)
Done

## 2015-03-15 ENCOUNTER — Telehealth: Payer: Self-pay | Admitting: *Deleted

## 2015-03-15 NOTE — Telephone Encounter (Signed)
Patient is returning your call.  

## 2015-03-15 NOTE — Telephone Encounter (Signed)
Patient has appointment Friday at 11:15 to speak with MD [facility]

## 2015-03-15 NOTE — Telephone Encounter (Signed)
Received letter from [facility] that patient was see for cataract surgery evaluation. 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 Letter confirms patient has risk factors for sleep apnea and has asked patient to see [place] for further evaluation. Spoke with MD Yong Channel and he advises patient to schedule appointment to speak with patient. Attempted to contact patient and left voicemail.

## 2015-03-17 ENCOUNTER — Ambulatory Visit (INDEPENDENT_AMBULATORY_CARE_PROVIDER_SITE_OTHER): Payer: PPO | Admitting: Family Medicine

## 2015-03-17 ENCOUNTER — Encounter: Payer: Self-pay | Admitting: Family Medicine

## 2015-03-17 VITALS — BP 150/60 | HR 62 | Temp 98.7°F | Wt 183.0 lb

## 2015-03-17 DIAGNOSIS — I10 Essential (primary) hypertension: Secondary | ICD-10-CM | POA: Diagnosis not present

## 2015-03-17 DIAGNOSIS — R0683 Snoring: Secondary | ICD-10-CM | POA: Diagnosis not present

## 2015-03-17 MED ORDER — AMLODIPINE BESYLATE 10 MG PO TABS: 10.0000 mg | ORAL_TABLET | Freq: Every day | ORAL | Status: DC

## 2015-03-17 NOTE — Patient Instructions (Addendum)
Send for sleep apnea evaluation with our sleep doctors  Increase amlodipine to 10mg , recheck in November. Home monitoring and if >140/90 return to see [place] earlier than that.   You are welcome to come see me before November if anything comes up. We will get to review full history in November.

## 2015-03-17 NOTE — Progress Notes (Signed)
Tammie Reddish, MD  Subjective:  Tammie [name] is a 80 y.o. year old very pleasant female patient who presents with:  Snoring and Concern for sleep apnea -Complains of daytime sleepiness, nods off oftentimes in the mornings. Has been told she snores heavily. Up 2x a night to pee. In the bed 8 hours except for getting up. We received a letter from her optho stating concern for sleep apnea contributing to open angle glaucoma  Hypertension-poor control on amlodipine 5mg  (states should be taking 10mg )   BP Readings from Last 3 Encounters:  03/17/15 150/60  02/09/15 124/82  09/29/14 159/80   Home BP monitoring-no Compliant with medications-yes without side effects but only taking 1 pill instead of 2 ROS-Denies any CP, SOB (except as related to asthma and improved on meds), LE edema increase (stable at 1+)  Past Medical History- asthma, colorectal cancer resected in 90s, anxiety/depression on paxil, GERD  Medications- reviewed and updated Current Outpatient Prescriptions  Medication Sig Dispense Refill  . amLODipine (NORVASC) 5 MG tablet Take 2 tablets (10 mg total) by mouth daily. (Patient taking differently: Take 5 mg by mouth daily. ) 180 tablet 1  . budesonide-formoterol (SYMBICORT) 160-4.5 MCG/ACT inhaler Inhale 2 puffs into the lungs 2 (two) times daily. 1 Inhaler 3  . fluticasone (FLONASE) 50 MCG/ACT nasal spray Place 2 sprays into both nostrils daily. 16 g 2  . hydrocortisone (PROCTOZONE-HC) 2.5 % rectal cream Place rectally 2 (two) times daily. 30 g 0  . Multiple Vitamins-Minerals (ADULT GUMMY PO) Take 2 each by mouth daily.    . Omega-3 Fatty Acids (FISH OIL) 1200 MG CAPS Take by mouth. DOUBLE STENGTH 1200MG   ---1 CAPSULE ONCE DAILY    . omeprazole (PRILOSEC) 40 MG capsule Take 1 capsule (40 mg total) by mouth daily. 90 capsule 1  . PARoxetine (PAXIL) 20 MG tablet Take 1 tablet (20 mg total) by mouth daily. 90 tablet 0  . vitamin E 100 UNIT capsule Take 100 Units by mouth daily.     [name] albuterol (PROVENTIL HFA;VENTOLIN HFA) 108 (90 BASE) MCG/ACT inhaler Inhale 2 puffs into the lungs every 6 (six) hours as needed for wheezing or shortness of breath. (Patient not taking: Reported on 03/17/2015) 1 Inhaler 0   Objective: BP 150/60 mmHg  Pulse 62  Temp(Src) 98.7 F (37.1 C)  Wt 183 lb (83.008 kg) Gen: NAD, resting comfortably CV: RRR no murmurs rubs or gallops Lungs: CTAB no crackles, wheeze, rhonchi Abdomen: soft/nontender/nondistended/normal bowel sounds.  Ext: 1+ pitting edema Skin: warm, dry Neuro: grossly normal, moves all extremities   Assessment/Plan:  Hypertension Increase amlodipine from 5 to 10mg  due to poor contorl. Want SBP at least less than 150 and higher than this on 2 of last 3 visits.   Snoring and Concern for sleep apnea Referred to sleep medicine  Return precautions advised. See AVS. Watch edema on higher dose amlodipine.   Orders Placed This Encounter  Procedures  . Ambulatory referral to Sleep Studies    Referral Priority:  Routine    Referral Type:  Consultation    Referral Reason:  Specialty Services Required    Number of Visits Requested:  1   Meds ordered this encounter  Medications  . amLODipine (NORVASC) 10 MG tablet    Sig: Take 1 tablet (10 mg total) by mouth daily.    Dispense:  90 tablet    Refill:  3

## 2015-03-17 NOTE — Assessment & Plan Note (Signed)
Increase amlodipine from 5 to 10mg  due to poor contorl. Want SBP at least less than 150 and higher than this on 2 of last 3 visits.

## 2015-05-24 ENCOUNTER — Other Ambulatory Visit: Payer: Self-pay | Admitting: *Deleted

## 2015-05-24 DIAGNOSIS — F411 Generalized anxiety disorder: Secondary | ICD-10-CM

## 2015-05-24 MED ORDER — PAROXETINE HCL 20 MG PO TABS: 20.0000 mg | ORAL_TABLET | Freq: Every day | ORAL | Status: DC

## 2015-05-30 ENCOUNTER — Other Ambulatory Visit: Payer: Self-pay | Admitting: Family Medicine

## 2015-05-30 MED ORDER — FLUTICASONE PROPIONATE 50 MCG/ACT NA SUSP: 2.0000 | Freq: Every day | NASAL | Status: DC

## 2015-05-30 NOTE — Telephone Encounter (Signed)
This medication has never been filled by Dr. Yong Channel.

## 2015-06-19 ENCOUNTER — Telehealth: Payer: Self-pay | Admitting: Family Medicine

## 2015-06-19 NOTE — Telephone Encounter (Signed)
At last visit was not on this medicine. We were increasing amlodipine. If she would like to come in for BP check early (plan was for November) we can see if she needs this added

## 2015-06-19 NOTE — Telephone Encounter (Signed)
[facility] - [place] - [address] [place] [address] [phone]  Requesting refill of Triamterene/HCTZ 37.5-25mg  Cap #30

## 2015-06-19 NOTE — Telephone Encounter (Signed)
Pt was taken off of this medication by a Dr. Jacelyn Grip April of last year. Ok to refill under your name?

## 2015-06-20 NOTE — Telephone Encounter (Signed)
Pt states she notice fluid build up with the increase of her amlodipine and she had some of the triamterine hctz on hand and started to doctor on herself.  She said her BP has been fluctuating so I advised her to be seen due to fluid and bp fluctuations. Pt was then transferred to [facility] for scheduling.

## 2015-06-21 ENCOUNTER — Encounter: Payer: Self-pay | Admitting: Family Medicine

## 2015-06-21 ENCOUNTER — Ambulatory Visit (INDEPENDENT_AMBULATORY_CARE_PROVIDER_SITE_OTHER): Payer: PPO | Admitting: Family Medicine

## 2015-06-21 VITALS — BP 160/70 | HR 68 | Temp 98.2°F | Wt 181.0 lb

## 2015-06-21 DIAGNOSIS — R011 Cardiac murmur, unspecified: Secondary | ICD-10-CM | POA: Insufficient documentation

## 2015-06-21 DIAGNOSIS — I10 Essential (primary) hypertension: Secondary | ICD-10-CM | POA: Diagnosis not present

## 2015-06-21 MED ORDER — AMLODIPINE BESYLATE 5 MG PO TABS: 5.0000 mg | ORAL_TABLET | Freq: Every day | ORAL | Status: DC

## 2015-06-21 MED ORDER — HYDROCHLOROTHIAZIDE 25 MG PO TABS: 25.0000 mg | ORAL_TABLET | Freq: Every day | ORAL | Status: DC

## 2015-06-21 NOTE — Assessment & Plan Note (Signed)
S: not noted previously. Flow murmur due to volume overload? Aortic stenosis contributing to edema? A/P: check echocardiogram. At baseline gets short of breath walking to mailbox-stable over last 3 years since last cardiology visit- she never got planned stress test- she is not ready for this

## 2015-06-21 NOTE — Patient Instructions (Signed)
We will call you within a week about your referral for echocardiogram. If you do not hear within 2 weeks, give [place] a call.   Start back on just amlodipine 5mg  in evening In morning take 25mg  hydrochlorothiazide  Call us 2 weeks after the change and let us know what blood pressure has been daily or drop off a log with name and date of birth  We will see each other in person in November

## 2015-06-21 NOTE — Progress Notes (Signed)
Tammie Reddish, MD  Subjective:  Tammie [name] is a 80 y.o. year old very pleasant female patient who presents for/with See problem oriented charting ROS- no chest pain, shortness of breath above baseline. mild headache when BP gets to 170. no blurry vision  Past Medical History-  Patient Active Problem List   Diagnosis Date Noted  . Hypertension     Priority: Medium  . Depression     Priority: Medium  . Colorectal cancer ([place])     Priority: Medium  . Asthma     Priority: Medium  . Allergic rhinitis     Priority: Low  . GERD 06/22/2009    Priority: Low  . Systolic murmur 38/18/2993    Medications- reviewed and updated Current Outpatient Prescriptions  Medication Sig Dispense Refill  . albuterol (PROVENTIL HFA;VENTOLIN HFA) 108 (90 BASE) MCG/ACT inhaler Inhale 2 puffs into the lungs every 6 (six) hours as needed for wheezing or shortness of breath. (Patient not taking: Reported on 03/17/2015) 1 Inhaler 0  . amLODipine (NORVASC) 10 MG tablet Take 1 tablet (10 mg total) by mouth daily. 90 tablet 3  . budesonide-formoterol (SYMBICORT) 160-4.5 MCG/ACT inhaler Inhale 2 puffs into the lungs 2 (two) times daily. 1 Inhaler 3  . fluticasone (FLONASE) 50 MCG/ACT nasal spray Place 2 sprays into both nostrils daily. 16 g 5  . hydrocortisone (PROCTOZONE-HC) 2.5 % rectal cream Place rectally 2 (two) times daily. 30 g 0  . Multiple Vitamins-Minerals (ADULT GUMMY PO) Take 2 each by mouth daily.    . Omega-3 Fatty Acids (FISH OIL) 1200 MG CAPS Take by mouth. DOUBLE STENGTH 1200MG   ---1 CAPSULE ONCE DAILY    . omeprazole (PRILOSEC) 40 MG capsule Take 1 capsule (40 mg total) by mouth daily. 90 capsule 1  . PARoxetine (PAXIL) 20 MG tablet Take 1 tablet (20 mg total) by mouth daily. 90 tablet 1  . vitamin E 100 UNIT capsule Take 100 Units by mouth daily.     Objective: BP 160/70 mmHg  Pulse 68  Temp(Src) 98.2 F (36.8 C)  Wt 181 lb (82.101 kg) Gen: NAD, resting comfortably CV: RRR 2/6 SEM  heard best LUSB with no radiation. no rubs or gallops Lungs: CTAB no crackles, wheeze, rhonchi Abdomen: soft/nontender/nondistended/normal bowel sounds. No rebound or guarding.  Ext: 1+ edema largely unchanged but more bothersome to patient Skin: warm, dry Neuro: grossly normal, moves all extremities  Assessment/Plan:  Hypertension S: poorly  controlled on amlodipine 10mg . At home ranges from 140/50 to up to 170 SBP. SE edema worsened.  BP Readings from Last 3 Encounters:  06/21/15 160/70  03/17/15 150/60  02/09/15 124/82  A/P: continue amlodipine but reduce to 5mg  in evening only, add hctz. Call in 2 weeks, with HCTZ on board may be able to go back to amlodipine 10mg  total daily. PER JNC8 goal 716/96   Systolic murmur S: not noted previously. Flow murmur due to volume overload? Aortic stenosis contributing to edema? A/P: check echocardiogram. At baseline gets short of breath walking to mailbox-stable over last 3 years since last cardiology visit- she never got planned stress test- she is not ready for this   November in person f/u. Phone in 2 weeks.  Orders Placed This Encounter  Procedures  . Echocardiogram    Standing Status: Future     Number of Occurrences:      Standing Expiration Date: 09/20/2016    Order Specific Question:  Where should this test be performed    Answer:  [facility])    Order Specific Question:  Complete or Limited study?    Answer:  Complete    Order Specific Question:  With Image Enhancing Agent or without Image Enhancing Agent?    Answer:  With Image Enhancing Agent    Order Specific Question:  Reason for exam-Echo    Answer:  Murmur  785.2 / R01.1    Meds ordered this encounter  Medications  . amLODipine (NORVASC) 5 MG tablet    Sig: Take 1-2 tablets (5-10 mg total) by mouth daily. Depending on blood pressure    Dispense:  60 tablet    Refill:  3  . hydrochlorothiazide (HYDRODIURIL) 25 MG tablet    Sig: Take 1 tablet  (25 mg total) by mouth daily.    Dispense:  30 tablet    Refill:  3

## 2015-06-21 NOTE — Assessment & Plan Note (Addendum)
S: poorly  controlled on amlodipine 10mg . At home ranges from 140/50 to up to 170 SBP. SE edema worsened.  BP Readings from Last 3 Encounters:  06/21/15 160/70  03/17/15 150/60  02/09/15 124/82  A/P: continue amlodipine but reduce to 5mg  in evening only, add hctz. Call in 2 weeks, with HCTZ on board may be able to go back to amlodipine 10mg  total daily. PER JNC8 goal 150/90

## 2015-06-30 ENCOUNTER — Other Ambulatory Visit (HOSPITAL_COMMUNITY): Payer: PPO

## 2015-07-05 ENCOUNTER — Other Ambulatory Visit: Payer: Self-pay

## 2015-07-05 ENCOUNTER — Ambulatory Visit (HOSPITAL_COMMUNITY): Payer: PPO | Attending: Cardiology

## 2015-07-05 DIAGNOSIS — I313 Pericardial effusion (noninflammatory): Secondary | ICD-10-CM | POA: Diagnosis not present

## 2015-07-05 DIAGNOSIS — I517 Cardiomegaly: Secondary | ICD-10-CM | POA: Insufficient documentation

## 2015-07-05 DIAGNOSIS — I340 Nonrheumatic mitral (valve) insufficiency: Secondary | ICD-10-CM | POA: Insufficient documentation

## 2015-07-05 DIAGNOSIS — R011 Cardiac murmur, unspecified: Secondary | ICD-10-CM | POA: Diagnosis not present

## 2015-07-05 DIAGNOSIS — I10 Essential (primary) hypertension: Secondary | ICD-10-CM | POA: Diagnosis not present

## 2015-07-07 ENCOUNTER — Telehealth: Payer: Self-pay | Admitting: Family Medicine

## 2015-07-07 NOTE — Telephone Encounter (Signed)
Blood pressure readings range from 584-835 with all diastolic controlled. HR in 50s  Please verify patient is taking 25mg  of HCTZ and 10mg  of amlodipine. If she is not, have her take this regimen and then drop off another log of blood pressures. If she is taking above regimen, let me know as we will need to add another medication

## 2015-07-10 NOTE — Telephone Encounter (Signed)
Left message for patient to return call to office. 

## 2015-07-11 NOTE — Telephone Encounter (Signed)
Called and lm for pt tcb. 

## 2015-07-11 NOTE — Telephone Encounter (Signed)
Lets try to call again today

## 2015-07-12 NOTE — Telephone Encounter (Signed)
Still unable to reach pt

## 2015-07-17 ENCOUNTER — Other Ambulatory Visit: Payer: Self-pay | Admitting: Family Medicine

## 2015-07-17 MED ORDER — OMEPRAZOLE 40 MG PO CPDR: 40.0000 mg | DELAYED_RELEASE_CAPSULE | Freq: Every day | ORAL | Status: DC

## 2015-07-26 ENCOUNTER — Other Ambulatory Visit: Payer: Self-pay

## 2015-08-07 ENCOUNTER — Encounter: Payer: Self-pay | Admitting: Family Medicine

## 2015-08-07 ENCOUNTER — Ambulatory Visit (INDEPENDENT_AMBULATORY_CARE_PROVIDER_SITE_OTHER): Payer: PPO | Admitting: Family Medicine

## 2015-08-07 VITALS — BP 156/60 | HR 65 | Temp 97.9°F | Wt 179.0 lb

## 2015-08-07 DIAGNOSIS — Z780 Asymptomatic menopausal state: Secondary | ICD-10-CM

## 2015-08-07 DIAGNOSIS — K2100 Gastro-esophageal reflux disease with esophagitis, without bleeding: Secondary | ICD-10-CM

## 2015-08-07 DIAGNOSIS — I10 Essential (primary) hypertension: Secondary | ICD-10-CM

## 2015-08-07 DIAGNOSIS — J4540 Moderate persistent asthma, uncomplicated: Secondary | ICD-10-CM | POA: Diagnosis not present

## 2015-08-07 MED ORDER — LISINOPRIL-HYDROCHLOROTHIAZIDE 20-25 MG PO TABS: 1.0000 | ORAL_TABLET | Freq: Every day | ORAL | Status: DC

## 2015-08-07 NOTE — Assessment & Plan Note (Signed)
S: controlled poorly on Amlodipine 5mg , hctz 25mg . Home readings poorly controlled from [id number] systolic BP Readings from Last 3 Encounters:  08/07/15 156/60  06/21/15 160/70  03/17/15 150/60  A/P:Change to lisinopril-hctz 20-25mg , continue amlodipine 5mg . Follow up within 1 month

## 2015-08-07 NOTE — Progress Notes (Signed)
[name], MD  Subjective:  Tammie [name] is a 80 y.o. year old very pleasant female patient who presents for/with See problem oriented charting ROS- No chest pain or shortness of breath (unless asthma active). No headache or blurry vision.   Past Medical History-  Patient Active Problem List   Diagnosis Date Noted  . Hypertension     Priority: Medium  . Depression     Priority: Medium  . History of colorectal cancer     Priority: Medium  . Asthma     Priority: Medium  . Systolic murmur [id number]    Priority: Low  . Allergic rhinitis     Priority: Low  . GERD 06/22/2009    Priority: Low    Medications- reviewed and updated Current Outpatient Prescriptions  Medication Sig Dispense Refill  . albuterol (PROVENTIL HFA;VENTOLIN HFA) 108 (90 BASE) MCG/ACT inhaler Inhale 2 puffs into the lungs every 6 (six) hours as needed for wheezing or shortness of breath. (Patient not taking: Reported on 03/17/2015) 1 Inhaler 0  . amLODipine (NORVASC) 5 MG tablet Take 1-2 tablets (5-10 mg total) by mouth daily. Depending on blood pressure 60 tablet 3  . budesonide-formoterol (SYMBICORT) 160-4.5 MCG/ACT inhaler Inhale 2 puffs into the lungs 2 (two) times daily. 1 Inhaler 3  . fluticasone (FLONASE) 50 MCG/ACT nasal spray Place 2 sprays into both nostrils daily. 16 g 5  . hydrocortisone (PROCTOZONE-HC) 2.5 % rectal cream Place rectally 2 (two) times daily. (Patient not taking: Reported on 06/21/2015) 30 g 0  . lisinopril-hydrochlorothiazide (PRINZIDE,ZESTORETIC) 20-25 MG tablet Take 1 tablet by mouth daily. 30 tablet 5  . Multiple Vitamins-Minerals (ADULT GUMMY PO) Take 2 each by mouth daily.    . Omega-3 Fatty Acids (FISH OIL) 1200 MG CAPS Take by mouth. DOUBLE STENGTH 1200MG   ---1 CAPSULE ONCE DAILY    . omeprazole (PRILOSEC) 40 MG capsule Take 1 capsule (40 mg total) by mouth daily. 90 capsule 1  . PARoxetine (PAXIL) 20 MG tablet Take 1 tablet (20 mg total) by mouth daily. 90 tablet 1  .  vitamin E 100 UNIT capsule Take 100 Units by mouth daily.     No current facility-administered medications for this visit.    Objective: BP 156/60 mmHg  Pulse 65  Temp(Src) 97.9 F (36.6 C)  Wt 179 lb (81.194 kg) Gen: NAD, resting comfortably CV: RRR no murmurs rubs or gallops Lungs: CTAB no crackles, wheeze, rhonchi Abdomen: soft/nontender/nondistended/normal bowel sounds. No rebound or guarding.  Ext: no edema Skin: warm, dry Neuro: grossly normal, moves all extremities  Assessment/Plan:  Hypertension S: controlled poorly on Amlodipine 5mg , hctz 25mg . Home readings poorly controlled from [id number] systolic BP Readings from Last 3 Encounters:  08/07/15 156/60  06/21/15 160/70  03/17/15 150/60  A/P:Change to lisinopril-hctz 20-25mg , continue amlodipine 5mg . Follow up within 1 month   Asthma S: well controlled on Symbicort 160-4.39mcg 2 puffs BID, albuterol prn A/P: continue current rx   GERD S:Prilosec. Dysphagia before started this. Experiences reflux symptoms when comes off medicine A/P: continue current rx. Discussed reported side effects and concerns   Within a month for bp check Return precautions advised.   Orders Placed This Encounter  Procedures  . DG Bone Density    Standing Status: Future     Number of Occurrences:      Standing Expiration Date: 10/06/2016    Order Specific Question:  Reason for Exam (SYMPTOM  OR DIAGNOSIS REQUIRED)    Answer:  post menopausal  Order Specific Question:  Preferred imaging location?    Answer:  [facility]    Meds ordered this encounter  Medications  . lisinopril-hydrochlorothiazide (PRINZIDE,ZESTORETIC) 20-25 MG tablet    Sig: Take 1 tablet by mouth daily.    Dispense:  30 tablet    Refill:  5

## 2015-08-07 NOTE — Assessment & Plan Note (Signed)
S:Prilosec. Dysphagia before started this. Experiences reflux symptoms when comes off medicine A/P: continue current rx. Discussed reported side effects and concerns

## 2015-08-07 NOTE — Assessment & Plan Note (Signed)
S: well controlled on Symbicort 160-4.70mcg 2 puffs BID, albuterol prn A/P: continue current rx

## 2015-08-07 NOTE — Patient Instructions (Addendum)
Schedule bone density at the front desk.  Blood pressure still above goal <150/90 Stop hydrochlorothiazide (water pill) Start lisinopril- hydrochlorothiazide (combo pill with water pill) Continue 5mg  amlodipine See me in 1 month- bring your log of blood pressures

## 2015-08-16 ENCOUNTER — Inpatient Hospital Stay: Admission: RE | Admit: 2015-08-16 | Payer: PPO | Source: Ambulatory Visit

## 2015-09-04 ENCOUNTER — Telehealth: Payer: Self-pay | Admitting: Family Medicine

## 2015-09-04 ENCOUNTER — Encounter: Payer: Self-pay | Admitting: Family Medicine

## 2015-09-04 ENCOUNTER — Ambulatory Visit (INDEPENDENT_AMBULATORY_CARE_PROVIDER_SITE_OTHER): Payer: PPO | Admitting: Family Medicine

## 2015-09-04 VITALS — BP 160/72 | HR 60 | Temp 97.8°F | Wt 183.0 lb

## 2015-09-04 DIAGNOSIS — I10 Essential (primary) hypertension: Secondary | ICD-10-CM | POA: Diagnosis not present

## 2015-09-04 NOTE — Patient Instructions (Signed)
Try to take 2 of the amlodipine 5 mg tablets as well as the hydrochlorothiazide 25mg  (water/Blood pressure pill)  I would like for you to use a home cuff to check at least 5x a week. Your goal is <150/90 but under 140/90 would be even better!. If you note in the next few weeks that it is higher than our goal, see me sooner. Otherwise, see me in 4 weeks. Bring your home cuff and your log of blood pressures with you to visit.   Pneumonia shot when you come back

## 2015-09-04 NOTE — Telephone Encounter (Signed)
Tammie [name] was wondering if Dr. Yong Channel can write a new Rx for her Meclizine and have it sent to [facility]. She says that she still gets inner ear sometimes.

## 2015-09-04 NOTE — Telephone Encounter (Signed)
See below

## 2015-09-04 NOTE — Assessment & Plan Note (Addendum)
S: poorly controlled on amlodipine 7.5mg  alone. Plan was for lisinopril-hctz 20-25mg  in addition to amlodipine last visit after bp not controlled on amlodipine 5mg  and hctz 25mg . Patient instead stopped the hctz completely and did not take new medicine and tried to go back on amlodipine 10mg  slowly. BPs at home 75% above goal <150/90.  BP Readings from Last 3 Encounters:  09/04/15 160/72  08/07/15 156/60  06/21/15 160/70  A/P: increase amlodipine back to 10mg  from 7.5mg . Add back hctz in hopes that this may balance out edema and control BP. If not- may need to trial 1/2 tab of the lisinopril-hctz 20-25mg  and titrate up if tolerated. Patient very fearful of lightheadedness so did not take medicine. Due to orthostatic issus in past- goal <150/90

## 2015-09-04 NOTE — Progress Notes (Signed)
[name], MD  Subjective:  Tammie [name] is a 80 y.o. year old very pleasant female patient who presents for/with See problem oriented charting ROS- No chest pain or shortness of breath. No headache or blurry vision.  Past Medical History-  Patient Active Problem List   Diagnosis Date Noted  . Hypertension     Priority: Medium  . Depression     Priority: Medium  . History of colorectal cancer     Priority: Medium  . Asthma     Priority: Medium  . Systolic murmur [id number]    Priority: Low  . Allergic rhinitis     Priority: Low  . GERD 06/22/2009    Priority: Low    Medications- reviewed and updated  FOR NOW left prior rx until we figure out long term BP regimen. In actuality of time of visit was taking amlodipine 7.5 mg alone.   Current Outpatient Prescriptions  Medication Sig Dispense Refill  . amLODipine (NORVASC) 5 MG tablet Take 1-2 tablets (5-10 mg total) by mouth daily. Depending on blood pressure 60 tablet 3  . budesonide-formoterol (SYMBICORT) 160-4.5 MCG/ACT inhaler Inhale 2 puffs into the lungs 2 (two) times daily. 1 Inhaler 3  . fluticasone (FLONASE) 50 MCG/ACT nasal spray Place 2 sprays into both nostrils daily. 16 g 5  . hydrocortisone (PROCTOZONE-HC) 2.5 % rectal cream Place rectally 2 (two) times daily. 30 g 0  . lisinopril-hydrochlorothiazide (PRINZIDE,ZESTORETIC) 20-25 MG tablet Take 1 tablet by mouth daily. 30 tablet 5  . Multiple Vitamins-Minerals (ADULT GUMMY PO) Take 2 each by mouth daily.    . Omega-3 Fatty Acids (FISH OIL) 1200 MG CAPS Take by mouth. DOUBLE STENGTH 1200MG   ---1 CAPSULE ONCE DAILY    . omeprazole (PRILOSEC) 40 MG capsule Take 1 capsule (40 mg total) by mouth daily. 90 capsule 1  . PARoxetine (PAXIL) 20 MG tablet Take 1 tablet (20 mg total) by mouth daily. 90 tablet 1  . vitamin E 100 UNIT capsule Take 100 Units by mouth daily.    [name] albuterol (PROVENTIL HFA;VENTOLIN HFA) 108 (90 BASE) MCG/ACT inhaler Inhale 2 puffs into the lungs  every 6 (six) hours as needed for wheezing or shortness of breath. (Patient not taking: Reported on 03/17/2015) 1 Inhaler 0   No current facility-administered medications for this visit.    Objective: BP 160/72 mmHg  Pulse 60  Temp(Src) 97.8 F (36.6 C)  Wt 183 lb (83.008 kg) Gen: NAD, resting comfortably CV: RRR no murmurs rubs or gallops Lungs: CTAB no crackles, wheeze, rhonchi Abdomen: soft/nontender/nondistended/normal bowel sounds. No rebound or guarding.  Ext: 1+ edema with venous stasis changes (minimal edema noted last visit on 5mg  amlodipine) Skin: warm, dry Neuro: grossly normal, moves all extremities  Assessment/Plan:  Hypertension S: poorly controlled on amlodipine 7.5mg  alone. Plan was for lisinopril-hctz 20-25mg  in addition to amlodipine last visit after bp not controlled on amlodipine 5mg  and hctz 25mg . Patient instead stopped the hctz completely and did not take new medicine and tried to go back on amlodipine 10mg  slowly. BPs at home 75% above goal <150/90.  BP Readings from Last 3 Encounters:  09/04/15 160/72  08/07/15 156/60  06/21/15 160/70  A/P: increase amlodipine back to 10mg  from 7.5mg . Add back hctz in hopes that this may balance out edema and control BP. If not- may need to trial 1/2 tab of the lisinopril-hctz 20-25mg  and titrate up if tolerated. Patient very fearful of lightheadedness so did not take medicine. Due to orthostatic issus in  past- goal <150/90    1 month BP fu Return precautions advised.   Health Maintenance Due  Topic Date Due  . DEXA SCAN - scheduled in january 09/08/2002  . PNA vac Low Risk Adult (2 of 2 - PPSV23)- wants to do after [facility] 08/10/2015

## 2015-09-04 NOTE — Telephone Encounter (Signed)
Yes thanks 

## 2015-09-05 ENCOUNTER — Other Ambulatory Visit: Payer: Self-pay | Admitting: Family Medicine

## 2015-09-05 MED ORDER — MECLIZINE HCL 25 MG PO TABS: 25.0000 mg | ORAL_TABLET | Freq: Two times a day (BID) | ORAL | Status: DC | PRN

## 2015-09-05 NOTE — Telephone Encounter (Signed)
I sent it in for her. Remind her to bring this up at visit in January because I do not see this listed in prior meds.

## 2015-09-05 NOTE — Telephone Encounter (Signed)
Lm pn pt vm making her aware of below message.

## 2015-09-05 NOTE — Telephone Encounter (Signed)
What strength dosage and instructions? Didn't see this medicine on her list.

## 2015-09-27 ENCOUNTER — Ambulatory Visit (INDEPENDENT_AMBULATORY_CARE_PROVIDER_SITE_OTHER)
Admission: RE | Admit: 2015-09-27 | Discharge: 2015-09-27 | Disposition: A | Discharge status: Home / Self Care | Payer: PPO | Source: Ambulatory Visit | Attending: Family Medicine | Admitting: Family Medicine

## 2015-09-27 DIAGNOSIS — Z780 Asymptomatic menopausal state: Secondary | ICD-10-CM | POA: Diagnosis not present

## 2015-10-02 ENCOUNTER — Encounter: Payer: Self-pay | Admitting: Family Medicine

## 2015-10-02 DIAGNOSIS — M8580 Other specified disorders of bone density and structure, unspecified site: Secondary | ICD-10-CM | POA: Insufficient documentation

## 2015-10-10 ENCOUNTER — Other Ambulatory Visit: Payer: Self-pay | Admitting: Family Medicine

## 2015-10-10 MED ORDER — FLUTICASONE PROPIONATE 50 MCG/ACT NA SUSP: 2.0000 | Freq: Every day | NASAL | Status: DC

## 2015-10-10 NOTE — Telephone Encounter (Signed)
Approved by Dr. Yong Channel on 05/30/15 and sent to [facility]. Today sent in to [place] for 3 months.

## 2015-10-12 ENCOUNTER — Other Ambulatory Visit: Payer: Self-pay | Admitting: Family Medicine

## 2015-10-12 ENCOUNTER — Telehealth: Payer: Self-pay | Admitting: Family Medicine

## 2015-10-12 NOTE — Telephone Encounter (Signed)
Pt would a call back about her bone density results

## 2015-10-12 NOTE — Telephone Encounter (Signed)
Printed off bone density results for pt.

## 2015-10-19 ENCOUNTER — Ambulatory Visit (INDEPENDENT_AMBULATORY_CARE_PROVIDER_SITE_OTHER): Payer: PPO | Admitting: Family Medicine

## 2015-10-19 ENCOUNTER — Encounter: Payer: Self-pay | Admitting: Family Medicine

## 2015-10-19 VITALS — BP 150/60 | HR 63 | Temp 97.6°F | Wt 181.0 lb

## 2015-10-19 DIAGNOSIS — Z23 Encounter for immunization: Secondary | ICD-10-CM | POA: Diagnosis not present

## 2015-10-19 DIAGNOSIS — I10 Essential (primary) hypertension: Secondary | ICD-10-CM | POA: Diagnosis not present

## 2015-10-19 NOTE — Patient Instructions (Addendum)
Final pneumonia shot received today ([id number]).  Continue 1.5 of the amlodipine (7.5mg ) Stop the hydrochlorothiazide 25mg  after today Tomorrow- start the lisinopril-hydrochlorothiazide 20-25mg  but only take a half a pill.   I would like for you to use a home cuff to check at least 5x a week. Your goal is <150/90 but under 140/90 would be even better!. If you note in the next few weeks that it is higher than our goal, see me sooner. Otherwise, see me in 4 weeks. Bring your home cuff and your log of blood pressures with you to visit.

## 2015-10-19 NOTE — Progress Notes (Signed)
Tammie Reddish, MD  Subjective:  Tammie [name] is a 81 y.o. year old very pleasant female patient who presents for/with See problem oriented charting ROS- No chest pain or shortness of breath. No  blurry vision.   Past Medical History-  Patient Active Problem List   Diagnosis Date Noted  . Hypertension     Priority: Medium  . Depression     Priority: Medium  . History of colorectal cancer     Priority: Medium  . Asthma     Priority: Medium  . Systolic murmur [id number]    Priority: Low  . Allergic rhinitis     Priority: Low  . GERD 06/22/2009    Priority: Low  . Osteopenia 10/02/2015    Medications- reviewed and updated Current Outpatient Prescriptions  Medication Sig Dispense Refill  . amLODipine (NORVASC) 5 MG tablet Take 1-2 tablets (5-10 mg total) by mouth daily. Depending on blood pressure 60 tablet 3  . budesonide-formoterol (SYMBICORT) 160-4.5 MCG/ACT inhaler Inhale 2 puffs into the lungs 2 (two) times daily. 1 Inhaler 3  . fluticasone (FLONASE) 50 MCG/ACT nasal spray Place 2 sprays into both nostrils daily. 16 g 2  . hydrocortisone (PROCTOZONE-HC) 2.5 % rectal cream Place rectally 2 (two) times daily. 30 g 0  . lisinopril-hydrochlorothiazide (PRINZIDE,ZESTORETIC) 20-25 MG tablet Take 1 tablet by mouth daily. 30 tablet 5  . Multiple Vitamins-Minerals (ADULT GUMMY PO) Take 2 each by mouth daily.    . Omega-3 Fatty Acids (FISH OIL) 1200 MG CAPS Take by mouth. DOUBLE STENGTH 1200MG   ---1 CAPSULE ONCE DAILY    . omeprazole (PRILOSEC) 40 MG capsule TAKE 1 CAPSULE BY MOUTH EVERY DAY 90 capsule 2  . PARoxetine (PAXIL) 20 MG tablet Take 1 tablet (20 mg total) by mouth daily. 90 tablet 1  . vitamin E 100 UNIT capsule Take 100 Units by mouth daily.    [name] albuterol (PROVENTIL HFA;VENTOLIN HFA) 108 (90 BASE) MCG/ACT inhaler Inhale 2 puffs into the lungs every 6 (six) hours as needed for wheezing or shortness of breath. (Patient not taking: Reported on 03/17/2015) 1 Inhaler 0  .  meclizine (ANTIVERT) 25 MG tablet Take 1 tablet (25 mg total) by mouth 2 (two) times daily as needed for dizziness (vertigo). (Patient not taking: Reported on 10/19/2015) 30 tablet 0   No current facility-administered medications for this visit.    Objective: BP 150/60 mmHg  Pulse 63  Temp(Src) 97.6 F (36.4 C)  Wt 181 lb (82.101 kg) Gen: NAD, resting comfortably CV: RRR no murmurs rubs or gallops Lungs: CTAB no crackles, wheeze, rhonchi Abdomen: soft/nontender/nondistended/normal bowel sounds. No rebound or guarding.  Ext: 1+ edema stable Skin: warm, dry Neuro: grossly normal, moves all extremities  Assessment/Plan:  Hypertension S: Patient tried 10mg  amlodipine but became too sleepy on this. Went back to 7.5mg  and improved. Taking hctz 25mg . 60% of home readings above 150/90.  BP Readings from Last 3 Encounters:  10/19/15 150/60  09/04/15 160/72  08/07/15 156/60  A/P: Poor control in office and at home. Continue amlodipine 7.5mg  as has not tolerated higher. Change to lisinopril 20-25mg  with half pill only with follow up in 1 month. Going slow due to concern of orthostasis and SE sleepiness which she has had on BP meds in past- we will likely have to increase to full pill next visit 1 month   1 month. Return precautions advised.   Orders Placed This Encounter  Procedures  . Pneumococcal polysaccharide vaccine 23-valent greater than or equal to  2yo subcutaneous/IM    >50% of 15 minute office visit was spent on counseling (checking BP, importance of BP control, importance of compliance) and coordination of care

## 2015-10-19 NOTE — Assessment & Plan Note (Signed)
S: Patient tried 10mg  amlodipine but became too sleepy on this. Went back to 7.5mg  and improved. Taking hctz 25mg . 60% of home readings above 150/90.  BP Readings from Last 3 Encounters:  10/19/15 150/60  09/04/15 160/72  08/07/15 156/60  A/P: Poor control in office and at home. Continue amlodipine 7.5mg  as has not tolerated higher. Change to lisinopril 20-25mg  with half pill only with follow up in 1 month. Going slow due to concern of orthostasis and SE sleepiness which she has had on BP meds in past- we will likely have to increase to full pill next visit 1 month

## 2015-10-23 ENCOUNTER — Encounter: Payer: Self-pay | Admitting: Family Medicine

## 2015-10-23 ENCOUNTER — Ambulatory Visit (INDEPENDENT_AMBULATORY_CARE_PROVIDER_SITE_OTHER): Payer: PPO | Admitting: Family Medicine

## 2015-10-23 VITALS — BP 130/70 | HR 71 | Temp 97.8°F | Ht 66.0 in

## 2015-10-23 DIAGNOSIS — N939 Abnormal uterine and vaginal bleeding, unspecified: Secondary | ICD-10-CM | POA: Diagnosis not present

## 2015-10-23 DIAGNOSIS — R300 Dysuria: Secondary | ICD-10-CM

## 2015-10-23 LAB — POCT URINALYSIS DIPSTICK
Bilirubin, UA: NEGATIVE
Glucose, UA: NEGATIVE
KETONES UA: NEGATIVE
Leukocytes, UA: NEGATIVE
Nitrite, UA: NEGATIVE
PROTEIN UA: 15
RBC UA: NEGATIVE
SPEC GRAV UA: 1.025
Urobilinogen, UA: 0.2
pH, UA: 6

## 2015-10-23 NOTE — Patient Instructions (Signed)
BEFORE YOU LEAVE: -urine culture -gynecology referral appointment details if possible -follow up with PCP in 2-3 months  -We placed a referral for you as discussed to the gynecologist regarding the vaginal bleeding. It usually takes about 1-2 weeks to process and schedule this referral. If you have not heard from [place] regarding this appointment in 2 weeks please contact our office.  -We have ordered labs or studies at this visit. It can take up to 1-2 weeks for results and processing. We will contact you with instructions IF your results are abnormal. Normal results will be released to your [facility]. If you have not heard from [place] or can not find your results in [facility] in 2 weeks please contact our office.

## 2015-10-23 NOTE — Addendum Note (Signed)
Addended by: Agnes Lawrence on: 10/23/2015 01:47 PM   Modules accepted: Orders

## 2015-10-23 NOTE — Progress Notes (Signed)
Pre visit review using our clinic review tool, if applicable. No additional management support is needed unless otherwise documented below in the visit note. 

## 2015-10-23 NOTE — Progress Notes (Signed)
HPI:  Tammie [name] is a pleasant 81 yo F with PMH of HTN, anxiety, GERD, colon ca and asthma here as a walk in for an acute visit for several issues:  1) Dysuria: -for 1 week -mild frequency, urgency -denies: fevers, malaise, flank pain, abd pain, nausea, vomiting, hematuria -has hx non-obstructing nephrolithiasis per her report  2) Vaginal bleeding: -on and off for several years - occurs several times per year to once per month -bright red vaginal bleeding last occurred 1 week ago -reports no prior work up for this -denies: sexual activity, pain, fevers, malaise, changes in bowels, unexplained wt loss, vuvlovag pruritis or discharge otherwise  ROS: See pertinent positives and negatives per HPI.  Past Medical History  Diagnosis Date  . Anxiety disorder   . Hemorrhoids   . GERD (gastroesophageal reflux disease)   . Hypertension   . Colorectal cancer ([place]) 1997    T3, N0  . Allergic rhinitis   . Asthma   . Allergy     seasonal  . Depression     pt states she isn't depressed anymore, continues paxil daily  . Cataract     left eye    Past Surgical History  Procedure Laterality Date  . Colectomy  1997    SIGMOID COLECTOMY 1997  . Dilation and curettage of uterus      D & C  . Tonsillectomy    . Colonoscopy    . Polypectomy      Family History  Problem Relation Age of Onset  . Sudden death Brother 16    Apparent DVT/PE. all males  . Sudden death Cousin 30    Paternal cousin -dvt/pe 7 all males  . Coronary artery disease Cousin 80    Paternal cousin, MI  . Varicose Veins Mother   . Colon cancer Neg Hx   . Rectal cancer Neg Hx   . Stomach cancer Neg Hx   . Breast cancer Daughter 23    Social History   Social History  . Marital Status: Divorced    Spouse Name: N/A  . Number of Children: N/A  . Years of Education: N/A   Social History Main Topics  . Smoking status: Never Smoker   . Smokeless tobacco: Never Used  . Alcohol Use: No  . Drug Use: No   . Sexual Activity: Not Asked   Other Topics Concern  . None   Social History Narrative   Divorced. Lives alone.  Two children. 2 granddaughters (67 and 62 years old in 2016)   Family of 7 including ex-husband      Shows houses (Artist- Radiographer, therapeutic and rental properties)- shows rentals      Hobbies: painting- enjoys classes, writing, photography, sewing     Current outpatient prescriptions:  .  albuterol (PROVENTIL HFA;VENTOLIN HFA) 108 (90 BASE) MCG/ACT inhaler, Inhale 2 puffs into the lungs every 6 (six) hours as needed for wheezing or shortness of breath., Disp: 1 Inhaler, Rfl: 0 .  amLODipine (NORVASC) 5 MG tablet, Take 1-2 tablets (5-10 mg total) by mouth daily. Depending on blood pressure, Disp: 60 tablet, Rfl: 3 .  budesonide-formoterol (SYMBICORT) 160-4.5 MCG/ACT inhaler, Inhale 2 puffs into the lungs 2 (two) times daily., Disp: 1 Inhaler, Rfl: 3 .  fluticasone (FLONASE) 50 MCG/ACT nasal spray, Place 2 sprays into both nostrils daily., Disp: 16 g, Rfl: 2 .  hydrocortisone (PROCTOZONE-HC) 2.5 % rectal cream, Place rectally 2 (two) times daily., Disp: 30 g, Rfl: 0 .  lisinopril-hydrochlorothiazide (PRINZIDE,ZESTORETIC) 20-25 MG tablet, Take 1 tablet by mouth daily., Disp: 30 tablet, Rfl: 5 .  meclizine (ANTIVERT) 25 MG tablet, Take 1 tablet (25 mg total) by mouth 2 (two) times daily as needed for dizziness (vertigo)., Disp: 30 tablet, Rfl: 0 .  Multiple Vitamins-Minerals (ADULT GUMMY PO), Take 2 each by mouth daily., Disp: , Rfl:  .  Omega-3 Fatty Acids (FISH OIL) 1200 MG CAPS, Take by mouth. DOUBLE STENGTH 1200MG   ---1 CAPSULE ONCE DAILY, Disp: , Rfl:  .  omeprazole (PRILOSEC) 40 MG capsule, TAKE 1 CAPSULE BY MOUTH EVERY DAY, Disp: 90 capsule, Rfl: 2 .  PARoxetine (PAXIL) 20 MG tablet, Take 1 tablet (20 mg total) by mouth daily., Disp: 90 tablet, Rfl: 1 .  vitamin E 100 UNIT capsule, Take 100 Units by mouth daily., Disp: , Rfl:   EXAM:  Filed Vitals:   10/23/15 1305   BP: 130/70  Pulse: 71  Temp: 97.8 F (36.6 C)    There is no weight on file to calculate BMI.  GENERAL: vitals reviewed and listed above, alert, oriented, appears well hydrated and in no acute distress  HEENT: atraumatic, conjunttiva clear, no obvious abnormalities on inspection of external nose and ears  NECK: no obvious masses on inspection  LUNGS: clear to auscultation bilaterally, no wheezes, rales or rhonchi, good air movement  CV: HRRR, no peripheral edema  ABD: no cva TTP, BS+, NTTP  MS: moves all extremities without noticeable abnormality  PSYCH: pleasant and cooperative, no obvious depression or anxiety  ASSESSMENT AND PLAN:  Discussed the following assessment and plan:  Dysuria - Plan: POC Urinalysis Dipstick  Vaginal bleeding - Plan: Ambulatory referral to Gynecology  -udip ok -we discussed possible serious and likely etiologies, workup and treatment, treatment risks and return precautions -after this discussion, Ensleigh opted for urgent referral to gyn for eval postmenopausal bleeding, urine culture pending -follow up advised with PCP in 2-3 months -of course, we advised Makiyah  to return or notify a doctor immediately if symptoms worsen or persist or new concerns arise. -Patient advised to return or notify a doctor immediately if symptoms worsen or persist or new concerns arise.  Patient Instructions  BEFORE YOU LEAVE: -urine culture -gynecology referral appointment details if possible -follow up with PCP in 2-3 months  -We placed a referral for you as discussed to the gynecologist regarding the vaginal bleeding. It usually takes about 1-2 weeks to process and schedule this referral. If you have not heard from [place] regarding this appointment in 2 weeks please contact our office.  -We have ordered labs or studies at this visit. It can take up to 1-2 weeks for results and processing. We will contact you with instructions IF your results are abnormal. Normal  results will be released to your [facility]. If you have not heard from [place] or can not find your results in [facility] in 2 weeks please contact our office.            Colin Benton R.

## 2015-10-24 ENCOUNTER — Telehealth: Payer: Self-pay | Admitting: Obstetrics and Gynecology

## 2015-10-24 NOTE — Telephone Encounter (Signed)
Called and left a message for patient to call back to schedule a new patient doctor referral. °

## 2015-10-25 ENCOUNTER — Ambulatory Visit: Payer: PPO | Admitting: Obstetrics and Gynecology

## 2015-10-25 LAB — URINE CULTURE

## 2015-10-30 ENCOUNTER — Telehealth: Payer: Self-pay

## 2015-10-30 ENCOUNTER — Ambulatory Visit (INDEPENDENT_AMBULATORY_CARE_PROVIDER_SITE_OTHER): Payer: PPO | Admitting: Adult Health

## 2015-10-30 ENCOUNTER — Encounter: Payer: Self-pay | Admitting: Adult Health

## 2015-10-30 VITALS — BP 130/70 | HR 58 | Temp 98.8°F | Ht 66.0 in | Wt 181.0 lb

## 2015-10-30 DIAGNOSIS — J4541 Moderate persistent asthma with (acute) exacerbation: Secondary | ICD-10-CM | POA: Diagnosis not present

## 2015-10-30 MED ORDER — BENZONATATE 200 MG PO CAPS: 200.0000 mg | ORAL_CAPSULE | Freq: Three times a day (TID) | ORAL | Status: DC | PRN

## 2015-10-30 MED ORDER — ALBUTEROL SULFATE HFA 108 (90 BASE) MCG/ACT IN AERS: 2.0000 | INHALATION_SPRAY | Freq: Four times a day (QID) | RESPIRATORY_TRACT | Status: DC | PRN

## 2015-10-30 MED ORDER — BUDESONIDE-FORMOTEROL FUMARATE 160-4.5 MCG/ACT IN AERO: 2.0000 | INHALATION_SPRAY | Freq: Two times a day (BID) | RESPIRATORY_TRACT | Status: DC

## 2015-10-30 MED ORDER — METHYLPREDNISOLONE 4 MG PO TBPK: ORAL_TABLET | ORAL | Status: DC

## 2015-10-30 NOTE — Telephone Encounter (Signed)
[facility] Night - Client TELEPHONE ADVICE RECORD [facility] Patient Name: Tammie [name] Gender: Female DOB: 07/08/1935 Age: 81 Y 41 M 18 D Return Phone Number: [phone] (Primary) Address: City/State/Zip: [place] [address] Client [facility] Night - Client Client Site [facility] - Night Physician [name] Contact Type Call Who Is Calling Patient / Member / Family / Caregiver Call Type Triage / Clinical Relationship To Patient Self Return Phone Number [phone] (Primary) Chief Complaint WHEEZING Reason for Call Symptomatic / Request for [place] states has been sick since last Tuesday, asthma is acting up. Coughing and wheezing quite a bit PreDisposition Call Doctor Translation No Nurse Assessment Nurse: Christel Mormon, RN, Levada Dy Date/Time (Eastern Time): 10/28/2015 12:02:35 PM Confirm and document reason for call. If symptomatic, describe symptoms. You must click the next button to save text entered. ---Caller states she was in the office on Thursday a week ago to keep a check on her HTN. She also got the last of the pneumonia shot. The following Friday she started feeling bad and started coughing. This past Tuesday she started vomiting all night and the cough was "awful". She did not come to the office. She states all this coughing, her asthma flared up. She did not expect anyone to see her today but she needs a refill of her Albuterol Inhaler and that has helped. Last night the wheezing got worse. She states she has enough to last until Monday. She is NOT wanting an appointment. Has the patient traveled out of the country within the last 30 days? ---No Does the patient have any new or worsening symptoms? ---Yes Will a triage be completed? ---Yes Related visit to physician within the last 2 weeks? ---Yes Does the PT have any chronic conditions? (i.e.  diabetes, asthma, etc.) ---Yes List chronic conditions. ---asthma, HTN, Is this a behavioral health or substance abuse call? ---No Guidelines Guideline Title Affirmed Question Affirmed Notes Nurse Date/Time (Eastern Time) Asthma Attack [1] MILD asthma attack (e.g., no SOB at rest, [place], [place], Levada Dy 10/28/2015 12:07:34 PM PLEASE NOTE: All timestamps contained within this report are represented as [place] Standard Time. CONFIDENTIALTY NOTICE: This fax transmission is intended only for the addressee. It contains information that is legally privileged, confidential or otherwise protected from use or disclosure. If you are not the intended recipient, you are strictly prohibited from reviewing, disclosing, copying using or disseminating any of this information or taking any action in reliance on or regarding this information. If you have received this fax in error, please notify us immediately by telephone so that we can arrange for its return to [place]. Phone: [phone], Toll-Free: [phone], Fax: [phone] Page: 2 of 2 Call Id: [id number] Guidelines Guideline Title Affirmed Question Affirmed Notes Nurse Date/Time Eilene Ghazi Time) mild SOB with walking, speaks normally in sentences, mild wheezing) AND [2] persists > 24 hours on appropriate treatment Disp. Time Eilene Ghazi Time) Disposition Final User 10/28/2015 12:01:21 PM Send to Urgent Toni Arthurs 10/28/2015 12:14:29 PM See Physician within [place], [place], Levada Dy 10/28/2015 12:14:31 PM See Physician within 24 Hours Yes Christel Mormon, RN, [place] Shutter Understands: Yes Disagree/Comply: Comply Care Advice Given Per Guideline SEE PHYSICIAN WITHIN 24 HOURS: ASTHMA LONG-TERM-CONTROL MEDICINE: If you are using a controller medicine (e.g., inhaled steroids or cromolyn), continue to take it as directed. ASTHMA QUICK-RELIEF MEDICINE: * Start your quick-relief medicine (e.g., albuterol, salbutamol) at the first sign of any coughing or  shortness of breath (don't  wait for wheezing). DRINKING FLUIDS AND USING A HUMIDIFIER: * Drink a normal amount of liquids (e.g., water). Being adequately hydrated makes it easier to cough up the sticky lung mucus. CALL BACK IF: * Wheezing is not improved after neb or inhaler * Inhaled asthma medicine (neb or MDI) is needed more often than every 4 hours * Wheezing is not completely cleared by 5 days * You become worse. CARE ADVICE given per Asthma Attack (Adult) guideline. After Care Instructions Given Call Event Type User Date / Time Description Education document email Felipa Emory 10/28/2015 12:19:19 PM [facility] Connect Now Instructions Comments User: Donnie Aho, RN Date/Time Eilene Ghazi Time): 10/28/2015 12:15:01 PM Needs refill on Symbicort and ProAir inhalers. Referrals REFERRED TO PCP OFFICE

## 2015-10-30 NOTE — Telephone Encounter (Signed)
Tammie [name]

## 2015-10-30 NOTE — Telephone Encounter (Signed)
Noted  

## 2015-10-30 NOTE — Telephone Encounter (Signed)
Dr Maudie Mercury reviewed this and stated the Tammie [name] needs an appt as this could be a virus but since she is having shortness of breath she should be seen.  I called the Tammie [name] and informed her of this and she stated she has an appt to see Tommi Rumps today at 3pm.

## 2015-10-30 NOTE — Progress Notes (Signed)
Pre visit review using our clinic review tool, if applicable. No additional management support is needed unless otherwise documented below in the visit note. 

## 2015-10-30 NOTE — Progress Notes (Signed)
Subjective:    Patient ID: Tammie [name], female    DOB: 1935/01/13, 81 y.o.   MRN: [id number]  HPI  81 year old female who presents to the office today with the complaint of cough x 11 days. Her cough has become worse over the the last week and now she feels like she is short of breath and is wheezing. She has been using her prescribed Symbicort  Has not used any other medications.   Denies any fevers, feeling acutely ill, or diarrhea. She did have a couple of episodes of vomiting but this can be contributed to coughing spells.   Review of Systems  Constitutional: Negative.   HENT: Positive for congestion. Negative for postnasal drip, rhinorrhea and sinus pressure.   Eyes: Negative.   Respiratory: Positive for cough, shortness of breath and wheezing.   Cardiovascular: Negative.   Neurological: Negative.   Hematological: Negative.   All other systems reviewed and are negative.  Past Medical History  Diagnosis Date  . Anxiety disorder   . Hemorrhoids   . GERD (gastroesophageal reflux disease)   . Hypertension   . Colorectal cancer ([place]) 1997    T3, N0  . Allergic rhinitis   . Asthma   . Allergy     seasonal  . Depression     pt states she isn't depressed anymore, continues paxil daily  . Cataract     left eye    Social History   Social History  . Marital Status: Divorced    Spouse Name: N/A  . Number of Children: N/A  . Years of Education: N/A   Occupational History  . Not on file.   Social History Main Topics  . Smoking status: Never Smoker   . Smokeless tobacco: Never Used  . Alcohol Use: No  . Drug Use: No  . Sexual Activity: Not on file   Other Topics Concern  . Not on file   Social History Narrative   Divorced. Lives alone.  Two children. 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 Colectomy  1997    SIGMOID COLECTOMY 1997  . Dilation and curettage of uterus      D & C  . Tonsillectomy    . Colonoscopy    . Polypectomy      Family History  Problem Relation Age of Onset  . Sudden death Brother 41    Apparent DVT/PE. all males  . Sudden death Cousin 28    Paternal cousin -dvt/pe 7 all males  . Coronary artery disease Cousin 65    Paternal cousin, MI  . Varicose Veins Mother   . Colon cancer Neg Hx   . Rectal cancer Neg Hx   . Stomach cancer Neg Hx   . Breast cancer Daughter 17    Allergies  Allergen Reactions  . Erythromycin   . Fentanyl   . Heparin     REACTION: questionable  . Hydrocodone-Acetaminophen   . Meperidine Hcl   . Penicillins   . Procaine Hcl   . Promethazine Hcl   . Temazepam     Current Outpatient Prescriptions on File Prior to Visit  Medication Sig Dispense Refill  . amLODipine (NORVASC) 5 MG tablet  Take 1-2 tablets (5-10 mg total) by mouth daily. Depending on blood pressure 60 tablet 3  . fluticasone (FLONASE) 50 MCG/ACT nasal spray Place 2 sprays into both nostrils daily. 16 g 2  . hydrocortisone (PROCTOZONE-HC) 2.5 % rectal cream Place rectally 2 (two) times daily. 30 g 0  . meclizine (ANTIVERT) 25 MG tablet Take 1 tablet (25 mg total) by mouth 2 (two) times daily as needed for dizziness (vertigo). 30 tablet 0  . Multiple Vitamins-Minerals (ADULT GUMMY PO) Take 2 each by mouth daily.    . Omega-3 Fatty Acids (FISH OIL) 1200 MG CAPS Take by mouth. DOUBLE STENGTH 1200MG   ---1 CAPSULE ONCE DAILY    . omeprazole (PRILOSEC) 40 MG capsule TAKE 1 CAPSULE BY MOUTH EVERY DAY 90 capsule 2  . PARoxetine (PAXIL) 20 MG tablet Take 1 tablet (20 mg total) by mouth daily. 90 tablet 1  . vitamin E 100 UNIT capsule Take 100 Units by mouth daily.    [name] lisinopril-hydrochlorothiazide (PRINZIDE,ZESTORETIC) 20-25 MG tablet Take 1 tablet by mouth daily.  (Patient not taking: Reported on 10/30/2015) 30 tablet 5   No current facility-administered medications on file prior to visit.    BP 130/70 mmHg  Pulse 58  Temp(Src) 98.8 F (37.1 C) (Oral)  Ht 5\' 6"  (1.676 m)  Wt 181 lb (82.101 kg)  BMI 29.23 kg/m2  SpO2 96%       Objective:   Physical Exam  Constitutional: She is oriented to person, place, and time. She appears well-developed and well-nourished. No distress.  Cardiovascular: Normal rate, regular rhythm, normal heart sounds and intact distal pulses.  Exam reveals no gallop and no friction rub.   No murmur heard. Pulmonary/Chest: Effort normal. No respiratory distress. She has wheezes in the right middle field, the right lower field, the left middle field and the left lower field. She has no rhonchi. She has no rales. She exhibits no tenderness.  Musculoskeletal: Normal range of motion. She exhibits no edema or tenderness.  Neurological: She is alert and oriented to person, place, and time.  Skin: Skin is warm and dry. No rash noted. She is not diaphoretic. No erythema. No pallor.  Psychiatric: She has a normal mood and affect. Her behavior is normal. Thought content normal.  Nursing note and vitals reviewed.      Assessment & Plan:  1. Asthma, moderate persistent, with acute exacerbation - budesonide-formoterol (SYMBICORT) 160-4.5 MCG/ACT inhaler; Inhale 2 puffs into the lungs 2 (two) times daily.  Dispense: 1 Inhaler; Refill: 3 - albuterol (PROVENTIL HFA;VENTOLIN HFA) 108 (90 Base) MCG/ACT inhaler; Inhale 2 puffs into the lungs every 6 (six) hours as needed for wheezing or shortness of breath.  Dispense: 1 Inhaler; Refill: 0 - methylPREDNISolone (MEDROL DOSEPAK) 4 MG TBPK tablet; Take as directed  Dispense: 21 tablet; Refill: 0 - benzonatate (TESSALON) 200 MG capsule; Take 1 capsule (200 mg total) by mouth 3 (three) times daily as needed for cough.  Dispense: 20 capsule; Refill: 3 - Follow up with PCP if no improvement in the  next 2-3 days.  - Go to the ER with any increasing SOB or feeling as though your throat is closing.

## 2015-10-30 NOTE — Patient Instructions (Addendum)
It was great seeing you again today!  Your exam is consistent with an asthma flare.   I have sent in prescriptions for   1) Prednisone 2) Tessalon Pearls 3) Symbicort  4) Albuterol  Please follow up if no improvement in the next 2-3 days.     Asthma, Adult Asthma is a recurring condition in which the airways tighten and narrow. Asthma can make it difficult to breathe. It can cause coughing, wheezing, and shortness of breath. Asthma episodes, also called asthma attacks, range from minor to life-threatening. Asthma cannot be cured, but medicines and lifestyle changes can help control it. CAUSES Asthma is believed to be caused by inherited (genetic) and environmental factors, but its exact cause is unknown. Asthma may be triggered by allergens, lung infections, or irritants in the air. Asthma triggers are different for each person. Common triggers include:   Animal dander.  Dust mites.  Cockroaches.  Pollen from trees or grass.  Mold.  Smoke.  Air pollutants such as dust, household cleaners, hair sprays, aerosol sprays, paint fumes, strong chemicals, or strong odors.  Cold air, weather changes, and winds (which increase molds and pollens in the air).  Strong emotional expressions such as crying or laughing hard.  Stress.  Certain medicines (such as aspirin) or types of drugs (such as beta-blockers).  Sulfites in foods and drinks. Foods and drinks that may contain sulfites include dried fruit, potato chips, and sparkling grape juice.  Infections or inflammatory conditions such as the flu, a cold, or an inflammation of the nasal membranes (rhinitis).  Gastroesophageal reflux disease (GERD).  Exercise or strenuous activity. SYMPTOMS Symptoms may occur immediately after asthma is triggered or many hours later. Symptoms include:  Wheezing.  Excessive nighttime or early morning coughing.  Frequent or severe coughing with a common cold.  Chest tightness.  Shortness  of breath. DIAGNOSIS  The diagnosis of asthma is made by a review of your medical history and a physical exam. Tests may also be performed. These may include:  Lung function studies. These tests show how much air you breathe in and out.  Allergy tests.  Imaging tests such as X-rays. TREATMENT  Asthma cannot be cured, but it can usually be controlled. Treatment involves identifying and avoiding your asthma triggers. It also involves medicines. There are 2 classes of medicine used for asthma treatment:   Controller medicines. These prevent asthma symptoms from occurring. They are usually taken every day.  Reliever or rescue medicines. These quickly relieve asthma symptoms. They are used as needed and provide short-term relief. Your health care provider will help you create an asthma action plan. An asthma action plan is a written plan for managing and treating your asthma attacks. It includes a list of your asthma triggers and how they may be avoided. It also includes information on when medicines should be taken and when their dosage should be changed. An action plan may also involve the use of a device called a peak flow meter. A peak flow meter measures how well the lungs are working. It helps you monitor your condition. HOME CARE INSTRUCTIONS   Take medicines only as directed by your health care provider. Speak with your health care provider if you have questions about how or when to take the medicines.  Use a peak flow meter as directed by your health care provider. Record and keep track of readings.  Understand and use the action plan to help minimize or stop an asthma attack without needing to seek  medical care.  Control your home environment in the following ways to help prevent asthma attacks:  Do not smoke. Avoid being exposed to secondhand smoke.  Change your heating and air conditioning filter regularly.  Limit your use of fireplaces and wood stoves.  Get rid of pests (such  as roaches and mice) and their droppings.  Throw away plants if you see mold on them.  Clean your floors and dust regularly. Use unscented cleaning products.  Try to have someone else vacuum for you regularly. Stay out of rooms while they are being vacuumed and for a short while afterward. If you vacuum, use a dust mask from a hardware store, a double-layered or microfilter vacuum cleaner bag, or a vacuum cleaner with a HEPA filter.  Replace carpet with wood, tile, or vinyl flooring. Carpet can trap dander and dust.  Use allergy-proof pillows, mattress covers, and box spring covers.  Wash bed sheets and blankets every week in hot water and dry them in a dryer.  Use blankets that are made of polyester or cotton.  Clean bathrooms and kitchens with bleach. If possible, have someone repaint the walls in these rooms with mold-resistant paint. Keep out of the rooms that are being cleaned and painted.  Wash hands frequently. SEEK MEDICAL CARE IF:   You have wheezing, shortness of breath, or a cough even if taking medicine to prevent attacks.  The colored mucus you cough up (sputum) is thicker than usual.  Your sputum changes from clear or white to yellow, green, gray, or bloody.  You have any problems that may be related to the medicines you are taking (such as a rash, itching, swelling, or trouble breathing).  You are using a reliever medicine more than 2-3 times per week.  Your peak flow is still at 50-79% of your personal best after following your action plan for 1 hour.  You have a fever. SEEK IMMEDIATE MEDICAL CARE IF:   You seem to be getting worse and are unresponsive to treatment during an asthma attack.  You are short of breath even at rest.  You get short of breath when doing very little physical activity.  You have difficulty eating, drinking, or talking due to asthma symptoms.  You develop chest pain.  You develop a fast heartbeat.  You have a bluish color to your  lips or fingernails.  You are light-headed, dizzy, or faint.  Your peak flow is less than 50% of your personal best.   This information is not intended to replace advice given to you by your health care provider. Make sure you discuss any questions you have with your health care provider.   Document Released: 09/02/2005 Document Revised: 05/24/2015 Document Reviewed: 04/01/2013 Elsevier Interactive Patient Education [employer].

## 2015-10-30 NOTE — Telephone Encounter (Signed)
Dr Ivar Drape you address this since Dr Yong Channel is out of the office?

## 2015-10-31 ENCOUNTER — Telehealth: Payer: Self-pay | Admitting: Obstetrics and Gynecology

## 2015-10-31 NOTE — Telephone Encounter (Signed)
Called and left a message for patient to call back to schedule a new patient doctor referral. °

## 2015-11-01 NOTE — Telephone Encounter (Signed)
Called and left a message for patient to call back to schedule a new patient doctor referral. °

## 2015-11-08 ENCOUNTER — Encounter: Payer: Self-pay | Admitting: Obstetrics and Gynecology

## 2015-11-08 ENCOUNTER — Ambulatory Visit (INDEPENDENT_AMBULATORY_CARE_PROVIDER_SITE_OTHER): Payer: PPO | Admitting: Obstetrics and Gynecology

## 2015-11-08 VITALS — BP 132/78 | HR 72 | Resp 15 | Ht 63.0 in | Wt 177.0 lb

## 2015-11-08 DIAGNOSIS — N950 Postmenopausal bleeding: Secondary | ICD-10-CM

## 2015-11-08 DIAGNOSIS — N393 Stress incontinence (female) (male): Secondary | ICD-10-CM | POA: Diagnosis not present

## 2015-11-08 DIAGNOSIS — N840 Polyp of corpus uteri: Secondary | ICD-10-CM | POA: Diagnosis not present

## 2015-11-08 LAB — POCT URINALYSIS DIPSTICK
Bilirubin, UA: NEGATIVE
Blood, UA: NEGATIVE
Glucose, UA: NEGATIVE
Ketones, UA: NEGATIVE
Nitrite, UA: NEGATIVE
PROTEIN UA: NEGATIVE
UROBILINOGEN UA: NEGATIVE
pH, UA: 6.5

## 2015-11-08 NOTE — Progress Notes (Signed)
Patient ID: Tammie [name], female   DOB: 1935/06/21, 81 y.o.   MRN: [id number] 81 y.o. [id number] DivorcedCaucasianF here for a consultation from Dr Maudie Mercury for postmenospause bleeding.  She reports intermittent vaginal bleeding for the last 14 years. Initially just pink and mild. She reports a normal pap 14 years ago, normal u/s other than a fibroid. Her primary gave her premarin cream 14 years ago for atrophy, but she never started it. She spots about once every month or 2, just a pink stain. One month ago she noticed bright red blood on her mini-pad (she has a h/o mild GSI so she wears a pad). She wasn't sure where the blood was coming from. No pain. She doesn't notice blood in the toilet when she voids. The one day she had red blood, she noticed blood when she wiped. Typically is seems like a pink vaginal discharge.  She had a negative urinalysis and culture 3 weeks ago. Not sexually active.     No LMP recorded. Patient is postmenopausal.          Sexually active: No.  The current method of family planning is post menopausal status.    Exercising: No.  The patient does not participate in regular exercise at present. Smoker:  no  Health Maintenance: Pap:  2002 WNL Per patient History of abnormal Pap:  Yes, h/o dysplasia. She doesn't remember how or if it was treated.  MMG:  1-6=14 WNl Colonoscopy:  09-29-14 WNL per patient BMD:   08/2015 osteopenia  TDaP:  unsure Gardasil: N/A   reports that she has never smoked. She has never used smokeless tobacco. She reports that she does not drink alcohol or use illicit drugs.  Past Medical History  Diagnosis Date  . Anxiety disorder   . Hemorrhoids   . GERD (gastroesophageal reflux disease)   . Hypertension   . Colorectal cancer ([place]) 1997    T3, N0  . Allergic rhinitis   . Asthma   . Allergy     seasonal  . Depression     pt states she isn't depressed anymore, continues paxil daily  . Cataract     left eye  . Endometriosis   . Fibroid   .  Thyroid disease   . Urinary incontinence   . Abnormal uterine bleeding   . Anxiety     Past Surgical History  Procedure Laterality Date  . Colectomy  1997    SIGMOID COLECTOMY 1997  . Dilation and curettage of uterus      D & C  . Tonsillectomy    . Colonoscopy    . Polypectomy    . Colon surgery    The D&C was in 1972 for an SAB  Current Outpatient Prescriptions  Medication Sig Dispense Refill  . albuterol (PROVENTIL HFA;VENTOLIN HFA) 108 (90 Base) MCG/ACT inhaler Inhale 2 puffs into the lungs every 6 (six) hours as needed for wheezing or shortness of breath. 1 Inhaler 0  . amLODipine (NORVASC) 5 MG tablet Take 1-2 tablets (5-10 mg total) by mouth daily. Depending on blood pressure 60 tablet 3  . budesonide-formoterol (SYMBICORT) 160-4.5 MCG/ACT inhaler Inhale 2 puffs into the lungs 2 (two) times daily. 1 Inhaler 3  . clobetasol cream (TEMOVATE) 0.05 % APPLY TO AFFECTED AREA UP TO 2 TIMES DAILY AS NEEDED. DO not APPLY TO THE FACE, GROIN, OR axilla.  3  . fluticasone (FLONASE) 50 MCG/ACT nasal spray Place 2 sprays into both nostrils daily. 16 g 2  .  hydrocortisone (PROCTOZONE-HC) 2.5 % rectal cream Place rectally 2 (two) times daily. 30 g 0  . meclizine (ANTIVERT) 25 MG tablet Take 1 tablet (25 mg total) by mouth 2 (two) times daily as needed for dizziness (vertigo). 30 tablet 0  . Multiple Vitamins-Minerals (ADULT GUMMY PO) Take 2 each by mouth daily.    . Omega-3 Fatty Acids (FISH OIL) 1200 MG CAPS Take by mouth. DOUBLE STENGTH 1200MG   ---1 CAPSULE ONCE DAILY    . PARoxetine (PAXIL) 20 MG tablet Take 1 tablet (20 mg total) by mouth daily. 90 tablet 1  . vitamin E 100 UNIT capsule Take 100 Units by mouth daily.    [name] lisinopril-hydrochlorothiazide (PRINZIDE,ZESTORETIC) 20-25 MG tablet Take 1 tablet by mouth daily. (Patient not taking: Reported on 10/30/2015) 30 tablet 5  . omeprazole (PRILOSEC) 40 MG capsule TAKE 1 CAPSULE BY MOUTH EVERY DAY 90 capsule 2   No current  facility-administered medications for this visit.    Family History  Problem Relation Age of Onset  . Sudden death Brother 59    Apparent DVT/PE. all males  . Heart attack Brother   . Sudden death Cousin 85    Paternal cousin -dvt/pe 7 all males  . Coronary artery disease Cousin 74    Paternal cousin, MI  . Varicose Veins Mother   . Colon cancer Neg Hx   . Rectal cancer Neg Hx   . Stomach cancer Neg Hx   . Breast cancer Daughter 32  . Hypertension Father     Review of Systems  Constitutional: Negative.   HENT: Negative.   Eyes: Negative.   Respiratory: Negative.   Cardiovascular: Negative.   Gastrointestinal: Negative.   Endocrine: Negative.   Genitourinary: Negative.        Post menopause bleeding   Musculoskeletal: Negative.   Skin: Negative.   Allergic/Immunologic: Negative.   Neurological: Negative.   Psychiatric/Behavioral: Negative.   She c/o long term, mild GSI, leaks a small amount daily. Uses a mini-pad. Tolerable.   Exam:   BP 132/78 mmHg  Pulse 72  Resp 15  Ht 5\' 3"  (1.6 m)  Wt 177 lb (80.287 kg)  BMI 31.36 kg/m2  Weight change: @WEIGHTCHANGE @ Height:   Height: 5\' 3"  (160 cm)  Ht Readings from Last 3 Encounters:  11/08/15 5\' 3"  (1.6 m)  10/30/15 5\' 6"  (1.676 m)  10/23/15 5\' 6"  (1.676 m)    General appearance: alert, cooperative and appears stated age Head: Normocephalic, without obvious abnormality, atraumatic Neck: no adenopathy, supple, symmetrical, trachea midline and thyroid normal to inspection and palpation Lungs: clear to auscultation bilaterally Heart: regular rate and rhythm Abdomen: soft, non-tender; bowel sounds normal; no masses,  no organomegaly Extremities: extremities normal, atraumatic, no cyanosis or edema Skin: Skin color, texture, turgor normal. No rashes or lesions Lymph nodes: Cervical, supraclavicular, and axillary nodes normal. No abnormal inguinal nodes palpated Neurologic: Grossly normal   Pelvic: External genitalia:   no lesions              Urethra:  normal appearing urethra with no masses, tenderness or lesions              Bartholins and Skenes: normal                 Vagina: normal appearing vagina with normal color and discharge, no lesions              Cervix: no lesions  Bimanual Exam:  Uterus:  not appreciable enlarged, limited by BMI, mobile              Adnexa: no mass, fullness, tenderness               Rectovaginal: Confirms               Anus:  normal sphincter tone, no lesions  The risks of endometrial biopsy were reviewed and a consent was obtained.  A speculum was placed in the vagina and the cervix was cleansed with betadine. A tenaculum was placed on the cervix and the mini-pipelle was placed into the endometrial cavity. The uterus sounded to 7-8 cm. The endometrial biopsy was performed, minimal tissue was obtained, two passes were made (definately in the cavity). The tenaculum and speculum were removed. There were no complications.    Chaperone was present for exam.  A:  Postmenopausal bleeding  GSI, mild, tolerable and long term  P:   Endometrial biopsy  Return for an ultrasound, possible sonohysterogram  Urine dip negative for blood   CC: Dr Maudie Mercury, letter sent

## 2015-11-09 ENCOUNTER — Telehealth: Payer: Self-pay | Admitting: Obstetrics and Gynecology

## 2015-11-09 NOTE — Telephone Encounter (Signed)
Please inform the patient that her biopsy returned with a portion of endometrial polyp. She has 2 options, one is to still come in for the ultrasound. Sometimes the polyps are found on biopsy, but so small we don't see them on ultrasound. The other option is to skip the ultrasound and schedule hysteroscopy, d&c. It is likely that she will need the hysteroscopy, D&C. She can come in to discuss the options further if desired.

## 2015-11-09 NOTE — Telephone Encounter (Signed)
Routing to Dr. Jertson 

## 2015-11-09 NOTE — Telephone Encounter (Signed)
Routing to [place] for review and advised. Biopsy results have returned and are available for review in EPIC. Results indicate benign endometrial type polyp. Pap smear is still pending. Routing to [place] for any further recommendations prior to contacting the patient to schedule her SHGM.

## 2015-11-09 NOTE — Telephone Encounter (Signed)
Spoke with patient regarding benefits for ultrasound. Patient understands, but requested to wait least two weeks to scheduled. She also wants to obtained biopsy results prior to scheduling. Forward to triage for review

## 2015-11-10 LAB — IPS PAP TEST WITH REFLEX TO HPV

## 2015-11-10 NOTE — Telephone Encounter (Signed)
Spoke with patient. Advised of message as seen below from [place]. She is agreeable and verbalizes understanding. She would like to skip the ultrasound and proceed with scheduling hysteroscopy, D&C at this time. Advised I will notify Dr.Jertson so that the process can be started to get her scheduled. She is agreeable and verbalizes understanding.  Cc: Lavone Nian  Routing to provider for final review. Patient agreeable to disposition. Will close encounter.

## 2015-11-13 ENCOUNTER — Telehealth: Payer: Self-pay | Admitting: Obstetrics and Gynecology

## 2015-11-13 NOTE — Telephone Encounter (Signed)
Spoke with pt regarding benefit for surgery. Patient understood and agreeable. Patient ready to schedule. Benefits do not required surgery deposit, but patient is aware of $250 cancellation fee.. Patient aware this is professional benefit only. Patient aware will be contacted by hospital for separate benefits. Staff message to [facility] for scheduling

## 2015-11-14 ENCOUNTER — Telehealth: Payer: Self-pay | Admitting: *Deleted

## 2015-11-14 NOTE — Telephone Encounter (Signed)
Call to patient. Surgery date options discussed. Patient will discuss these with her daughter and call back.

## 2015-11-15 NOTE — Telephone Encounter (Signed)
Call to patient. Advised will know in am if surgery date of 11-21-15 is available. Still pending hospital approval. Will call her back tomorrow.

## 2015-11-15 NOTE — Telephone Encounter (Signed)
Return call to patient. She would like to proceed with surgery on 11-21-15 if possible. Advised will need to check to see if this date is still available and call her back.

## 2015-11-15 NOTE — Telephone Encounter (Signed)
Return call to Sally. °

## 2015-11-16 NOTE — Telephone Encounter (Addendum)
Call from hospital. Confirms surgery scheduled for 11-21-15. Call to patient and notified surgery scheduled for 0715 on 11-21-15 and to arrive at 0600.  Surgery instructions reviewed and patient is scheduled for hospital pre-op appointment on Monday 11-20-15. Office consult also scheduled for Monday 11-20-15 with Dr Talbert Nan.   Routing to provider for final review. Encounter closed.

## 2015-11-17 ENCOUNTER — Telehealth: Payer: Self-pay | Admitting: Family Medicine

## 2015-11-17 ENCOUNTER — Telehealth: Payer: Self-pay | Admitting: Obstetrics and Gynecology

## 2015-11-17 MED ORDER — AZITHROMYCIN 250 MG PO TABS: ORAL_TABLET | ORAL | Status: DC

## 2015-11-17 NOTE — Telephone Encounter (Signed)
Pt call to say she think she has a sinus infection and is asking to come today because she is having surgery tues 11/21/15   Can she be worked in this afternoon or see someone else

## 2015-11-17 NOTE — Telephone Encounter (Signed)
Patient is supposed to start a z pak today for a sinus/ear infection and is scheduled for surgery on Tuesday. She is wondering if she should reschedule surgery until she is better.

## 2015-11-17 NOTE — Telephone Encounter (Signed)
See below

## 2015-11-17 NOTE — Telephone Encounter (Signed)
FYI Keba  Symptoms for over a month. Double sickening last week. Sinus pressure mostly on left, congestion, now with left ear pain as well. Surgery on 11/21/15 (considering pushing back). Drainage is clear. Similar experiences in past cleared with azithromycin (erythromycin allergy listed but patient states has had no issues with azithromycin). No fever or shortness of breath.   Suspect Left sided sinus infection and potential otitis media. Treat with azithromycin x 5 days- I called in. Should be seen in office if symptoms do not resolve.

## 2015-11-17 NOTE — Telephone Encounter (Signed)
This is a question for anesthesia. It is possible they may choose to cancel  Cc- Dr. Talbert Nan

## 2015-11-17 NOTE — Telephone Encounter (Signed)
Patient has office visit with Dr Talbert Nan at 1245 on 11-20-15 and hospital pre-op to follow at 2:15 . Call to patient, VM has number confirmation. Left message that we will evaluate on Monday at appointment and in consult with anesthesia to make decision. Continue as planned for now.   Encounter closed.

## 2015-11-17 NOTE — Telephone Encounter (Signed)
Patient is scheduled for a D&C/Hysteroscopy with Myosure on 11/21/2015 with Dr.Jertson. She has a sinus/ear infection and has been prescribed a Z-pak to start today. Patient is calling for advice on if she needs to reschedule surgery or if she will be okay to proceed on 11/21/2015.  Routing to Dr.Silva as Dr.Jerston is out of the office today.

## 2015-11-20 ENCOUNTER — Encounter: Payer: Self-pay | Admitting: Obstetrics and Gynecology

## 2015-11-20 ENCOUNTER — Encounter (HOSPITAL_COMMUNITY): Payer: Self-pay

## 2015-11-20 ENCOUNTER — Other Ambulatory Visit: Payer: Self-pay

## 2015-11-20 ENCOUNTER — Ambulatory Visit (INDEPENDENT_AMBULATORY_CARE_PROVIDER_SITE_OTHER): Payer: PPO | Admitting: Obstetrics and Gynecology

## 2015-11-20 ENCOUNTER — Encounter (HOSPITAL_COMMUNITY)
Admission: RE | Admit: 2015-11-20 | Discharge: 2015-11-20 | Disposition: A | Discharge status: Home / Self Care | Payer: PPO | Source: Ambulatory Visit | Attending: Obstetrics and Gynecology | Admitting: Obstetrics and Gynecology

## 2015-11-20 VITALS — BP 154/78 | HR 72 | Temp 97.7°F | Resp 15 | Wt 179.0 lb

## 2015-11-20 DIAGNOSIS — Z880 Allergy status to penicillin: Secondary | ICD-10-CM | POA: Diagnosis not present

## 2015-11-20 DIAGNOSIS — K219 Gastro-esophageal reflux disease without esophagitis: Secondary | ICD-10-CM | POA: Diagnosis not present

## 2015-11-20 DIAGNOSIS — J45909 Unspecified asthma, uncomplicated: Secondary | ICD-10-CM | POA: Diagnosis not present

## 2015-11-20 DIAGNOSIS — N840 Polyp of corpus uteri: Secondary | ICD-10-CM | POA: Diagnosis not present

## 2015-11-20 DIAGNOSIS — I10 Essential (primary) hypertension: Secondary | ICD-10-CM | POA: Diagnosis not present

## 2015-11-20 DIAGNOSIS — N950 Postmenopausal bleeding: Secondary | ICD-10-CM

## 2015-11-20 HISTORY — DX: Other seasonal allergic rhinitis: J30.2

## 2015-11-20 HISTORY — DX: Hyperlipidemia, unspecified: E78.5

## 2015-11-20 LAB — COMPREHENSIVE METABOLIC PANEL
ALK PHOS: 81 U/L (ref 38–126)
ALT: 26 U/L (ref 14–54)
ANION GAP: 6 (ref 5–15)
AST: 30 U/L (ref 15–41)
Albumin: 4.4 g/dL (ref 3.5–5.0)
BILIRUBIN TOTAL: 0.9 mg/dL (ref 0.3–1.2)
BUN: 11 mg/dL (ref 6–20)
CALCIUM: 9.6 mg/dL (ref 8.9–10.3)
CO2: 29 mmol/L (ref 22–32)
Chloride: 104 mmol/L (ref 101–111)
Creatinine, Ser: 0.63 mg/dL (ref 0.44–1.00)
GFR calc non Af Amer: >60 mL/min (ref >60)
Glucose, Bld: 124 mg/dL — ABNORMAL HIGH (ref 65–99)
Potassium: 4.3 mmol/L (ref 3.5–5.1)
SODIUM: 139 mmol/L (ref 135–145)
TOTAL PROTEIN: 8.1 g/dL (ref 6.5–8.1)

## 2015-11-20 LAB — CBC
HCT: 40.6 % (ref 36.0–46.0)
HEMOGLOBIN: 14.8 g/dL (ref 12.0–15.0)
MCH: 32.5 pg (ref 26.0–34.0)
MCHC: 36.5 g/dL — AB (ref 30.0–36.0)
MCV: 89 fL (ref 78.0–100.0)
Platelets: 231 10*3/uL (ref 150–400)
RBC: 4.56 MIL/uL (ref 3.87–5.11)
RDW: 13.5 % (ref 11.5–15.5)
WBC: 9.3 10*3/uL (ref 4.0–10.5)

## 2015-11-20 NOTE — Anesthesia Preprocedure Evaluation (Addendum)
Anesthesia Evaluation  Patient identified by MRN, date of birth, ID band Patient awake    Reviewed: Allergy & Precautions, H&P , Patient's Chart, lab work & pertinent test results, reviewed documented beta blocker date and time   Airway Mallampati: II  TM Distance: >3 FB Neck ROM: full    Dental no notable dental hx.    Pulmonary asthma ,    Pulmonary exam normal breath sounds clear to auscultation       Cardiovascular hypertension, On Medications  Rhythm:regular Rate:Normal     Neuro/Psych    GI/Hepatic GERD  ,  Endo/Other  diabetes  Renal/GU      Musculoskeletal   Abdominal   Peds  Hematology   Anesthesia Other Findings GERD     Hypertension       Asthma  Will use  inhaler Allergy  seasonal ; Recently on Z pak. No cough and feel well. Her lungs are clear.   Depression    Reproductive/Obstetrics                            Anesthesia Physical Anesthesia Plan  ASA: II  Anesthesia Plan:    Post-op Pain Management:    Induction: Intravenous  Airway Management Planned: LMA  Additional Equipment:   Intra-op Plan:   Post-operative Plan:   Informed Consent: I have reviewed the patients History and Physical, chart, labs and discussed the procedure including the risks, benefits and alternatives for the proposed anesthesia with the patient or authorized representative who has indicated his/her understanding and acceptance.   Dental Advisory Given and Dental advisory given  Plan Discussed with: CRNA and Surgeon  Anesthesia Plan Comments: (Discussed GA with LMA, possible sore throat, potential need to switch to ETT, N/V, pulmonary aspiration. Questions answered. )        Anesthesia Quick Evaluation

## 2015-11-20 NOTE — H&P (Signed)
Patient ID: Tammie [name], female DOB: Nov 03, 1934, 81 y.o. MRN: [id number] GYNECOLOGY VISIT  HPI: 81 y.o. Divorced Caucasian female  [phone] with No LMP recorded. Patient is postmenopausal.  The patient has a h/o PMP bleeding. Endometrial biopsy returned with endometrial polyp (minimal sample). She is here to further discuss her options.  She is on a Z pack for a sinus infection, tomorrow is her last day. She has intermittent coughing fits. Doesn't feel great, feels run down, but improving. No fevers. She has a productive cough, clear.   GYNECOLOGIC HISTORY: No LMP recorded. Patient is postmenopausal. Contraception:postmenopase Menopausal hormone therapy: none   OB History    Gravida Para Term Preterm AB TAB SAB Ectopic Multiple Living   4 3 3  1  1   2        Patient Active Problem List   Diagnosis Date Noted  . Osteopenia 10/02/2015  . Systolic murmur [id number]  . Hypertension   . Depression   . History of colorectal cancer   . Allergic rhinitis   . Asthma   . GERD 06/22/2009    Past Medical History  Diagnosis Date  . Anxiety disorder   . Hemorrhoids   . GERD (gastroesophageal reflux disease)   . Hypertension   . Colorectal cancer ([place]) 1997    T3, N0  . Allergic rhinitis   . Asthma   . Allergy     seasonal  . Depression     pt states she isn't depressed anymore, continues paxil daily  . Cataract     left eye  . Endometriosis   . Fibroid   . Thyroid disease   . Urinary incontinence   . Abnormal uterine bleeding   . Anxiety     Past Surgical History  Procedure Laterality Date  . Colectomy  1997    SIGMOID COLECTOMY 1997  . Dilation and curettage of uterus      D & C  . Tonsillectomy    . Colonoscopy    . Polypectomy    . Colon surgery      Current Outpatient  Prescriptions  Medication Sig Dispense Refill  . albuterol (PROVENTIL HFA;VENTOLIN HFA) 108 (90 Base) MCG/ACT inhaler Inhale 2 puffs into the lungs every 6 (six) hours as needed for wheezing or shortness of breath. 1 Inhaler 0  . amLODipine (NORVASC) 5 MG tablet Take 1-2 tablets (5-10 mg total) by mouth daily. Depending on blood pressure 60 tablet 3  . azithromycin (ZITHROMAX) 250 MG tablet Take 2 tabs on day 1, then 1 tab daily until finished 6 tablet 0  . budesonide-formoterol (SYMBICORT) 160-4.5 MCG/ACT inhaler Inhale 2 puffs into the lungs 2 (two) times daily. 1 Inhaler 3  . fluticasone (FLONASE) 50 MCG/ACT nasal spray Place 2 sprays into both nostrils daily. (Patient taking differently: Place 1 spray into both nostrils 2 (two) times daily. ) 16 g 2  . hydrocortisone (PROCTOZONE-HC) 2.5 % rectal cream Place rectally 2 (two) times daily. (Patient taking differently: Place 1 application rectally 2 (two) times daily as needed for hemorrhoids. ) 30 g 0  . lisinopril-hydrochlorothiazide (PRINZIDE,ZESTORETIC) 20-25 MG tablet Take 1 tablet by mouth daily. (Patient taking differently: Take 0.5 tablets by mouth daily. ) 30 tablet 5  . meclizine (ANTIVERT) 25 MG tablet Take 1 tablet (25 mg total) by mouth 2 (two) times daily as needed for dizziness (vertigo). 30 tablet 0  . omeprazole (PRILOSEC) 40 MG capsule TAKE 1 CAPSULE BY MOUTH EVERY DAY 90 capsule 2  .  PARoxetine (PAXIL) 20 MG tablet Take 1 tablet (20 mg total) by mouth daily. (Patient taking differently: Take 10 mg by mouth daily. ) 90 tablet 1   No current facility-administered medications for this visit.     ALLERGIES: Erythromycin; Fentanyl; Heparin; Hydrocodone-acetaminophen; Meperidine hcl; Penicillins; Procaine hcl; Promethazine hcl; and Temazepam  Family History  Problem Relation Age of Onset  . Sudden death Brother 60    Apparent DVT/PE. all males  . Heart attack  Brother   . Sudden death Cousin 22    Paternal cousin -dvt/pe 7 all males  . Coronary artery disease Cousin 78    Paternal cousin, MI  . Varicose Veins Mother   . Colon cancer Neg Hx   . Rectal cancer Neg Hx   . Stomach cancer Neg Hx   . Breast cancer Daughter 63  . Hypertension Father     Social History   Social History  . Marital Status: Divorced    Spouse Name: N/A  . Number of Children: N/A  . Years of Education: N/A   Occupational History  . Not on file.   Social History Main Topics  . Smoking status: Never Smoker   . Smokeless tobacco: Never Used  . Alcohol Use: No  . Drug Use: No  . Sexual Activity: No   Other Topics Concern  . Not on file   Social History Narrative   Divorced. Lives alone. Two children. 2 granddaughters (42 and 9 years old in 2016)   Family of 7 including ex-husband      Shows houses (Artist- Radiographer, therapeutic and rental properties)- shows rentals      Hobbies: painting- enjoys classes, writing, photography, sewing    Review of Systems  Constitutional: Positive for malaise/fatigue.  HENT:   Cough Sinusitis  Eyes: Negative.  Respiratory: Negative.  Cardiovascular: Negative.  Gastrointestinal: Negative.  Genitourinary:   Unscheduled bleeding  Musculoskeletal: Negative.  Skin: Negative.  Neurological: Positive for dizziness.  Endo/Heme/Allergies: Negative.  Psychiatric/Behavioral: Negative.    PHYSICAL EXAMINATION:   BP 154/78 mmHg  Pulse 72  Temp(Src) 97.7 F (36.5 C)  Resp 15  Wt 179 lb (81.194 kg)  General appearance: alert, cooperative and appears stated age Heart: regular rate and rhythm, + systolic ejection murmur  Lungs: CTAB Abdomen: soft, non-tender; bowel sounds normal; no masses, no organomegaly Lungs: CTAB Extremities: normal, atraumatic, no cyanosis Skin: normal color,  texture and turgor, no rashes or lesions Lymph: normal cervical supraclavicular and inguinal nodes Neurologic: grossly normal Sinuses: not tender to palpation     ASSESSMENT Postmenopausal bleeding, endometrial polyp on endometrial biopsy. Discussed the options with the patient of doing a sonohysterogram or a hysteroscopy, possilble polypectomy, and D&C. Discussed that sometimes the polyps are microscopic and she [name] or [name] not have a visible polyp at hysteroscopy. She would like to proceed with hysteroscopy. Recovering from a sinus infection, 1 more day of antibiotics. Lungs are CTAB, no fevers, clear mucous.    PLAN Plan: hysteroscopy, possible polypectomy, dilation and curettage. Reviewed risks, including: bleeding, infection, uterine perforation, fluid overload, need for further sugery Will proceed with surgery as long as Anesthesia agrees  An After Visit Summary was printed and given to the patient.  25 minutes face to face time of which over 50% was spent in counseling.

## 2015-11-20 NOTE — Patient Instructions (Addendum)
Your procedure is scheduled on:  Tuesday, March 7  Enter through the [employer] [facility] at: [employer] up the phone at the desk and dial [phone].  Call this number if you have problems the morning of surgery: [phone].  Remember: Do NOT eat food after midnight tonight (Monday) Do NOT drink clear liquids after midnight tonight (Monday) Take these medicines the morning of surgery with a SIP OF WATER:  Use inhalers and nasal spray if needed on the morning of surgery.  Bring albuterol inhaler with you on day of surgery.  Patient states she takes amlodipine, lisinopril/hctz, omeprazole and paxil and night.  No meds to take morning of surgery.  Do NOT wear jewelry (body piercing), metal hair clips/bobby pins, make-up, or nail polish. Do NOT wear lotions, powders, or perfumes.  You may wear deoderant. Do NOT shave for 48 hours prior to surgery. Do NOT bring valuables to the hospital. Contacts, dentures, or bridgework may not be worn into surgery.  Have a responsible adult drive you home and stay with you for 24 hours after your procedure.  Home with daughter Tammie [name] cell [phone]

## 2015-11-20 NOTE — Progress Notes (Signed)
Patient ID: Tammie [name], female   DOB: Jan 14, 1935, 81 y.o.   MRN: [id number] GYNECOLOGY  VISIT   HPI: 81 y.o.   Divorced  Caucasian  female   [phone] with No LMP recorded. Patient is postmenopausal.   The patient has a h/o PMP bleeding. Endometrial biopsy returned with endometrial polyp (minimal sample). She is here to further discuss her options.  She is on a Z pack for a sinus infection, tomorrow is her last day. She has intermittent coughing fits. Doesn't feel great, feels run down, but improving. No fevers. She has a productive cough, clear.   GYNECOLOGIC HISTORY: No LMP recorded. Patient is postmenopausal. Contraception:postmenopase Menopausal hormone therapy: none        OB History    Gravida Para Term Preterm AB TAB SAB Ectopic Multiple Living   4 3 3  1  1   2          Patient Active Problem List   Diagnosis Date Noted  . Osteopenia 10/02/2015  . Systolic murmur [id number]  . Hypertension   . Depression   . History of colorectal cancer   . Allergic rhinitis   . Asthma   . GERD 06/22/2009    Past Medical History  Diagnosis Date  . Anxiety disorder   . Hemorrhoids   . GERD (gastroesophageal reflux disease)   . Hypertension   . Colorectal cancer ([place]) 1997    T3, N0  . Allergic rhinitis   . Asthma   . Allergy     seasonal  . Depression     pt states she isn't depressed anymore, continues paxil daily  . Cataract     left eye  . Endometriosis   . Fibroid   . Thyroid disease   . Urinary incontinence   . Abnormal uterine bleeding   . Anxiety     Past Surgical History  Procedure Laterality Date  . Colectomy  1997    SIGMOID COLECTOMY 1997  . Dilation and curettage of uterus      D & C  . Tonsillectomy    . Colonoscopy    . Polypectomy    . Colon surgery      Current Outpatient Prescriptions  Medication Sig Dispense Refill  . albuterol (PROVENTIL HFA;VENTOLIN HFA) 108 (90 Base) MCG/ACT inhaler Inhale 2 puffs into the lungs every 6 (six) hours as  needed for wheezing or shortness of breath. 1 Inhaler 0  . amLODipine (NORVASC) 5 MG tablet Take 1-2 tablets (5-10 mg total) by mouth daily. Depending on blood pressure 60 tablet 3  . azithromycin (ZITHROMAX) 250 MG tablet Take 2 tabs on day 1, then 1 tab daily until finished 6 tablet 0  . budesonide-formoterol (SYMBICORT) 160-4.5 MCG/ACT inhaler Inhale 2 puffs into the lungs 2 (two) times daily. 1 Inhaler 3  . fluticasone (FLONASE) 50 MCG/ACT nasal spray Place 2 sprays into both nostrils daily. (Patient taking differently: Place 1 spray into both nostrils 2 (two) times daily. ) 16 g 2  . hydrocortisone (PROCTOZONE-HC) 2.5 % rectal cream Place rectally 2 (two) times daily. (Patient taking differently: Place 1 application rectally 2 (two) times daily as needed for hemorrhoids. ) 30 g 0  . lisinopril-hydrochlorothiazide (PRINZIDE,ZESTORETIC) 20-25 MG tablet Take 1 tablet by mouth daily. (Patient taking differently: Take 0.5 tablets by mouth daily. ) 30 tablet 5  . meclizine (ANTIVERT) 25 MG tablet Take 1 tablet (25 mg total) by mouth 2 (two) times daily as needed for dizziness (vertigo). 30 tablet 0  .  omeprazole (PRILOSEC) 40 MG capsule TAKE 1 CAPSULE BY MOUTH EVERY DAY 90 capsule 2  . PARoxetine (PAXIL) 20 MG tablet Take 1 tablet (20 mg total) by mouth daily. (Patient taking differently: Take 10 mg by mouth daily. ) 90 tablet 1   No current facility-administered medications for this visit.     ALLERGIES: Erythromycin; Fentanyl; Heparin; Hydrocodone-acetaminophen; Meperidine hcl; Penicillins; Procaine hcl; Promethazine hcl; and Temazepam  Family History  Problem Relation Age of Onset  . Sudden death Brother 53    Apparent DVT/PE. all males  . Heart attack Brother   . Sudden death Cousin 64    Paternal cousin -dvt/pe 7 all males  . Coronary artery disease Cousin 80    Paternal cousin, MI  . Varicose Veins Mother   . Colon cancer Neg Hx   . Rectal cancer Neg Hx   . Stomach cancer Neg Hx   .  Breast cancer Daughter 55  . Hypertension Father     Social History   Social History  . Marital Status: Divorced    Spouse Name: N/A  . Number of Children: N/A  . Years of Education: N/A   Occupational History  . Not on file.   Social History Main Topics  . Smoking status: Never Smoker   . Smokeless tobacco: Never Used  . Alcohol Use: No  . Drug Use: No  . Sexual Activity: No   Other Topics Concern  . Not on file   Social History Narrative   Divorced. Lives alone.  Two children. 2 granddaughters (43 and 70 years old in 2016)   Family of 7 including ex-husband      Shows houses (Artist- Radiographer, therapeutic and rental properties)- shows rentals      Hobbies: painting- enjoys classes, writing, photography, sewing    Review of Systems  Constitutional: Positive for malaise/fatigue.  HENT:       Cough Sinusitis   Eyes: Negative.   Respiratory: Negative.   Cardiovascular: Negative.   Gastrointestinal: Negative.   Genitourinary:       Unscheduled bleeding   Musculoskeletal: Negative.   Skin: Negative.   Neurological: Positive for dizziness.  Endo/Heme/Allergies: Negative.   Psychiatric/Behavioral: Negative.     PHYSICAL EXAMINATION:    BP 154/78 mmHg  Pulse 72  Temp(Src) 97.7 F (36.5 C)  Resp 15  Wt 179 lb (81.194 kg)    General appearance: alert, cooperative and appears stated age Heart: regular rate and rhythm, + systolic ejection murmur  Lungs: CTAB Abdomen: soft, non-tender; bowel sounds normal; no masses,  no organomegaly Lungs: CTAB Extremities: normal, atraumatic, no cyanosis Skin: normal color, texture and turgor, no rashes or lesions Lymph: normal cervical supraclavicular and inguinal nodes Neurologic: grossly normal Sinuses: not tender to palpation     ASSESSMENT Postmenopausal bleeding, endometrial polyp on endometrial biopsy. Discussed the options with the patient of doing a sonohysterogram or a hysteroscopy, possilble polypectomy,  and D&C. Discussed that sometimes the polyps are microscopic and she may or may not have a visible polyp at hysteroscopy. She would like to proceed with hysteroscopy. Recovering from a sinus infection, 1 more day of antibiotics. Lungs are CTAB, no fevers, clear mucous.     PLAN Plan: hysteroscopy, possible polypectomy, dilation and curettage. Reviewed risks, including: bleeding, infection, uterine perforation, fluid overload, need for further sugery Will proceed with surgery as long as Anesthesia agrees   An After Visit Summary was printed and given to the patient.  25 minutes face to face time  of which over 50% was spent in counseling.

## 2015-11-21 ENCOUNTER — Ambulatory Visit (HOSPITAL_COMMUNITY): Payer: PPO | Admitting: Anesthesiology

## 2015-11-21 ENCOUNTER — Ambulatory Visit (HOSPITAL_COMMUNITY)
Admission: RE | Admit: 2015-11-21 | Discharge: 2015-11-21 | Disposition: A | Discharge status: Home / Self Care | Payer: PPO | Source: Ambulatory Visit | Attending: Obstetrics and Gynecology | Admitting: Obstetrics and Gynecology

## 2015-11-21 ENCOUNTER — Encounter (HOSPITAL_COMMUNITY)
Admission: RE | Disposition: A | Discharge status: Home / Self Care | Payer: Self-pay | Source: Ambulatory Visit | Attending: Obstetrics and Gynecology

## 2015-11-21 ENCOUNTER — Encounter (HOSPITAL_COMMUNITY): Payer: Self-pay | Admitting: Emergency Medicine

## 2015-11-21 DIAGNOSIS — N950 Postmenopausal bleeding: Secondary | ICD-10-CM | POA: Diagnosis not present

## 2015-11-21 DIAGNOSIS — N840 Polyp of corpus uteri: Secondary | ICD-10-CM | POA: Diagnosis not present

## 2015-11-21 DIAGNOSIS — K219 Gastro-esophageal reflux disease without esophagitis: Secondary | ICD-10-CM | POA: Insufficient documentation

## 2015-11-21 DIAGNOSIS — J45909 Unspecified asthma, uncomplicated: Secondary | ICD-10-CM | POA: Insufficient documentation

## 2015-11-21 DIAGNOSIS — I10 Essential (primary) hypertension: Secondary | ICD-10-CM | POA: Insufficient documentation

## 2015-11-21 DIAGNOSIS — Z880 Allergy status to penicillin: Secondary | ICD-10-CM | POA: Insufficient documentation

## 2015-11-21 HISTORY — PX: DILATATION & CURETTAGE/HYSTEROSCOPY WITH MYOSURE: SHX6511

## 2015-11-21 HISTORY — DX: Nausea with vomiting, unspecified: R11.2

## 2015-11-21 HISTORY — DX: Other specified postprocedural states: Z98.890

## 2015-11-21 SURGERY — DILATATION & CURETTAGE/HYSTEROSCOPY WITH MYOSURE
Anesthesia: Choice

## 2015-11-21 MED ORDER — FENTANYL CITRATE (PF) 100 MCG/2ML IJ SOLN
INTRAMUSCULAR | Status: AC
  Filled 2015-11-21: qty 2

## 2015-11-21 MED ORDER — SODIUM CHLORIDE 0.9 % IR SOLN
Status: DC | PRN
  Administered 2015-11-21: 3000 mL

## 2015-11-21 MED ORDER — SUCCINYLCHOLINE CHLORIDE 20 MG/ML IJ SOLN
INTRAMUSCULAR | Status: DC | PRN
  Administered 2015-11-21: 140 mg via INTRAVENOUS

## 2015-11-21 MED ORDER — PROPOFOL 10 MG/ML IV BOLUS
INTRAVENOUS | Status: AC
  Filled 2015-11-21: qty 20

## 2015-11-21 MED ORDER — LACTATED RINGERS IV SOLN
INTRAVENOUS | Status: DC
  Administered 2015-11-21 (×2): via INTRAVENOUS

## 2015-11-21 MED ORDER — DIPHENHYDRAMINE HCL 50 MG/ML IJ SOLN
INTRAMUSCULAR | Status: AC
  Filled 2015-11-21: qty 1

## 2015-11-21 MED ORDER — ACETAMINOPHEN 160 MG/5ML PO SOLN
ORAL | Status: AC
  Administered 2015-11-21: 650 mg via ORAL
  Filled 2015-11-21: qty 20.3

## 2015-11-21 MED ORDER — GLYCOPYRROLATE 0.2 MG/ML IJ SOLN
INTRAMUSCULAR | Status: AC
  Filled 2015-11-21: qty 1

## 2015-11-21 MED ORDER — KETOROLAC TROMETHAMINE 30 MG/ML IJ SOLN
INTRAMUSCULAR | Status: DC | PRN
  Administered 2015-11-21: 15 mg via INTRAVENOUS

## 2015-11-21 MED ORDER — ACETAMINOPHEN 160 MG/5ML PO SOLN
650.0000 mg | Freq: Once | ORAL | Status: AC
  Administered 2015-11-21: 650 mg via ORAL

## 2015-11-21 MED ORDER — PROPOFOL 10 MG/ML IV BOLUS
INTRAVENOUS | Status: DC | PRN
  Administered 2015-11-21: 40 mg via INTRAVENOUS
  Administered 2015-11-21 (×2): 50 mg via INTRAVENOUS
  Administered 2015-11-21: 160 mg via INTRAVENOUS
  Administered 2015-11-21: 50 mg via INTRAVENOUS

## 2015-11-21 MED ORDER — LACTATED RINGERS IV SOLN
INTRAVENOUS | Status: DC
  Administered 2015-11-21: 07:00:00 via INTRAVENOUS

## 2015-11-21 MED ORDER — ONDANSETRON HCL 4 MG/2ML IJ SOLN
INTRAMUSCULAR | Status: AC
  Filled 2015-11-21: qty 2

## 2015-11-21 MED ORDER — EPHEDRINE SULFATE 50 MG/ML IJ SOLN
INTRAMUSCULAR | Status: DC | PRN
  Administered 2015-11-21 (×2): 5 mg via INTRAVENOUS

## 2015-11-21 MED ORDER — DEXAMETHASONE SODIUM PHOSPHATE 10 MG/ML IJ SOLN
INTRAMUSCULAR | Status: DC | PRN
  Administered 2015-11-21: 8 mg via INTRAVENOUS

## 2015-11-21 MED ORDER — LIDOCAINE HCL (CARDIAC) 20 MG/ML IV SOLN
INTRAVENOUS | Status: DC | PRN
  Administered 2015-11-21: 80 mg via INTRAVENOUS

## 2015-11-21 MED ORDER — ONDANSETRON HCL 4 MG/2ML IJ SOLN
INTRAMUSCULAR | Status: DC | PRN
  Administered 2015-11-21: 4 mg via INTRAVENOUS

## 2015-11-21 MED ORDER — ALBUTEROL SULFATE HFA 108 (90 BASE) MCG/ACT IN AERS
INHALATION_SPRAY | RESPIRATORY_TRACT | Status: DC | PRN
  Administered 2015-11-21: 8 via RESPIRATORY_TRACT

## 2015-11-21 MED ORDER — EPHEDRINE 5 MG/ML INJ
INTRAVENOUS | Status: AC
  Filled 2015-11-21: qty 10

## 2015-11-21 MED ORDER — KETOROLAC TROMETHAMINE 30 MG/ML IJ SOLN
INTRAMUSCULAR | Status: AC
  Filled 2015-11-21: qty 1

## 2015-11-21 MED ORDER — SUCCINYLCHOLINE CHLORIDE 20 MG/ML IJ SOLN
INTRAMUSCULAR | Status: AC
Start: 2015-11-21 — End: 2015-11-21
  Filled 2015-11-21: qty 1

## 2015-11-21 MED ORDER — DIPHENHYDRAMINE HCL 50 MG/ML IJ SOLN
INTRAMUSCULAR | Status: DC | PRN
Start: 2015-11-21 — End: 2015-11-21
  Administered 2015-11-21: 12.5 mg via INTRAVENOUS

## 2015-11-21 MED ORDER — DEXAMETHASONE SODIUM PHOSPHATE 10 MG/ML IJ SOLN
INTRAMUSCULAR | Status: AC
  Filled 2015-11-21: qty 1

## 2015-11-21 MED ORDER — LIDOCAINE HCL (CARDIAC) 20 MG/ML IV SOLN
INTRAVENOUS | Status: AC
  Filled 2015-11-21: qty 5

## 2015-11-21 MED ORDER — GLYCOPYRROLATE 0.2 MG/ML IJ SOLN
INTRAMUSCULAR | Status: DC | PRN
  Administered 2015-11-21: 0.2 mg via INTRAVENOUS

## 2015-11-21 SURGICAL SUPPLY — 20 items
CANISTER SUCT 3000ML (MISCELLANEOUS) ×3 IMPLANT
CATH ROBINSON RED A/P 16FR (CATHETERS) ×3 IMPLANT
CLOTH BEACON ORANGE TIMEOUT ST (SAFETY) ×3 IMPLANT
CONTAINER PREFILL 10% NBF 60ML (FORM) ×6 IMPLANT
DEVICE MYOSURE LITE (MISCELLANEOUS) ×2 IMPLANT
DEVICE MYOSURE REACH (MISCELLANEOUS) IMPLANT
FILTER ARTHROSCOPY CONVERTOR (FILTER) ×3 IMPLANT
GLOVE BIO SURGEON STRL SZ 6.5 (GLOVE) ×2 IMPLANT
GLOVE BIO SURGEONS STRL SZ 6.5 (GLOVE) ×1
GLOVE BIOGEL PI IND STRL 7.0 (GLOVE) ×2 IMPLANT
GLOVE BIOGEL PI INDICATOR 7.0 (GLOVE) ×4
GOWN STRL REUS W/TWL LRG LVL3 (GOWN DISPOSABLE) ×6 IMPLANT
PACK VAGINAL MINOR WOMEN LF (CUSTOM PROCEDURE TRAY) ×3 IMPLANT
PAD OB MATERNITY 4.3X12.25 (PERSONAL CARE ITEMS) ×3 IMPLANT
SEAL ROD LENS SCOPE MYOSURE (ABLATOR) ×3 IMPLANT
SYR 20CC LL (SYRINGE) IMPLANT
TOWEL OR 17X24 6PK STRL BLUE (TOWEL DISPOSABLE) ×6 IMPLANT
TUBING AQUILEX INFLOW (TUBING) ×3 IMPLANT
TUBING AQUILEX OUTFLOW (TUBING) ×3 IMPLANT
WATER STERILE IRR 1000ML POUR (IV SOLUTION) ×3 IMPLANT

## 2015-11-21 NOTE — H&P (View-Only) (Signed)
Patient ID: Tammie [name], female   DOB: 25-Oct-1934, 81 y.o.   MRN: [id number] GYNECOLOGY  VISIT   HPI: 81 y.o.   Divorced  Caucasian  female   [phone] with No LMP recorded. Patient is postmenopausal.   The patient has a h/o PMP bleeding. Endometrial biopsy returned with endometrial polyp (minimal sample). She is here to further discuss her options.  She is on a Z pack for a sinus infection, tomorrow is her last day. She has intermittent coughing fits. Doesn't feel great, feels run down, but improving. No fevers. She has a productive cough, clear.   GYNECOLOGIC HISTORY: No LMP recorded. Patient is postmenopausal. Contraception:postmenopase Menopausal hormone therapy: none        OB History    Gravida Para Term Preterm AB TAB SAB Ectopic Multiple Living   4 3 3  1  1   2          Patient Active Problem List   Diagnosis Date Noted  . Osteopenia 10/02/2015  . Systolic murmur [id number]  . Hypertension   . Depression   . History of colorectal cancer   . Allergic rhinitis   . Asthma   . GERD 06/22/2009    Past Medical History  Diagnosis Date  . Anxiety disorder   . Hemorrhoids   . GERD (gastroesophageal reflux disease)   . Hypertension   . Colorectal cancer ([place]) 1997    T3, N0  . Allergic rhinitis   . Asthma   . Allergy     seasonal  . Depression     pt states she isn't depressed anymore, continues paxil daily  . Cataract     left eye  . Endometriosis   . Fibroid   . Thyroid disease   . Urinary incontinence   . Abnormal uterine bleeding   . Anxiety     Past Surgical History  Procedure Laterality Date  . Colectomy  1997    SIGMOID COLECTOMY 1997  . Dilation and curettage of uterus      D & C  . Tonsillectomy    . Colonoscopy    . Polypectomy    . Colon surgery      Current Outpatient Prescriptions  Medication Sig Dispense Refill  . albuterol (PROVENTIL HFA;VENTOLIN HFA) 108 (90 Base) MCG/ACT inhaler Inhale 2 puffs into the lungs every 6 (six) hours as  needed for wheezing or shortness of breath. 1 Inhaler 0  . amLODipine (NORVASC) 5 MG tablet Take 1-2 tablets (5-10 mg total) by mouth daily. Depending on blood pressure 60 tablet 3  . azithromycin (ZITHROMAX) 250 MG tablet Take 2 tabs on day 1, then 1 tab daily until finished 6 tablet 0  . budesonide-formoterol (SYMBICORT) 160-4.5 MCG/ACT inhaler Inhale 2 puffs into the lungs 2 (two) times daily. 1 Inhaler 3  . fluticasone (FLONASE) 50 MCG/ACT nasal spray Place 2 sprays into both nostrils daily. (Patient taking differently: Place 1 spray into both nostrils 2 (two) times daily. ) 16 g 2  . hydrocortisone (PROCTOZONE-HC) 2.5 % rectal cream Place rectally 2 (two) times daily. (Patient taking differently: Place 1 application rectally 2 (two) times daily as needed for hemorrhoids. ) 30 g 0  . lisinopril-hydrochlorothiazide (PRINZIDE,ZESTORETIC) 20-25 MG tablet Take 1 tablet by mouth daily. (Patient taking differently: Take 0.5 tablets by mouth daily. ) 30 tablet 5  . meclizine (ANTIVERT) 25 MG tablet Take 1 tablet (25 mg total) by mouth 2 (two) times daily as needed for dizziness (vertigo). 30 tablet 0  .  omeprazole (PRILOSEC) 40 MG capsule TAKE 1 CAPSULE BY MOUTH EVERY DAY 90 capsule 2  . PARoxetine (PAXIL) 20 MG tablet Take 1 tablet (20 mg total) by mouth daily. (Patient taking differently: Take 10 mg by mouth daily. ) 90 tablet 1   No current facility-administered medications for this visit.     ALLERGIES: Erythromycin; Fentanyl; Heparin; Hydrocodone-acetaminophen; Meperidine hcl; Penicillins; Procaine hcl; Promethazine hcl; and Temazepam  Family History  Problem Relation Age of Onset  . Sudden death Brother 88    Apparent DVT/PE. all males  . Heart attack Brother   . Sudden death Cousin 66    Paternal cousin -dvt/pe 7 all males  . Coronary artery disease Cousin 9    Paternal cousin, MI  . Varicose Veins Mother   . Colon cancer Neg Hx   . Rectal cancer Neg Hx   . Stomach cancer Neg Hx   .  Breast cancer Daughter 29  . Hypertension Father     Social History   Social History  . Marital Status: Divorced    Spouse Name: N/A  . Number of Children: N/A  . Years of Education: N/A   Occupational History  . Not on file.   Social History Main Topics  . Smoking status: Never Smoker   . Smokeless tobacco: Never Used  . Alcohol Use: No  . Drug Use: No  . Sexual Activity: No   Other Topics Concern  . Not on file   Social History Narrative   Divorced. Lives alone.  Two children. 2 granddaughters (23 and 18 years old in 2016)   Family of 7 including ex-husband      Shows houses (Artist- Radiographer, therapeutic and rental properties)- shows rentals      Hobbies: painting- enjoys classes, writing, photography, sewing    Review of Systems  Constitutional: Positive for malaise/fatigue.  HENT:       Cough Sinusitis   Eyes: Negative.   Respiratory: Negative.   Cardiovascular: Negative.   Gastrointestinal: Negative.   Genitourinary:       Unscheduled bleeding   Musculoskeletal: Negative.   Skin: Negative.   Neurological: Positive for dizziness.  Endo/Heme/Allergies: Negative.   Psychiatric/Behavioral: Negative.     PHYSICAL EXAMINATION:    BP 154/78 mmHg  Pulse 72  Temp(Src) 97.7 F (36.5 C)  Resp 15  Wt 179 lb (81.194 kg)    General appearance: alert, cooperative and appears stated age Heart: regular rate and rhythm, + systolic ejection murmur  Lungs: CTAB Abdomen: soft, non-tender; bowel sounds normal; no masses,  no organomegaly Lungs: CTAB Extremities: normal, atraumatic, no cyanosis Skin: normal color, texture and turgor, no rashes or lesions Lymph: normal cervical supraclavicular and inguinal nodes Neurologic: grossly normal Sinuses: not tender to palpation     ASSESSMENT Postmenopausal bleeding, endometrial polyp on endometrial biopsy. Discussed the options with the patient of doing a sonohysterogram or a hysteroscopy, possilble polypectomy,  and D&C. Discussed that sometimes the polyps are microscopic and she may or may not have a visible polyp at hysteroscopy. She would like to proceed with hysteroscopy. Recovering from a sinus infection, 1 more day of antibiotics. Lungs are CTAB, no fevers, clear mucous.     PLAN Plan: hysteroscopy, possible polypectomy, dilation and curettage. Reviewed risks, including: bleeding, infection, uterine perforation, fluid overload, need for further sugery Will proceed with surgery as long as Anesthesia agrees   An After Visit Summary was printed and given to the patient.  25 minutes face to face time  of which over 50% was spent in counseling.

## 2015-11-21 NOTE — Transfer of Care (Signed)
Immediate Anesthesia Transfer of Care Note  Patient: Tammie [name]  Procedure(s) Performed: Procedure(s): DILATATION & CURETTAGE/HYSTEROSCOPY WITH MYOSURE (N/A)  Patient Location: PACU  Anesthesia Type:General  Level of Consciousness: awake, alert , oriented and patient cooperative  Airway & Oxygen Therapy: Patient Spontanous Breathing, Patient connected to nasal cannula oxygen and Patient connected to face mask oxygen  Post-op Assessment: Report given to RN and Post -op Vital signs reviewed and stable  Post vital signs: Reviewed and stable  Last Vitals:  Filed Vitals:   11/21/15 0611  BP: 172/60  Pulse: 51  Temp: 37.1 C  Resp: 18    Complications: No apparent anesthesia complications

## 2015-11-21 NOTE — Anesthesia Procedure Notes (Signed)
Procedure Name: Intubation Date/Time: 11/21/2015 7:47 AM Performed by: Raenette Rover Pre-anesthesia Checklist: Patient identified, Emergency Drugs available, Suction available and Patient being monitored Patient Re-evaluated:Patient Re-evaluated prior to inductionOxygen Delivery Method: Circle system utilized Preoxygenation: Pre-oxygenation with 100% oxygen Intubation Type: IV induction Ventilation: Mask ventilation without difficulty, Oral airway inserted - appropriate to patient size and Two handed mask ventilation required Laryngoscope Size: Glidescope and 3 Grade View: Grade I Tube type: Oral Tube size: 7.0 mm Number of attempts: 1 Airway Equipment and Method: Stylet Placement Confirmation: ETT inserted through vocal cords under direct vision,  positive ETCO2,  CO2 detector and breath sounds checked- equal and bilateral Secured at: 20 cm Tube secured with: Tape Dental Injury: Teeth and Oropharynx as per pre-operative assessment  Difficulty Due To: Difficulty was anticipated and Difficult Airway- due to limited oral opening Future Recommendations: Recommend- induction with short-acting agent, and alternative techniques readily available Comments: Attempted LMA 3 placement x2 by CRNA--unsuccessful.  Easy mask ventilation with OAW in place--2 person, 2 hand ventilation ocassionally required.  Decision made to get patient deeper with IV and inhalational volatile agents and look with Glidescope.  Grade 1 view with Glidescope by MDA.  Induction agents and short-term paralytic administered and MDA able to intubate using Glidescope.  No adverse events.

## 2015-11-21 NOTE — Anesthesia Postprocedure Evaluation (Signed)
Anesthesia Post Note  Patient: Tammie [name]  Procedure(s) Performed: Procedure(s) (LRB): DILATATION & CURETTAGE/HYSTEROSCOPY WITH MYOSURE (N/A)  Patient location during evaluation: PACU Anesthesia Type: General Level of consciousness: sedated Pain management: satisfactory to patient Vital Signs Assessment: post-procedure vital signs reviewed and stable Respiratory status: spontaneous breathing Cardiovascular status: stable Anesthetic complications: no    Last Vitals:  Filed Vitals:   11/21/15 0954 11/21/15 1000  BP:  139/59  Pulse: 55 56  Temp:    Resp: 17 15    Last Pain: There were no vitals filed for this visit.               Riccardo Dubin

## 2015-11-21 NOTE — Op Note (Signed)
Preoperative Diagnosis: Postmenopausal bleeding  Postoperative Diagnosis: Same, endometrial polyp  Procedure: Hysteroscopy, polypectomy, dilation and curettage  Surgeon: Dr Sumner Boast  Assistants: None  Anesthesia: GET  EBL: minimal  Fluids: 1,100 cc LR  Fluid deficit: 0  Urine output: not recorded, patient voided on the way to the OR  Indications for surgery: The patient is a 81 yo female, who presented with PMP bleeding. Work up included an endometrial biopsy that returned with benign endometrial polyp. The risks of the surgery were reviewed with the patient and the consent form was signed prior to her surgery.  Findings: EUA: normal sized uterus, no adnexal masses. Hysteroscopy: large endometrial polyp, attached at the anterior fundus and coming into the cervix.  Specimens: Endometrial polyp, endometrial curettings   Procedure: The patient was taken to the operating room with an IV in place. She was placed in the dorsal lithotomy position and anesthesia was administered. She was prepped and draped in the usual sterile fashion for a vaginal procedure. She voided on the way to the OR. A weighted speculum was placed in the vagina and a single tooth tenaculum was placed on the anterior lip of the cervix. The cervix was dilated to a #7 hagar dilator. The uterus was sounded to 8 cm. The myosure hysteroscope was inserted into the uterine cavity. With continuous infusion of normal saline, the uterine cavity was visualized with the above findings. The myosure light resectoscope was used to remove the endometrial polyp. The myosure was then removed. The cavity was then curetted with the small sharp curette. The cavity had the characteristically gritty texture at the end of the procedure. The curette and the single tooth tenaculum were removed. Oozing from the tenaculum site was stopped with pressure. The speculum was removed. The patients perineum was cleansed of betadine and she was taken out  of the dorsal lithotomy position.  Upon awakening the patient was extubated and the patient was transferred to the recovery room in stable and awake condition.  The sponge and instrument count were correct. There were no complications.

## 2015-11-21 NOTE — Interval H&P Note (Signed)
History and Physical Interval Note:  11/21/2015 7:12 AM  Tammie [name]  has presented today for surgery, with the diagnosis of PMB endometrial polyp  The various methods of treatment have been discussed with the patient and family. After consideration of risks, benefits and other options for treatment, the patient has consented to  Procedure(s): [place] (N/A) as a surgical intervention .  The patient's history has been reviewed, patient examined, no change in status, stable for surgery.  I have reviewed the patient's chart and labs.  Questions were answered to the patient's satisfaction.     Salvadore Dom

## 2015-11-21 NOTE — Discharge Instructions (Signed)

## 2015-11-21 NOTE — OR Nursing (Signed)
Pt right hand slightly pink, no soreness,no swelling ,no discomfort. Blood pressure cuff was on right hand. Instructed to call md if worsens .  Darin Engels rn

## 2015-11-22 ENCOUNTER — Encounter (HOSPITAL_COMMUNITY): Payer: Self-pay | Admitting: Obstetrics and Gynecology

## 2015-11-24 ENCOUNTER — Ambulatory Visit (INDEPENDENT_AMBULATORY_CARE_PROVIDER_SITE_OTHER): Payer: PPO | Admitting: Family Medicine

## 2015-11-24 ENCOUNTER — Encounter: Payer: Self-pay | Admitting: Family Medicine

## 2015-11-24 VITALS — BP 150/68 | HR 65 | Temp 98.0°F | Wt 178.0 lb

## 2015-11-24 DIAGNOSIS — I10 Essential (primary) hypertension: Secondary | ICD-10-CM | POA: Diagnosis not present

## 2015-11-24 NOTE — Patient Instructions (Signed)
Let's make sure to take the norvasc and half pill of the lisinopril-hctz each evening. Continue checking blood pressure 5 days a week. Return in 6 weeks and we will decide if we need to make further changes. Restart water aerobics.

## 2015-11-24 NOTE — Progress Notes (Signed)
[name], MD  Subjective:  Tammie [name] is a 81 y.o. year old very pleasant female patient who presents for/with See problem oriented charting ROS- no chest pain or shortness of breath. Does have some raspy voice after recent GYN procedure due to intubation. No fever/chills  Past Medical History-  Patient Active Problem List   Diagnosis Date Noted  . Hypertension     Priority: Medium  . Depression     Priority: Medium  . History of colorectal cancer     Priority: Medium  . Asthma     Priority: Medium  . Systolic murmur [id number]    Priority: Low  . Allergic rhinitis     Priority: Low  . GERD 06/22/2009    Priority: Low  . Osteopenia 10/02/2015    Medications- reviewed and updated Current Outpatient Prescriptions  Medication Sig Dispense Refill  . albuterol (PROVENTIL HFA;VENTOLIN HFA) 108 (90 Base) MCG/ACT inhaler Inhale 2 puffs into the lungs every 6 (six) hours as needed for wheezing or shortness of breath. 1 Inhaler 0  . amLODipine (NORVASC) 5 MG tablet Take 1-2 tablets (5-10 mg total) by mouth daily. Depending on blood pressure (Patient taking differently: Take 5-10 mg by mouth at bedtime. Depending on blood pressure) 60 tablet 3  . azithromycin (ZITHROMAX) 250 MG tablet Take 2 tabs on day 1, then 1 tab daily until finished 6 tablet 0  . budesonide-formoterol (SYMBICORT) 160-4.5 MCG/ACT inhaler Inhale 2 puffs into the lungs 2 (two) times daily. 1 Inhaler 3  . fluticasone (FLONASE) 50 MCG/ACT nasal spray Place 2 sprays into both nostrils daily. (Patient taking differently: Place 1 spray into both nostrils 2 (two) times daily. ) 16 g 2  . hydrocortisone (PROCTOZONE-HC) 2.5 % rectal cream Place rectally 2 (two) times daily. (Patient taking differently: Place 1 application rectally 2 (two) times daily as needed for hemorrhoids. ) 30 g 0  . lisinopril-hydrochlorothiazide (PRINZIDE,ZESTORETIC) 20-25 MG tablet Take 1 tablet by mouth daily. (Patient taking differently: Take  0.5 tablets by mouth at bedtime. ) 30 tablet 5  . omeprazole (PRILOSEC) 40 MG capsule TAKE 1 CAPSULE BY MOUTH EVERY DAY (Patient taking differently: TAKE 1 CAPSULE BY MOUTH EVERY NIGHT) 90 capsule 2  . PARoxetine (PAXIL) 20 MG tablet Take 1 tablet (20 mg total) by mouth daily. (Patient taking differently: Take 10 mg by mouth at bedtime. ) 90 tablet 1  . meclizine (ANTIVERT) 25 MG tablet Take 1 tablet (25 mg total) by mouth 2 (two) times daily as needed for dizziness (vertigo). (Patient not taking: Reported on 11/24/2015) 30 tablet 0   No current facility-administered medications for this visit.    Objective: BP 150/68 mmHg  Pulse 65  Temp(Src) 98 F (36.7 C)  Wt 178 lb (80.74 kg) Gen: NAD, resting comfortably CV: RRRsystolic ejection murmur noted Lungs: CTAB no crackles, wheeze, rhonchi Abdomen: soft/nontender/nondistended/normal bowel sounds. No rebound or guarding.  Ext: no edema Skin: warm, dry, no rash Neuro: grossly normal, moves all extremities  Assessment/Plan:  Hypertension S: poor control. Taking amlodipine 7.5mg  consistently but inconsistent use on the lisinopril-hctz 20-25 1/2 pill due to at times feeling sleepy BP Readings from Last 3 Encounters:  11/24/15 150/68  11/21/15 139/59  11/20/15 141/80  A/P:Continue current meds:  Encouraged regular use of both medications. Continue home monitoring- follow up 6 weeks. Restart water aerobics as well and cut salt/watch dietary intake  6 weeks with sooner  Return precautions advised.   The duration of face-to-face time during this visit  was 15 minutes. Greater than 50% of this time was spent in counseling, explanation of diagnosis, planning of further management, and/or coordination of care.

## 2015-11-24 NOTE — Assessment & Plan Note (Signed)
S: poor control. Taking amlodipine 7.5mg  consistently but inconsistent use on the lisinopril-hctz 20-25 1/2 pill due to at times feeling sleepy BP Readings from Last 3 Encounters:  11/24/15 150/68  11/21/15 139/59  11/20/15 141/80  A/P:Continue current meds:  Encouraged regular use of both medications. Continue home monitoring- follow up 6 weeks. Restart water aerobics as well and cut salt/watch dietary intake

## 2015-12-06 ENCOUNTER — Encounter: Payer: Self-pay | Admitting: Obstetrics and Gynecology

## 2015-12-06 ENCOUNTER — Ambulatory Visit (INDEPENDENT_AMBULATORY_CARE_PROVIDER_SITE_OTHER): Payer: PPO | Admitting: Obstetrics and Gynecology

## 2015-12-06 VITALS — BP 130/60 | HR 60 | Resp 15 | Wt 178.0 lb

## 2015-12-06 DIAGNOSIS — Z9889 Other specified postprocedural states: Secondary | ICD-10-CM

## 2015-12-06 NOTE — Progress Notes (Signed)
Patient ID: Tammie [name], female   DOB: [birth year], 81 y.o.   MRN: [id number] GYNECOLOGY  VISIT   HPI: 81 y.o.   Divorced  Caucasian  female   [phone] with No LMP recorded. Patient is postmenopausal.   here for 15 day post opt D&C hysteroscopy w/ myosure with resection of a large endometrial polyp. Pathology was benign. She has stopped spotting. Only had minimal cramping the day of the surgery.   GYNECOLOGIC HISTORY: No LMP recorded. Patient is postmenopausal. Contraception:postmenopause Menopausal hormone therapy: none        OB History    Gravida Para Term Preterm AB TAB SAB Ectopic Multiple Living   4 3 3  1  1   2          Patient Active Problem List   Diagnosis Date Noted  . Osteopenia 10/02/2015  . Systolic murmur [id number]  . Hypertension   . Depression   . History of colorectal cancer   . Allergic rhinitis   . Asthma   . GERD 06/22/2009    Past Medical History  Diagnosis Date  . Anxiety disorder   . Hemorrhoids   . GERD (gastroesophageal reflux disease)   . Hypertension   . Colorectal cancer ([place]) 1997    T3, N0  . Allergic rhinitis   . Asthma   . Allergy     seasonal  . Depression     pt states she isn't depressed anymore, continues paxil daily  . Cataract     left eye surgery to removed  . Endometriosis   . Fibroid   . Thyroid disease   . Urinary incontinence   . Abnormal uterine bleeding   . Anxiety     panic attacks  . Seasonal allergies   . Hyperlipidemia     diet controlled, no meds  . Cataract     right eye  . SVD (spontaneous vaginal delivery)     x 3  . PONV (postoperative nausea and vomiting)     Past Surgical History  Procedure Laterality Date  . Colectomy  1997    SIGMOID COLECTOMY 1997  . Dilation and curettage of uterus      D & C  . Tonsillectomy    . Colonoscopy    . Polypectomy    . Cataract surgery Left   . Colon surgery  1997  . Dilatation & curettage/hysteroscopy with myosure N/A 11/21/2015    Procedure:  DILATATION & CURETTAGE/HYSTEROSCOPY WITH MYOSURE;  Surgeon: Salvadore Dom, MD;  Location: [facility];  Service: Gynecology;  Laterality: N/A;    Current Outpatient Prescriptions  Medication Sig Dispense Refill  . albuterol (PROVENTIL HFA;VENTOLIN HFA) 108 (90 Base) MCG/ACT inhaler Inhale 2 puffs into the lungs every 6 (six) hours as needed for wheezing or shortness of breath. 1 Inhaler 0  . amLODipine (NORVASC) 5 MG tablet Take 1-2 tablets (5-10 mg total) by mouth daily. Depending on blood pressure (Patient taking differently: Take 5-10 mg by mouth at bedtime. Depending on blood pressure) 60 tablet 3  . budesonide-formoterol (SYMBICORT) 160-4.5 MCG/ACT inhaler Inhale 2 puffs into the lungs 2 (two) times daily. 1 Inhaler 3  . fluticasone (FLONASE) 50 MCG/ACT nasal spray Place 2 sprays into both nostrils daily. (Patient taking differently: Place 1 spray into both nostrils 2 (two) times daily. ) 16 g 2  . hydrocortisone (PROCTOZONE-HC) 2.5 % rectal cream Place rectally 2 (two) times daily. (Patient taking differently: Place 1 application rectally 2 (two) times daily as needed for  hemorrhoids. ) 30 g 0  . lisinopril-hydrochlorothiazide (PRINZIDE,ZESTORETIC) 20-25 MG tablet Take 1 tablet by mouth daily. (Patient taking differently: Take 0.5 tablets by mouth at bedtime. ) 30 tablet 5  . meclizine (ANTIVERT) 25 MG tablet Take 1 tablet (25 mg total) by mouth 2 (two) times daily as needed for dizziness (vertigo). 30 tablet 0  . omeprazole (PRILOSEC) 40 MG capsule TAKE 1 CAPSULE BY MOUTH EVERY DAY (Patient taking differently: TAKE 1 CAPSULE BY MOUTH EVERY NIGHT) 90 capsule 2  . PARoxetine (PAXIL) 20 MG tablet Take 1 tablet (20 mg total) by mouth daily. (Patient taking differently: Take 10 mg by mouth at bedtime. ) 90 tablet 1   No current facility-administered medications for this visit.     ALLERGIES: Erythromycin; Fentanyl; Heparin; Hydrocodone-acetaminophen; Meperidine hcl; Penicillins; Procaine hcl;  Promethazine hcl; and Temazepam  Family History  Problem Relation Age of Onset  . Sudden death Brother 25    Apparent DVT/PE. all males  . Heart attack Brother   . Sudden death Cousin 48    Paternal cousin -dvt/pe 7 all males  . Coronary artery disease Cousin 72    Paternal cousin, MI  . Varicose Veins Mother   . Colon cancer Neg Hx   . Rectal cancer Neg Hx   . Stomach cancer Neg Hx   . Breast cancer Daughter 52  . Hypertension Father     Social History   Social History  . Marital Status: Divorced    Spouse Name: N/A  . Number of Children: N/A  . Years of Education: N/A   Occupational History  . Not on file.   Social History Main Topics  . Smoking status: Never Smoker   . Smokeless tobacco: Never Used  . Alcohol Use: 0.0 oz/week    0 Standard drinks or equivalent per week     Comment: occasional wine  . Drug Use: No  . Sexual Activity: No   Other Topics Concern  . Not on file   Social History Narrative   Divorced. Lives alone.  Two children. 2 granddaughters (6 and 71 years old in 2016)   Family of 7 including ex-husband      Shows houses (Artist- Radiographer, therapeutic and rental properties)- shows rentals      Hobbies: painting- enjoys classes, writing, photography, sewing    Review of Systems  Constitutional: Negative.   HENT: Negative.   Eyes: Negative.   Respiratory: Negative.   Cardiovascular: Negative.   Gastrointestinal: Negative.   Genitourinary: Negative.   Musculoskeletal: Negative.   Skin: Negative.   Neurological: Negative.   Endo/Heme/Allergies: Negative.   Psychiatric/Behavioral: Negative.     PHYSICAL EXAMINATION:    BP 130/60 mmHg  Pulse 60  Resp 15  Wt 178 lb (80.74 kg)    General appearance: alert, cooperative and appears stated age Abdomen: soft, non-tender; bowel sounds normal; no masses,  no organomegaly    ASSESSMENT 2 weeks s/p hysteroscopy, polypectomy, D&C, benign pathology    PLAN F/U with any concerns, or  in 1 year for an annual exam Reviewed the pictures of the hysteroscopy with the patient Answered her questions about polyps and fibroids Discussed being sexually active again, no current plans   An After Visit Summary was printed and given to the patient.  15 minutes face to face time of which over 50% was spent in counseling.

## 2016-01-19 ENCOUNTER — Other Ambulatory Visit: Payer: Self-pay | Admitting: Family Medicine

## 2016-02-19 ENCOUNTER — Ambulatory Visit (INDEPENDENT_AMBULATORY_CARE_PROVIDER_SITE_OTHER): Payer: PPO | Admitting: Family Medicine

## 2016-02-19 ENCOUNTER — Encounter: Payer: Self-pay | Admitting: Family Medicine

## 2016-02-19 VITALS — BP 142/70 | HR 76 | Temp 98.3°F | Ht 63.0 in | Wt 176.5 lb

## 2016-02-19 DIAGNOSIS — J309 Allergic rhinitis, unspecified: Secondary | ICD-10-CM

## 2016-02-19 DIAGNOSIS — R030 Elevated blood-pressure reading, without diagnosis of hypertension: Secondary | ICD-10-CM

## 2016-02-19 DIAGNOSIS — J4541 Moderate persistent asthma with (acute) exacerbation: Secondary | ICD-10-CM

## 2016-02-19 DIAGNOSIS — J988 Other specified respiratory disorders: Secondary | ICD-10-CM

## 2016-02-19 DIAGNOSIS — IMO0001 Reserved for inherently not codable concepts without codable children: Secondary | ICD-10-CM

## 2016-02-19 MED ORDER — DOXYCYCLINE HYCLATE 100 MG PO CAPS: 100.0000 mg | ORAL_CAPSULE | Freq: Two times a day (BID) | ORAL | Status: DC

## 2016-02-19 MED ORDER — FLUTICASONE PROPIONATE HFA 44 MCG/ACT IN AERO: 2.0000 | INHALATION_SPRAY | Freq: Two times a day (BID) | RESPIRATORY_TRACT | Status: DC

## 2016-02-19 NOTE — Patient Instructions (Addendum)
flovent twice daily for 2 weeks.  Albuterol as needed if coughing a lot or tight or wheezing per instructions.  If not feeling better over the next few days please star the antibiotic. Stay out of the sun completely if on this medication.  Follow up if worsening or persists.

## 2016-02-19 NOTE — Progress Notes (Signed)
Pre visit review using our clinic review tool, if applicable. No additional management support is needed unless otherwise documented below in the visit note. 

## 2016-02-19 NOTE — Progress Notes (Signed)
HPI:  Acute visit for:  Sinus congestion: -hx allergies, asthma -started 1.5 weeks ago -symptoms: sinus congestion, sore throat, drainage, cough, coughing, feels a little wheezy, fatigue -granddaughter and family had symptoms too -seems to be getting worse -no fevers, sob, ear pain, NVD, sinus pain, tooth pain -meds: taking her flonase, not using albuterol or symbicort -no alb lately   ROS: See pertinent positives and negatives per HPI.  Past Medical History  Diagnosis Date  . Anxiety disorder   . Hemorrhoids   . GERD (gastroesophageal reflux disease)   . Hypertension   . Colorectal cancer ([place]) 1997    T3, N0  . Allergic rhinitis   . Asthma   . Allergy     seasonal  . Depression     pt states she isn't depressed anymore, continues paxil daily  . Cataract     left eye surgery to removed  . Endometriosis   . Fibroid   . Thyroid disease   . Urinary incontinence   . Abnormal uterine bleeding   . Anxiety     panic attacks  . Seasonal allergies   . Hyperlipidemia     diet controlled, no meds  . Cataract     right eye  . SVD (spontaneous vaginal delivery)     x 3  . PONV (postoperative nausea and vomiting)     Past Surgical History  Procedure Laterality Date  . Colectomy  1997    SIGMOID COLECTOMY 1997  . Dilation and curettage of uterus      D & C  . Tonsillectomy    . Colonoscopy    . Polypectomy    . Cataract surgery Left   . Colon surgery  1997  . Dilatation & curettage/hysteroscopy with myosure N/A 11/21/2015    Procedure: DILATATION & CURETTAGE/HYSTEROSCOPY WITH MYOSURE;  Surgeon: Salvadore Dom, MD;  Location: [facility];  Service: Gynecology;  Laterality: N/A;    Family History  Problem Relation Age of Onset  . Sudden death Brother 75    Apparent DVT/PE. all males  . Heart attack Brother   . Sudden death Cousin 46    Paternal cousin -dvt/pe 7 all males  . Coronary artery disease Cousin 44    Paternal cousin, MI  . Varicose Veins Mother   .  Colon cancer Neg Hx   . Rectal cancer Neg Hx   . Stomach cancer Neg Hx   . Breast cancer Daughter 48  . Hypertension Father     Social History   Social History  . Marital Status: Divorced    Spouse Name: N/A  . Number of Children: N/A  . Years of Education: N/A   Social History Main Topics  . Smoking status: Never Smoker   . Smokeless tobacco: Never Used  . Alcohol Use: 0.0 oz/week    0 Standard drinks or equivalent per week     Comment: occasional wine  . Drug Use: No  . Sexual Activity: No   Other Topics Concern  . None   Social History Narrative   Divorced. Lives alone.  Two children. 2 granddaughters (69 and 58 years old in 2016)   Family of 7 including ex-husband      Shows houses (Artist- Radiographer, therapeutic and rental properties)- shows rentals      Hobbies: painting- enjoys classes, writing, photography, sewing     Current outpatient prescriptions:  .  albuterol (PROVENTIL HFA;VENTOLIN HFA) 108 (90 Base) MCG/ACT inhaler, Inhale 2 puffs into the lungs  every 6 (six) hours as needed for wheezing or shortness of breath., Disp: 1 Inhaler, Rfl: 0 .  amLODipine (NORVASC) 5 MG tablet, Take 1-2 tablets (5-10 mg total) by mouth daily. Depending on blood pressure (Patient taking differently: Take 5-10 mg by mouth at bedtime. Depending on blood pressure), Disp: 60 tablet, Rfl: 3 .  budesonide-formoterol (SYMBICORT) 160-4.5 MCG/ACT inhaler, Inhale 2 puffs into the lungs 2 (two) times daily., Disp: 1 Inhaler, Rfl: 3 .  fluticasone (FLONASE) 50 MCG/ACT nasal spray, PLACE 2 SPRAYS IN EACH NOSTRIL EVERY DAY, Disp: 16 g, Rfl: 5 .  hydrocortisone (PROCTOZONE-HC) 2.5 % rectal cream, Place rectally 2 (two) times daily. (Patient taking differently: Place 1 application rectally 2 (two) times daily as needed for hemorrhoids. ), Disp: 30 g, Rfl: 0 .  lisinopril-hydrochlorothiazide (PRINZIDE,ZESTORETIC) 20-25 MG tablet, Take 1 tablet by mouth daily. (Patient taking differently: Take 0.5  tablets by mouth at bedtime. ), Disp: 30 tablet, Rfl: 5 .  meclizine (ANTIVERT) 25 MG tablet, Take 1 tablet (25 mg total) by mouth 2 (two) times daily as needed for dizziness (vertigo)., Disp: 30 tablet, Rfl: 0 .  omeprazole (PRILOSEC) 40 MG capsule, TAKE 1 CAPSULE BY MOUTH EVERY DAY (Patient taking differently: TAKE 1 CAPSULE BY MOUTH EVERY NIGHT), Disp: 90 capsule, Rfl: 2 .  PARoxetine (PAXIL) 20 MG tablet, Take 1 tablet (20 mg total) by mouth daily. (Patient taking differently: Take 10 mg by mouth at bedtime. ), Disp: 90 tablet, Rfl: 1 .  doxycycline (VIBRAMYCIN) 100 MG capsule, Take 1 capsule (100 mg total) by mouth 2 (two) times daily., Disp: 14 capsule, Rfl: 0 .  fluticasone (FLOVENT HFA) 44 MCG/ACT inhaler, Inhale 2 puffs into the lungs 2 (two) times daily., Disp: 1 Inhaler, Rfl: 0  EXAM:  Filed Vitals:   02/19/16 1537  BP: 142/70  Pulse: 76  Temp: 98.3 F (36.8 C)    Body mass index is 31.27 kg/(m^2).  GENERAL: vitals reviewed and listed above, alert, oriented, appears well hydrated and in no acute distress  HEENT: atraumatic, conjunttiva clear, no obvious abnormalities on inspection of external nose and ears  NECK: no obvious masses on inspection  LUNGS: clear to auscultation bilaterally, no wheezes, rales or rhonchi, good air movement  CV: HRRR, no peripheral edema  MS: moves all extremities without noticeable abnormality  PSYCH: pleasant and cooperative, no obvious depression or anxiety  ASSESSMENT AND PLAN:  Discussed the following assessment and plan:  Allergic rhinitis, unspecified allergic rhinitis type Asthma, moderate persistent, with acute exacerbation Respiratory infection -Think she has an acute viral upper respiratory infection on top of her underlying allergies and asthma, with a mild flare of her asthma -We talked about getting prednisone, but she wishes to avoid this as several of her friends have had bad side effects with prednisone -She takes  Symbicort sometimes she reports, but has not taken in some time -We opted to do a several week course of an inhaled steroid, along with as needed albuterol and her allergy regimen. If not improving, given the length of course and some thick mucous we would then start an antibiotic in case of a bacterial infection. -She knows to follow up if she is worsening or symptoms are not improving with treatment  Elevated blood pressure -Improved on recheck and she reports better than usual -She plans to continue her current treatment and follow-up with her PCP  -Patient advised to return or notify a doctor immediately if symptoms worsen or persist or new concerns arise.  Patient Instructions  flovent twice daily for 2 weeks.  Albuterol as needed if coughing a lot or tight or wheezing per instructions.  If not feeling better over the next few days please star the antibiotic. Stay out of the sun completely if on this medication.  Follow up if worsening or persists.     Tammie Benton R.

## 2016-02-27 ENCOUNTER — Ambulatory Visit (INDEPENDENT_AMBULATORY_CARE_PROVIDER_SITE_OTHER): Payer: PPO | Admitting: Family Medicine

## 2016-02-27 ENCOUNTER — Encounter: Payer: Self-pay | Admitting: Family Medicine

## 2016-02-27 VITALS — BP 136/64 | HR 60 | Temp 97.7°F | Ht 63.0 in | Wt 175.0 lb

## 2016-02-27 DIAGNOSIS — J4541 Moderate persistent asthma with (acute) exacerbation: Secondary | ICD-10-CM

## 2016-02-27 MED ORDER — PREDNISONE 20 MG PO TABS: ORAL_TABLET | ORAL | Status: DC

## 2016-02-27 MED ORDER — BUDESONIDE-FORMOTEROL FUMARATE 160-4.5 MCG/ACT IN AERO: 2.0000 | INHALATION_SPRAY | Freq: Two times a day (BID) | RESPIRATORY_TRACT | Status: DC

## 2016-02-27 NOTE — Progress Notes (Signed)
PCP: [name], MD  Subjective:  Tammie [name] is a 81 y.o. year old very pleasant female patient who presents with  See problem oriented charting ROS-denies fever, NVD, tooth pain. complains of Low energy. No SOB unless in coughing fit. Wheeze mainly with coughing fit.   Pertinent Past Medical History-  Patient Active Problem List   Diagnosis Date Noted  . Hypertension     Priority: Medium  . Depression     Priority: Medium  . History of colorectal cancer     Priority: Medium  . Asthma     Priority: Medium  . Systolic murmur [id number]    Priority: Low  . Allergic rhinitis     Priority: Low  . GERD 06/22/2009    Priority: Low  . Osteopenia 10/02/2015    Medications- reviewed  Current Outpatient Prescriptions  Medication Sig Dispense Refill  . albuterol (PROVENTIL HFA;VENTOLIN HFA) 108 (90 Base) MCG/ACT inhaler Inhale 2 puffs into the lungs every 6 (six) hours as needed for wheezing or shortness of breath. 1 Inhaler 0  . amLODipine (NORVASC) 5 MG tablet Take 1-2 tablets (5-10 mg total) by mouth daily. Depending on blood pressure (Patient taking differently: Take 5-10 mg by mouth at bedtime. Depending on blood pressure) 60 tablet 3  . budesonide-formoterol (SYMBICORT) 160-4.5 MCG/ACT inhaler Inhale 2 puffs into the lungs 2 (two) times daily. 1 Inhaler 3  . doxycycline (VIBRAMYCIN) 100 MG capsule Take 1 capsule (100 mg total) by mouth 2 (two) times daily. 14 capsule 0  . fluticasone (FLONASE) 50 MCG/ACT nasal spray PLACE 2 SPRAYS IN EACH NOSTRIL EVERY DAY 16 g 5  . fluticasone (FLOVENT HFA) 44 MCG/ACT inhaler Inhale 2 puffs into the lungs 2 (two) times daily. 1 Inhaler 0  . hydrocortisone (PROCTOZONE-HC) 2.5 % rectal cream Place rectally 2 (two) times daily. (Patient taking differently: Place 1 application rectally 2 (two) times daily as needed for hemorrhoids. ) 30 g 0  . lisinopril-hydrochlorothiazide (PRINZIDE,ZESTORETIC) 20-25 MG tablet Take 1 tablet by mouth daily.  (Patient taking differently: Take 0.5 tablets by mouth at bedtime. ) 30 tablet 5  . meclizine (ANTIVERT) 25 MG tablet Take 1 tablet (25 mg total) by mouth 2 (two) times daily as needed for dizziness (vertigo). 30 tablet 0  . omeprazole (PRILOSEC) 40 MG capsule TAKE 1 CAPSULE BY MOUTH EVERY DAY (Patient taking differently: TAKE 1 CAPSULE BY MOUTH EVERY NIGHT) 90 capsule 2  . PARoxetine (PAXIL) 20 MG tablet Take 1 tablet (20 mg total) by mouth daily. (Patient taking differently: Take 10 mg by mouth at bedtime. ) 90 tablet 1   No current facility-administered medications for this visit.    Objective: BP 136/64 mmHg  Pulse 60  Temp(Src) 97.7 F (36.5 C) (Oral)  Ht 5\' 3"  (1.6 m)  Wt 175 lb (79.379 kg)  BMI 31.01 kg/m2  SpO2 94% Gen: NAD, resting comfortably, well appearing HEENT: Turbinates erythematous with clear drainage, TM normal, pharynx mildly erythematous with no tonsilar exudate or edema, no sinus tenderness CV: RRR no murmurs rubs or gallops Lungs: CTAB no crackles, wheeze, rhonchi Abdomen: soft/nontender/nondistended/normal bowel sounds. No rebound or guarding.  Ext: no edema Skin: warm, dry, no rash Neuro: grossly normal, moves all extremities  Assessment/Plan:  Cough/congestion/wheeze S:started: over 2.5 weeks ago after sitting outside at a concert. Started mainly with runny nose. Tried flonase to start but then later asthma seemed to worsen. Also associated with nasal congestion, mild sore throat, some wheeze, worsening cough. Seen in our office  and started on flovent after reporting not taking symbicort in some time (story reported differently today stating she had been taking symbicort regularly, but nto albuterol). She did not pick up albuterol inhaler for some reason either. She was given doxycycline for concern sinusitis in PCN allergic patient. 2 days after visit worsening sinus congestion and  turned green- took medicine but has been nauseated on it. Able to cough up  green/yellow sputum and blow out green/yellow sputum. Congestion is now clear after antibiotics. She is having prolonged coughing fits associated with wheeze especially at night A/P: Bacterial sinusitis appears to have improved as less sinus congestion and now clear but does persist some- may still have allergic element. May continue flonase. Continues to have coughing fits and wheeze- appears to be bothering asthma more/asthma exacerbation. Restart symbicort, use albuterol for symptoms (has not picked up), start low dose prednisoen as she is concerned about side effects.   Finally, we reviewed reasons to return to care including if symptoms worsen or persist or new concerns arise.  Meds ordered this encounter  Medications  . predniSONE (DELTASONE) 20 MG tablet    Sig: Take 1 pill a day for 7 days    Dispense:  7 tablet    Refill:  0  . budesonide-formoterol (SYMBICORT) 160-4.5 MCG/ACT inhaler    Sig: Inhale 2 puffs into the lungs 2 (two) times daily.    Dispense:  1 Inhaler    Refill:  3    [name], MD

## 2016-02-27 NOTE — Patient Instructions (Signed)
Take prednisone for 7 days  Restart symbicort  Pick up albuterol and use as needed for coughing fits  Update me in 7 days  If green nasal discharge comes back or you have fever or worsening shortness of breath please let me know

## 2016-02-27 NOTE — Progress Notes (Signed)
Pre visit review using our clinic review tool, if applicable. No additional management support is needed unless otherwise documented below in the visit note. 

## 2016-03-07 ENCOUNTER — Telehealth: Payer: Self-pay | Admitting: Family Medicine

## 2016-03-07 NOTE — Telephone Encounter (Signed)
Called patient and left a voicemail message. She does not have to come back in for a follow up unless she starts feeling worse per Dr. Yong Channel.

## 2016-03-07 NOTE — Telephone Encounter (Signed)
Pt was to update you in 7 days.  Pt states she is doing beautifully. Pt still has a little drainage, nothing green. Pt would like to know if you need to see her? If so, is the week after July 4th is ok?  Do you need 30 min?

## 2016-03-12 ENCOUNTER — Ambulatory Visit: Payer: PPO | Admitting: Family Medicine

## 2016-03-18 ENCOUNTER — Encounter: Payer: Self-pay | Admitting: Family Medicine

## 2016-03-18 ENCOUNTER — Ambulatory Visit (INDEPENDENT_AMBULATORY_CARE_PROVIDER_SITE_OTHER): Payer: PPO | Admitting: Family Medicine

## 2016-03-18 VITALS — BP 180/72 | HR 66 | Temp 97.7°F | Ht 63.0 in | Wt 178.0 lb

## 2016-03-18 DIAGNOSIS — J320 Chronic maxillary sinusitis: Secondary | ICD-10-CM | POA: Diagnosis not present

## 2016-03-18 DIAGNOSIS — I10 Essential (primary) hypertension: Secondary | ICD-10-CM

## 2016-03-18 MED ORDER — AZITHROMYCIN 250 MG PO TABS: ORAL_TABLET | ORAL | Status: DC

## 2016-03-18 NOTE — Progress Notes (Signed)
Pre visit review using our clinic review tool, if applicable. No additional management support is needed unless otherwise documented below in the visit note. 

## 2016-03-18 NOTE — Progress Notes (Signed)
Subjective:  Tammie [name] is a 81 y.o. year old very pleasant female patient who presents for/with See problem oriented charting ROS- no fever, chills, nausea, vomiting, headache. Does have some ear presure.see any ROS included in HPI as well.   Past Medical History-  Patient Active Problem List   Diagnosis Date Noted  . Hypertension     Priority: Medium  . Depression     Priority: Medium  . History of colorectal cancer     Priority: Medium  . Asthma     Priority: Medium  . Systolic murmur [id number]    Priority: Low  . Allergic rhinitis     Priority: Low  . GERD 06/22/2009    Priority: Low  . Osteopenia 10/02/2015    Medications- reviewed and updated Current Outpatient Prescriptions  Medication Sig Dispense Refill  . albuterol (PROVENTIL HFA;VENTOLIN HFA) 108 (90 Base) MCG/ACT inhaler Inhale 2 puffs into the lungs every 6 (six) hours as needed for wheezing or shortness of breath. 1 Inhaler 0  . amLODipine (NORVASC) 5 MG tablet Take 1-2 tablets (5-10 mg total) by mouth daily. Depending on blood pressure (Patient taking differently: Take 5-10 mg by mouth at bedtime. Depending on blood pressure) 60 tablet 3  . budesonide-formoterol (SYMBICORT) 160-4.5 MCG/ACT inhaler Inhale 2 puffs into the lungs 2 (two) times daily. 1 Inhaler 3  . fluticasone (FLONASE) 50 MCG/ACT nasal spray PLACE 2 SPRAYS IN EACH NOSTRIL EVERY DAY 16 g 5  . hydrocortisone (PROCTOZONE-HC) 2.5 % rectal cream Place rectally 2 (two) times daily. (Patient taking differently: Place 1 application rectally 2 (two) times daily as needed for hemorrhoids. ) 30 g 0  . lisinopril-hydrochlorothiazide (PRINZIDE,ZESTORETIC) 20-25 MG tablet Take 1 tablet by mouth daily. (Patient taking differently: Take 0.5 tablets by mouth at bedtime. ) 30 tablet 5  . meclizine (ANTIVERT) 25 MG tablet Take 1 tablet (25 mg total) by mouth 2 (two) times daily as needed for dizziness (vertigo). 30 tablet 0  . omeprazole (PRILOSEC) 40 MG capsule  TAKE 1 CAPSULE BY MOUTH EVERY DAY (Patient taking differently: TAKE 1 CAPSULE BY MOUTH EVERY NIGHT) 90 capsule 2  . PARoxetine (PAXIL) 20 MG tablet Take 1 tablet (20 mg total) by mouth daily. (Patient taking differently: Take 10 mg by mouth at bedtime. ) 90 tablet 1   No current facility-administered medications for this visit.    Objective: BP 180/72 mmHg  Pulse 66  Temp(Src) 97.7 F (36.5 C) (Oral)  Ht 5\' 3"  (1.6 m)  Wt 178 lb (80.74 kg)  BMI 31.54 kg/m2  SpO2 98% Gen: NAD, resting comfortably TM normal bilaterally- slight bulge on left. Oropharynx normal. Clear drainage in nares. Tenderness noted on left maxillayr and frontal sinus CV: RRR no murmurs rubs or gallops Lungs: CTAB no crackles, wheeze, rhonchi Abdomen: soft/nontender/nondistended/normal bowel sounds. No rebound or guarding.  Ext: no edema Skin: warm, dry Neuro: grossly normal, moves all extremities, PERRLA  Assessment/Plan:  Hypertension S: very poor control today on just 5mg  amlodipine and half tablet of lisinopril 20-25mg  daily. She tends to change her BP meds at times without advise from physician which I advised against.  BP Readings from Last 3 Encounters:  03/18/16 180/72  02/27/16 136/64  02/19/16 142/70  A/P: She decided to take an extra amlodipine 5mg  just before I cam ein. In addition, we agreed to full tablet of lisinopril-hctz tonight. Follow up Wednesday AM for this and sinus issues  Sinusitis maxillary left S: Illnes dates back to May. Seen by  Dr. Maudie Mercury 02/19/16 and treated for allergic rhinitis, mild flare of asthma, URI with doxycycline to be used if not better in a few days, started on flovent (patient had stated was not taking symbicor tregularly which she later stated she mispoke on). She declined prednisone at that time. Patient saw me 8 days later and asthma seemed more active with more wheeze- so we added symbicort back in and stopped flovent as well as did prednisone course. 9 days later she  stated she was doing beautifully with mainly only clear drainage through phone message. She states since that time her congestion has thickened and started with constant drainage, sinus pressure has steadily increased. She also has some left ear pressure.  A/P: I still think some of her symptoms may have allergic baseline- she is continuing flonase. Advised her to add zyrtec or allegra but states she feels "crazy" on those- she is going to see if she can find clarinex. Singulair is an option as well. Given her worsening sinus pressure- appears to have sinusitis- will cover with azithromycin- I had advised levaquin but she was concerned of SE. If this fails from sinus pressure perspective would do levquin 10-14 days and if that fails send to ENT. She has had rash on erythromycin in past but done wonderfully with azithromycin. Left ear pressure seems to be transmitted from sinuses- no obvious infection.   Meds ordered this encounter  Medications  . azithromycin (ZITHROMAX) 250 MG tablet    Sig: Take 2 tabs on day 1, then 1 tab daily until finished    Dispense:  6 tablet    Refill:  0  2 worsening established issues. Blood pressure high risk. Med management.    Return precautions advised.  Very concerned about BP- she has strict ED precautions. She is not using decongestants. Will avoid prednisone unless BP more reasonable.  [name], MD

## 2016-03-18 NOTE — Patient Instructions (Addendum)
Take full lisinopril-hctz tonight  Glad you took extra amlodipine- watch blood pressure and if over 150 - take 2 tomorrow  Start azithromycin   If symptoms worsen or new symptoms- seek care immediately  11 30 Wednesday morning- schedule visit at check out

## 2016-03-19 NOTE — Assessment & Plan Note (Signed)
S: very poor control today on just 5mg  amlodipine and half tablet of lisinopril 20-25mg  daily. She tends to change her BP meds at times without advise from physician which I advised against.  BP Readings from Last 3 Encounters:  03/18/16 180/72  02/27/16 136/64  02/19/16 142/70  A/P: She decided to take an extra amlodipine 5mg  just before I cam ein. In addition, we agreed to full tablet of lisinopril-hctz tonight. Follow up Wednesday AM for this and sinus issues

## 2016-03-20 ENCOUNTER — Encounter: Payer: Self-pay | Admitting: Family Medicine

## 2016-03-20 ENCOUNTER — Ambulatory Visit (INDEPENDENT_AMBULATORY_CARE_PROVIDER_SITE_OTHER): Payer: PPO | Admitting: Family Medicine

## 2016-03-20 VITALS — BP 144/68 | HR 57 | Temp 97.6°F | Ht 63.0 in | Wt 176.0 lb

## 2016-03-20 DIAGNOSIS — I10 Essential (primary) hypertension: Secondary | ICD-10-CM

## 2016-03-20 DIAGNOSIS — J320 Chronic maxillary sinusitis: Secondary | ICD-10-CM

## 2016-03-20 NOTE — Progress Notes (Signed)
Pre visit review using our clinic review tool, if applicable. No additional management support is needed unless otherwise documented below in the visit note. 

## 2016-03-20 NOTE — Patient Instructions (Signed)
Glad you are doing so much better  Blood pressure MUCH better- may continue to get better as this works its way all the way out of your system. Continue full pill of both medicines for blood pressure. Call me if getting above 150, prefer under 140 but have been there in past so we will monitor  Come see me if things go the opposite direction

## 2016-03-20 NOTE — Progress Notes (Signed)
Subjective:  Tammie [name] is a 81 y.o. year old very pleasant female patient who presents for/with See problem oriented charting ROS- no fever, chills, nausea, vomiting. No chest pain or shortness of breath. Ear pressure has resolved.see any ROS included in HPI as well.   Past Medical History-  Patient Active Problem List   Diagnosis Date Noted  . Hypertension     Priority: Medium  . Depression     Priority: Medium  . History of colorectal cancer     Priority: Medium  . Asthma     Priority: Medium  . Systolic murmur [id number]    Priority: Low  . Allergic rhinitis     Priority: Low  . GERD 06/22/2009    Priority: Low  . Osteopenia 10/02/2015    Medications- reviewed and updated Current Outpatient Prescriptions  Medication Sig Dispense Refill  . albuterol (PROVENTIL HFA;VENTOLIN HFA) 108 (90 Base) MCG/ACT inhaler Inhale 2 puffs into the lungs every 6 (six) hours as needed for wheezing or shortness of breath. 1 Inhaler 0  . amLODipine (NORVASC) 5 MG tablet Take 1-2 tablets (5-10 mg total) by mouth daily. Depending on blood pressure (Patient taking differently: Take 5-10 mg by mouth at bedtime. Depending on blood pressure) 60 tablet 3  . azithromycin (ZITHROMAX) 250 MG tablet Take 2 tabs on day 1, then 1 tab daily until finished 6 tablet 0  . budesonide-formoterol (SYMBICORT) 160-4.5 MCG/ACT inhaler Inhale 2 puffs into the lungs 2 (two) times daily. 1 Inhaler 3  . fluticasone (FLONASE) 50 MCG/ACT nasal spray PLACE 2 SPRAYS IN EACH NOSTRIL EVERY DAY 16 g 5  . hydrocortisone (PROCTOZONE-HC) 2.5 % rectal cream Place rectally 2 (two) times daily. (Patient taking differently: Place 1 application rectally 2 (two) times daily as needed for hemorrhoids. ) 30 g 0  . lisinopril-hydrochlorothiazide (PRINZIDE,ZESTORETIC) 20-25 MG tablet Take 1 tablet by mouth daily. (Patient taking differently: Take 0.5 tablets by mouth at bedtime. ) 30 tablet 5  . meclizine (ANTIVERT) 25 MG tablet Take 1  tablet (25 mg total) by mouth 2 (two) times daily as needed for dizziness (vertigo). 30 tablet 0  . omeprazole (PRILOSEC) 40 MG capsule TAKE 1 CAPSULE BY MOUTH EVERY DAY (Patient taking differently: TAKE 1 CAPSULE BY MOUTH EVERY NIGHT) 90 capsule 2  . PARoxetine (PAXIL) 20 MG tablet Take 1 tablet (20 mg total) by mouth daily. (Patient taking differently: Take 10 mg by mouth at bedtime. ) 90 tablet 1   Objective: BP 144/68 mmHg  Pulse 57  Temp(Src) 97.6 F (36.4 C) (Oral)  Ht 5\' 3"  (1.6 m)  Wt 176 lb (79.833 kg)  BMI 31.18 kg/m2  SpO2 96% Gen: NAD, resting comfortably TM normal bilaterally. Oropharynx normal. Less Clear drainage in nares. Very mild, much improved Tenderness noted on left maxillayr and frontal sinus CV: RRR no murmurs rubs or gallops Lungs: CTAB no crackles, wheeze, rhonchi Abdomen: soft/nontender/nondistended/normal bowel sounds. No rebound or guarding.  Ext: no edema Skin: warm, dry Neuro: grossly normal, moves all extremities, PERRLA  Assessment/Plan:  Sinusitis maxillary left Hypertension S: started on azithromycin 2 days ago after a month worth of illness. Much improved within 24 hours per patient. Her left sided frontal and maxillary pain much better and left ear ache has resolved. Fatigue has improved. No wheezing or flare of asthma apparent.   We also had close follow up due to  BP Readings from Last 3 Encounters:  03/20/16 144/68  03/18/16 180/72  02/27/16 136/64   BP  is MUCH improved on full pill of lisinopril-hctz 20-25 and amlodipine 5mg - she has tolerated increased dose though had not previously.   A/P: Hypertension much improved still mild elevation- advised to monitor as has been controlled in past on same regimen- STRONGLy encouraged regular compliance and to call me with any changes. Advised monitoring at home and if over 150 return to care. She is aware I would prefer under 140 but she has very mild orthostatic symptoms that I do not want to worsen  at present  Sinusitis much improved on azithromycin. If recurrent symptoms advised follow up in offic.e   Return precautions advised.   [name], MD

## 2016-04-11 ENCOUNTER — Other Ambulatory Visit: Payer: Self-pay

## 2016-04-11 ENCOUNTER — Telehealth: Payer: Self-pay | Admitting: Family Medicine

## 2016-04-11 DIAGNOSIS — F411 Generalized anxiety disorder: Secondary | ICD-10-CM

## 2016-04-11 MED ORDER — PAROXETINE HCL 20 MG PO TABS: 20.0000 mg | ORAL_TABLET | Freq: Every day | ORAL | 5 refills | Status: DC

## 2016-04-11 NOTE — Telephone Encounter (Signed)
Spoke with patient and prescription sent to [facility]

## 2016-04-11 NOTE — Telephone Encounter (Addendum)
Pt request refill  PARoxetine (PAXIL) 20 MG tablet  [facility] because this is a new rx for them.

## 2016-05-17 ENCOUNTER — Other Ambulatory Visit: Payer: Self-pay | Admitting: Family Medicine

## 2016-05-23 DIAGNOSIS — H2513 Age-related nuclear cataract, bilateral: Secondary | ICD-10-CM | POA: Diagnosis not present

## 2016-06-10 ENCOUNTER — Other Ambulatory Visit: Payer: Self-pay

## 2016-06-10 ENCOUNTER — Telehealth: Payer: Self-pay | Admitting: Family Medicine

## 2016-06-10 MED ORDER — DESLORATADINE 5 MG PO TABS: 5.0000 mg | ORAL_TABLET | Freq: Every day | ORAL | 5 refills | Status: DC

## 2016-06-10 NOTE — Telephone Encounter (Signed)
Yes thanks 5 mg clarinex 30 pills 5 refills is fine

## 2016-06-10 NOTE — Telephone Encounter (Signed)
Patient used to take Clarinex years back.  Patient states she is having a lot of problems with her allergies.  She states there is a generic called Desloratadine for Clarinex that she wants to get a prescription for her allergies.  Patient wishes for a call back.

## 2016-06-10 NOTE — Telephone Encounter (Signed)
Prescription sent in to the pharmacy. Left voicemail message for patient to notify her.

## 2016-07-03 ENCOUNTER — Other Ambulatory Visit: Payer: Self-pay | Admitting: Family Medicine

## 2016-07-03 DIAGNOSIS — J4541 Moderate persistent asthma with (acute) exacerbation: Secondary | ICD-10-CM

## 2016-07-18 ENCOUNTER — Other Ambulatory Visit: Payer: Self-pay | Admitting: Family Medicine

## 2016-07-23 ENCOUNTER — Ambulatory Visit (INDEPENDENT_AMBULATORY_CARE_PROVIDER_SITE_OTHER): Payer: PPO | Admitting: Family Medicine

## 2016-07-23 ENCOUNTER — Telehealth: Payer: Self-pay

## 2016-07-23 ENCOUNTER — Encounter: Payer: Self-pay | Admitting: Family Medicine

## 2016-07-23 VITALS — BP 148/72 | HR 66 | Temp 98.4°F | Wt 178.8 lb

## 2016-07-23 DIAGNOSIS — I10 Essential (primary) hypertension: Secondary | ICD-10-CM | POA: Diagnosis not present

## 2016-07-23 DIAGNOSIS — M79605 Pain in left leg: Secondary | ICD-10-CM | POA: Diagnosis not present

## 2016-07-23 NOTE — Progress Notes (Signed)
Subjective:  Tammie [name] is a 81 y.o. year old very pleasant female patient who presents for/with See problem oriented charting ROS- No chest pain or shortness of breath. No headache or blurry vision. No worsening focal swelling in area noted below.see any ROS included in HPI as well.   Past Medical History-  Patient Active Problem List   Diagnosis Date Noted  . Hypertension     Priority: Medium  . Depression     Priority: Medium  . History of colorectal cancer     Priority: Medium  . Asthma     Priority: Medium  . Systolic murmur [id number]    Priority: Low  . Allergic rhinitis     Priority: Low  . GERD 06/22/2009    Priority: Low  . Osteopenia 10/02/2015    Medications- reviewed and updated Current Outpatient Prescriptions  Medication Sig Dispense Refill  . albuterol (PROVENTIL HFA;VENTOLIN HFA) 108 (90 Base) MCG/ACT inhaler Inhale 2 puffs into the lungs every 6 (six) hours as needed for wheezing or shortness of breath. 1 Inhaler 0  . amLODipine (NORVASC) 5 MG tablet TAKE 1 to 2 TABLETS BY MOUTH EVERY DAY depending ON blood pressure 60 tablet 5  . desloratadine (CLARINEX) 5 MG tablet Take 1 tablet (5 mg total) by mouth daily. 30 tablet 5  . fluticasone (FLONASE) 50 MCG/ACT nasal spray PLACE 2 SPRAYS IN EACH NOSTRIL EVERY DAY 16 g 5  . hydrocortisone (PROCTOZONE-HC) 2.5 % rectal cream Place rectally 2 (two) times daily. (Patient taking differently: Place 1 application rectally 2 (two) times daily as needed for hemorrhoids. ) 30 g 0  . lisinopril-hydrochlorothiazide (PRINZIDE,ZESTORETIC) 20-25 MG tablet TAKE 1 TABLET BY MOUTH EVERY DAY 30 tablet 5  . meclizine (ANTIVERT) 25 MG tablet Take 1 tablet (25 mg total) by mouth 2 (two) times daily as needed for dizziness (vertigo). 30 tablet 0  . omeprazole (PRILOSEC) 40 MG capsule TAKE 1 CAPSULE BY MOUTH EVERY DAY (Patient taking differently: TAKE 1 CAPSULE BY MOUTH EVERY NIGHT) 90 capsule 2  . PARoxetine (PAXIL) 20 MG tablet Take 1  tablet (20 mg total) by mouth daily. 30 tablet 5  . SYMBICORT 160-4.5 MCG/ACT inhaler INHALE 2 PUFFS into THE lungs 2 TIMES DAILY 10.2 g 1   No current facility-administered medications for this visit.     Objective: BP (!) 148/72   Pulse 66   Temp 98.4 F (36.9 C) (Oral)   Wt 178 lb 12.8 oz (81.1 kg)   SpO2 95%   BMI 31.67 kg/m  Gen: NAD, resting comfortably CV: RRR 2/6 SEM. no rubs or gallops Lungs: CTAB no crackles, wheeze, rhonchi  Ext: no edema pretibial, no swelling 10 cm below tibial plateau and equal calf size. On left side above medial ankle there is an area likely 6x6 cm of raised, slightly warm, slightly tender raised tissue Skin: warm, dry, very mild erythema in area noted above  Assessment/Plan:  Left leg pain S: leg swelling for one week. Around medial portion of left ankle. Denies injury. Mild aching pain and worse with palpation. No treatments yet tried.  A/P: Likely superficial clot but want to determine if any communication with DVT or potential to develop into that. She has multiple female family members die or have DVT/PE though no females have had issue- increases risk for patient with her family history. Will give #7 xarelto 15mg  for BID dosing while we obtain venous duplex. No SOB or decreased oxygen saturation to suggest PE.   Hypertension  S: reasonably controlled on amlodipine 10mg , lisinopril-hctz 20-25mg  per JNC8 BP Readings from Last 3 Encounters:  07/23/16 (!) 148/72  03/20/16 (!) 144/68  03/18/16 (!) 180/72  A/P:Continue current meds:  Initially was elevated but came down on repeat. Would prefer <140 if able  Return precautions advised.  [name], MD

## 2016-07-23 NOTE — Assessment & Plan Note (Signed)
S: reasonably controlled on amlodipine 10mg , lisinopril-hctz 20-25mg  per JNC8 BP Readings from Last 3 Encounters:  07/23/16 (!) 148/72  03/20/16 (!) 144/68  03/18/16 (!) 180/72  A/P:Continue current meds:  Initially was elevated but came down on repeat. Would prefer <140 if able

## 2016-07-23 NOTE — Patient Instructions (Addendum)
____________________________________________________________ Verbal order received for patient to receive sample medication. Patient teaching completed by provider.   Medication (including dosage):Xarelto 15mg   Lot #: [id number] Expiration: 04-19  Qty: 7 tab ____________________________________________________________  Take xarelto first dose tonight with food then twice a day tomorrow with food. Continue until you run out- this should give [place] enough time to get your report back. If you are taking aspirin- hold while taking xarelto  We will call you within a week about your referral for ultrasound of the left leg. If you do not hear within 2 weeks, give [place] a call.

## 2016-07-23 NOTE — Progress Notes (Signed)
Pre visit review using our clinic review tool, if applicable. No additional management support is needed unless otherwise documented below in the visit note. 

## 2016-07-24 ENCOUNTER — Telehealth: Payer: Self-pay | Admitting: Family Medicine

## 2016-07-24 ENCOUNTER — Inpatient Hospital Stay (HOSPITAL_COMMUNITY): Admission: RE | Admit: 2016-07-24 | Payer: PPO | Source: Ambulatory Visit

## 2016-07-24 NOTE — Telephone Encounter (Signed)
Please let patient know more likely to get results tomorrow- she should continue the xarelto- has enough through Friday evening

## 2016-07-24 NOTE — Telephone Encounter (Signed)
I called patient to inform of scheduled appt today 07-24-2016@4 :30 for LE VENOUS (DVT  .[facility] [facility]  [address]  [place]  [place], [place] [address]  Main: [phone]    Pt would like to know will  Dr. Yong Channel receive  Her results same day . Please advise

## 2016-07-24 NOTE — Telephone Encounter (Signed)
Spoke with patient who verbalized understanding.

## 2016-07-25 ENCOUNTER — Encounter (HOSPITAL_COMMUNITY): Payer: Self-pay

## 2016-07-25 ENCOUNTER — Ambulatory Visit (HOSPITAL_COMMUNITY)
Admission: RE | Admit: 2016-07-25 | Discharge: 2016-07-25 | Disposition: A | Discharge status: Home / Self Care | Payer: PPO | Source: Ambulatory Visit | Attending: Cardiology | Admitting: Cardiology

## 2016-07-25 DIAGNOSIS — M79605 Pain in left leg: Secondary | ICD-10-CM | POA: Diagnosis not present

## 2016-07-25 NOTE — Progress Notes (Signed)
Left lower extremity venous duplex is negative for DVT. Preliminary results given to Dr. Yong Channel.

## 2016-08-15 NOTE — Telephone Encounter (Signed)
error 

## 2016-08-22 ENCOUNTER — Other Ambulatory Visit: Payer: Self-pay | Admitting: Family Medicine

## 2016-09-17 ENCOUNTER — Telehealth: Payer: Self-pay

## 2016-09-17 NOTE — Telephone Encounter (Signed)
Pt presented to office with ongoing c/o left lower leg edema and rash. Pt states that she was given "a cream" in the past and that while using this the rash improved, once stopped medication rash returned. Pt states area does itch. Small area of edema noted on interior area left lower calf region. No diffuse edema or localized redness. Area is tender to touch. No significant mass palpated, area feels more like fluid swelling. Pt is on HCTZ to help with edema issues. Pt reports that area does not cause pain when standing/walking. Pt has noticed same area for >66months with no change. Recent U/S of area was negative for DVT or other blood clot.   Appt scheduled with [name], NP 09/18/16. Pt aware. Pt advised to seek ED care if area becomes red, warm to touch or has increased pain. Pt voiced understanding. Nothing further needed at this time.

## 2016-09-18 ENCOUNTER — Encounter: Payer: Self-pay | Admitting: Adult Health

## 2016-09-18 ENCOUNTER — Ambulatory Visit (INDEPENDENT_AMBULATORY_CARE_PROVIDER_SITE_OTHER): Payer: PPO | Admitting: Adult Health

## 2016-09-18 VITALS — BP 138/74 | Temp 97.9°F | Wt 179.2 lb

## 2016-09-18 DIAGNOSIS — M79605 Pain in left leg: Secondary | ICD-10-CM

## 2016-09-18 NOTE — Progress Notes (Signed)
Subjective:    Patient ID: Tammie [name], female    DOB: 1935-06-02, 82 y.o.   MRN: [id number]  HPI  82 year old female who presents to the office today for the complaint of left lower extremity swelling/pain. Sh was seen by her PCP, Dr. Yong Channel in November 2017 for this issue. At which time it was felt this was a superficial clot. She had US done which was negative for DVT. She reports no SOB , CP, redness, or warmth. She has no calf tenderness.    Review of Systems  Constitutional: Negative.   Respiratory: Negative.   Cardiovascular: Positive for leg swelling.  Neurological: Negative.   Psychiatric/Behavioral: Negative.   All other systems reviewed and are negative.  Past Medical History:  Diagnosis Date  . Abnormal uterine bleeding   . Allergic rhinitis   . Allergy    seasonal  . Anxiety    panic attacks  . Anxiety disorder   . Asthma   . Cataract    left eye surgery to removed  . Cataract    right eye  . Colorectal cancer ([place]) 1997   T3, N0  . Depression    pt states she isn't depressed anymore, continues paxil daily  . Endometriosis   . Fibroid   . GERD (gastroesophageal reflux disease)   . Hemorrhoids   . Hyperlipidemia    diet controlled, no meds  . Hypertension   . PONV (postoperative nausea and vomiting)   . Seasonal allergies   . SVD (spontaneous vaginal delivery)    x 3  . Thyroid disease   . Urinary incontinence     Social History   Social History  . Marital status: Divorced    Spouse name: N/A  . Number of children: N/A  . Years of education: N/A   Occupational History  . Not on file.   Social History Main Topics  . Smoking status: Never Smoker  . Smokeless tobacco: Never Used  . Alcohol use 0.0 oz/week     Comment: occasional wine  . Drug use: No  . Sexual activity: No   Other Topics Concern  . Not on file   Social History Narrative   Divorced. Lives alone.  Two children. 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 cataract surgery Left   . COLECTOMY  1997   SIGMOID COLECTOMY 1997  . COLON SURGERY  1997  . COLONOSCOPY    . DILATATION & CURETTAGE/HYSTEROSCOPY WITH MYOSURE N/A 11/21/2015   Procedure: DILATATION & CURETTAGE/HYSTEROSCOPY WITH MYOSURE;  Surgeon: Salvadore Dom, MD;  Location: [facility];  Service: Gynecology;  Laterality: N/A;  . DILATION AND CURETTAGE OF UTERUS     D & C  . POLYPECTOMY    . TONSILLECTOMY      Family History  Problem Relation Age of Onset  . Sudden death Brother 26    Apparent DVT/PE. all males  . Heart attack Brother   . Sudden death Cousin 39    Paternal cousin -dvt/pe 7 all males  . Coronary artery disease Cousin 87    Paternal cousin, MI  . Varicose Veins Mother   . Colon cancer Neg Hx   . Rectal cancer Neg Hx   .  Stomach cancer Neg Hx   . Breast cancer Daughter 20  . Hypertension Father     Allergies  Allergen Reactions  . Erythromycin     unknown  . Fentanyl Hives  . Heparin     questionable, unknown  . Hydrocodone-Acetaminophen Hives  . Meperidine Hcl     unknown  . Penicillins     Has patient had a PCN reaction causing immediate rash, facial/tongue/throat swelling, SOB or lightheadedness with hypotension: no Has patient had a PCN reaction causing severe rash involving mucus membranes or skin necrosis: no Has patient had a PCN reaction that required hospitalization no Has patient had a PCN reaction occurring within the last 10 years: no If all of the above answers are "NO", then may proceed with Cephalosporin use.   . Procaine Hcl     Could not sleep  . Promethazine Hcl     unknown  . Temazepam Hives    unknown    Current Outpatient Prescriptions on File Prior to Visit  Medication Sig  Dispense Refill  . albuterol (PROVENTIL HFA;VENTOLIN HFA) 108 (90 Base) MCG/ACT inhaler Inhale 2 puffs into the lungs every 6 (six) hours as needed for wheezing or shortness of breath. 1 Inhaler 0  . amLODipine (NORVASC) 5 MG tablet TAKE 1 to 2 TABLETS BY MOUTH EVERY DAY depending ON blood pressure 60 tablet 5  . desloratadine (CLARINEX) 5 MG tablet Take 1 tablet (5 mg total) by mouth daily. 30 tablet 5  . fluticasone (FLONASE) 50 MCG/ACT nasal spray PLACE 2 SPRAYS IN EACH NOSTRIL EVERY DAY 16 g 5  . hydrocortisone (PROCTOZONE-HC) 2.5 % rectal cream Place rectally 2 (two) times daily. (Patient taking differently: Place 1 application rectally 2 (two) times daily as needed for hemorrhoids. ) 30 g 0  . lisinopril-hydrochlorothiazide (PRINZIDE,ZESTORETIC) 20-25 MG tablet TAKE 1 TABLET BY MOUTH EVERY DAY 30 tablet 5  . meclizine (ANTIVERT) 25 MG tablet Take 1 tablet (25 mg total) by mouth 2 (two) times daily as needed for dizziness (vertigo). 30 tablet 0  . omeprazole (PRILOSEC) 40 MG capsule TAKE 1 CAPSULE BY MOUTH EVERY DAY 90 capsule 1  . PARoxetine (PAXIL) 20 MG tablet Take 1 tablet (20 mg total) by mouth daily. 30 tablet 5  . SYMBICORT 160-4.5 MCG/ACT inhaler INHALE 2 PUFFS into THE lungs 2 TIMES DAILY 10.2 g 1   No current facility-administered medications on file prior to visit.     BP 138/74   Temp 97.9 F (36.6 C) (Oral)   Wt 179 lb 3.2 oz (81.3 kg)   BMI 31.74 kg/m       Objective:   Physical Exam  Constitutional: She is oriented to person, place, and time. She appears well-developed and well-nourished. No distress.  Cardiovascular: Normal rate, regular rhythm, normal heart sounds and intact distal pulses.  Exam reveals no gallop and no friction rub.   No murmur heard. Pulmonary/Chest: Effort normal and breath sounds normal. No respiratory distress. She has no wheezes. She has no rales. She exhibits no tenderness.  Musculoskeletal: She exhibits edema (Trace edema on left lower  ankle ( medial aspect) . No signs of blood clot. Feels more as lipoma ? ). She exhibits no tenderness.  Neurological: She is alert and oriented to person, place, and time.  Skin: Skin is warm and dry. No rash noted. She is not diaphoretic. No erythema. No pallor.  Psychiatric: She has a normal mood and affect. Her behavior is normal. Thought content normal.  Nursing note  and vitals reviewed.      Assessment & Plan:  1. Left leg pain - No concern for DVT. Advised compression socks and follow up with PCP if needed  [name], NP

## 2016-10-14 ENCOUNTER — Other Ambulatory Visit: Payer: Self-pay | Admitting: Family Medicine

## 2016-12-20 ENCOUNTER — Encounter: Payer: Self-pay | Admitting: Family Medicine

## 2016-12-20 ENCOUNTER — Ambulatory Visit (INDEPENDENT_AMBULATORY_CARE_PROVIDER_SITE_OTHER): Payer: PPO | Admitting: Family Medicine

## 2016-12-20 DIAGNOSIS — L309 Dermatitis, unspecified: Secondary | ICD-10-CM

## 2016-12-20 DIAGNOSIS — M79605 Pain in left leg: Secondary | ICD-10-CM | POA: Diagnosis not present

## 2016-12-20 DIAGNOSIS — I10 Essential (primary) hypertension: Secondary | ICD-10-CM | POA: Diagnosis not present

## 2016-12-20 DIAGNOSIS — J4540 Moderate persistent asthma, uncomplicated: Secondary | ICD-10-CM

## 2016-12-20 DIAGNOSIS — K649 Unspecified hemorrhoids: Secondary | ICD-10-CM | POA: Diagnosis not present

## 2016-12-20 MED ORDER — HYDROCORTISONE 2.5 % RE CREA: 1.0000 "application " | TOPICAL_CREAM | Freq: Two times a day (BID) | RECTAL | 1 refills | Status: DC | PRN

## 2016-12-20 MED ORDER — ALBUTEROL SULFATE HFA 108 (90 BASE) MCG/ACT IN AERS: 2.0000 | INHALATION_SPRAY | Freq: Four times a day (QID) | RESPIRATORY_TRACT | 5 refills | Status: DC | PRN

## 2016-12-20 MED ORDER — CLOBETASOL PROPIONATE 0.05 % EX SOLN: 1.0000 "application " | Freq: Two times a day (BID) | CUTANEOUS | 0 refills | Status: AC

## 2016-12-20 NOTE — Assessment & Plan Note (Signed)
S: controlled on Amlodipine 10mg , hctz 25mg . Due to orthostatic issus in past- goal <150/90.  BP Readings from Last 3 Encounters:  12/20/16 (!) 142/74  09/18/16 138/74  07/23/16 (!) 148/72  A/P:Continue current meds to keep her at [place] 8 goal

## 2016-12-20 NOTE — Patient Instructions (Addendum)
Do not feel any lump in the area  Area is tender due to extra fluid- continue compression stockings since they help you    WE NOW OFFER   [facility] FAST TRACK!!!  SAME DAY Appointments for ACUTE CARE  Such as: Sprains, Injuries, cuts, abrasions, rashes, muscle pain, joint pain, back pain Colds, flu, sore throats, headache, allergies, cough, fever  Ear pain, sinus and eye infections Abdominal pain, nausea, vomiting, diarrhea, upset stomach Animal/insect bites  3 Easy Ways to Schedule: Walk-In Scheduling Call in scheduling Mychart Sign-up: [email or web address]

## 2016-12-20 NOTE — Assessment & Plan Note (Addendum)
Rarely gets external hemorrhoids- wants cream refilled proctozone-HC and refilled today though denies active lesions

## 2016-12-20 NOTE — Assessment & Plan Note (Signed)
Has seen dermatology in past for Scalp dermatitis. Clobetasol 0.05% external solution helps when really flared up. Denies current issues but wants to have on hand- refilled

## 2016-12-20 NOTE — Progress Notes (Signed)
Subjective:  Tammie [name] is a 82 y.o. year old very pleasant female patient who presents for/with See problem oriented charting ROS- no shortness of breath, no calf pain. No fever or chills. No worsening edema. No unilateral edema   Past Medical History-  Patient Active Problem List   Diagnosis Date Noted  . Hypertension     Priority: Medium  . Depression     Priority: Medium  . History of colorectal cancer     Priority: Medium  . Asthma     Priority: Medium  . Left leg pain 12/20/2016    Priority: Low  . Hemorrhoids 12/20/2016    Priority: Low  . Systolic murmur 36/14/4315    Priority: Low  . Allergic rhinitis     Priority: Low  . GERD 06/22/2009    Priority: Low  . Dermatitis 12/20/2016  . Osteopenia 10/02/2015    Medications- reviewed and updated Current Outpatient Prescriptions  Medication Sig Dispense Refill  . albuterol (PROVENTIL HFA;VENTOLIN HFA) 108 (90 Base) MCG/ACT inhaler Inhale 2 puffs into the lungs every 6 (six) hours as needed for wheezing or shortness of breath. 1 Inhaler 5  . amLODipine (NORVASC) 5 MG tablet TAKE 1 to 2 TABLETS BY MOUTH EVERY DAY depending ON blood pressure 60 tablet 5  . desloratadine (CLARINEX) 5 MG tablet Take 1 tablet (5 mg total) by mouth daily. 30 tablet 5  . fluticasone (FLONASE) 50 MCG/ACT nasal spray PLACE 2 SPRAYS IN EACH NOSTRIL EVERY DAY 16 g 2  . hydrocortisone (PROCTOZONE-HC) 2.5 % rectal cream Place 1 application rectally 2 (two) times daily as needed for hemorrhoids. 28.35 g 1  . lisinopril-hydrochlorothiazide (PRINZIDE,ZESTORETIC) 20-25 MG tablet TAKE 1 TABLET BY MOUTH EVERY DAY 30 tablet 5  . meclizine (ANTIVERT) 25 MG tablet Take 1 tablet (25 mg total) by mouth 2 (two) times daily as needed for dizziness (vertigo). 30 tablet 0  . omeprazole (PRILOSEC) 40 MG capsule TAKE 1 CAPSULE BY MOUTH EVERY DAY 90 capsule 1  . PARoxetine (PAXIL) 20 MG tablet Take 1 tablet (20 mg total) by mouth daily. 30 tablet 5  . SYMBICORT  160-4.5 MCG/ACT inhaler INHALE 2 PUFFS into THE lungs 2 TIMES DAILY 10.2 g 1  . clobetasol (TEMOVATE) 0.05 % external solution Apply 1 application topically 2 (two) times daily. For 7-10 days up to twice a day maximum. Use only on scalp 50 mL 0   No current facility-administered medications for this visit.     Objective: BP (!) 142/74 (BP Location: Left Arm, Patient Position: Sitting, Cuff Size: Normal)   Pulse 68   Temp 97.9 F (36.6 C) (Oral)   Wt 179 lb (81.2 kg)   SpO2 97%   BMI 31.71 kg/m  Gen: NAD, resting comfortably CV: RRR no murmurs rubs or gallops Lungs: CTAB no crackles, wheeze, rhonchi Abdomen: soft/nontender/nondistended/normal bowel sounds. No rebound or guarding.  Ext: 1+ edema bilateral. Patient tender to palpation along tibia on both sides. Perhaps slightly more on left lower leg but not significant difference Skin: warm, dry, venous stasis changes bilaterally noted  Assessment/Plan:  Left leg pain S: left leg pain above medial ankle for at least 5 months. Seen in 07/23/16 and thought to have some swelling in area though no calf pain or calf swelling. Due to family history of DVT in female side we opted for venous duplex which was negative- reviewed back to 2014 and had similar workup at that time. Saw [name], NP about 3 months ago for  same issue and reassured.   Today, She states continue to have mild pain and feels a bump in area above left medial ankle. She has been wearing compression stockings OTC (prescription grade she could not get onto her leg or off well) and this helps the pain. When she is on her feet or does not wear these- feels some pain in the area. Mildest of itching in area and thinks it is mildly red (no excoriation and she does have some redness but also has this in more than just this area likely due to venous stasis changes A/P: Although I previously had noted some redness, tenderness, swelling in this area but at this point I see none. Prior  DVT imaging was reassuring. Better with compression- wonder if she has a deeper varicose vein and that is why it is helpful. Encouraged regular compression stocking use and to use prescription grade if she can which se states she cannot. Extensive reassurance provided/needed as patient worried about clot or cancer  Hypertension S: controlled on Amlodipine 10mg , hctz 25mg . Due to orthostatic issus in past- goal <150/90.  BP Readings from Last 3 Encounters:  12/20/16 (!) 142/74  09/18/16 138/74  07/23/16 (!) 148/72  A/P:Continue current meds to keep her at [place] 8 goal  Asthma S: compliant with symbicort BID. This allows her to only use albuterol once every 2-3 weeks A/P: well controlle,d continue current meds   Hemorrhoids Rarely gets external hemorrhoids- wants cream refilled proctozone-HC and refilled today though denies active lesions  Dermatitis Has seen dermatology in past for Scalp dermatitis. Clobetasol 0.05% external solution helps when really flared up. Denies current issues but wants to have on hand- refilled  Meds ordered this encounter  Medications  . albuterol (PROVENTIL HFA;VENTOLIN HFA) 108 (90 Base) MCG/ACT inhaler    Sig: Inhale 2 puffs into the lungs every 6 (six) hours as needed for wheezing or shortness of breath.    Dispense:  1 Inhaler    Refill:  5  . hydrocortisone (PROCTOZONE-HC) 2.5 % rectal cream    Sig: Place 1 application rectally 2 (two) times daily as needed for hemorrhoids.    Dispense:  28.35 g    Refill:  1  . clobetasol (TEMOVATE) 0.05 % external solution    Sig: Apply 1 application topically 2 (two) times daily. For 7-10 days up to twice a day maximum. Use only on scalp    Dispense:  50 mL    Refill:  0    Return precautions advised. PRN for acute issues patient had today.  [name], MD

## 2016-12-20 NOTE — Assessment & Plan Note (Signed)
S: compliant with symbicort BID. This allows her to only use albuterol once every 2-3 weeks A/P: well controlle,d continue current meds

## 2016-12-20 NOTE — Assessment & Plan Note (Signed)
S: left leg pain above medial ankle for at least 5 months. Seen in 07/23/16 and thought to have some swelling in area though no calf pain or calf swelling. Due to family history of DVT in female side we opted for venous duplex which was negative- reviewed back to 2014 and had similar workup at that time. Saw Tammie Peng, NP about 3 months ago for same issue and reassured.   Today, She states continue to have mild pain and feels a bump in area above left medial ankle. She has been wearing compression stockings OTC (prescription grade she could not get onto her leg or off well) and this helps the pain. When she is on her feet or does not wear these- feels some pain in the area. Mildest of itching in area and thinks it is mildly red (no excoriation and she does have some redness but also has this in more than just this area likely due to venous stasis changes A/P: Although I previously had noted some redness, tenderness, swelling in this area but at this point I see none. Prior DVT imaging was reassuring. Better with compression- wonder if she has a deeper varicose vein and that is why it is helpful. Encouraged regular compression stocking use and to use prescription grade if she can which se states she cannot. Extensive reassurance provided/needed as patient worried about clot or cancer

## 2016-12-20 NOTE — Progress Notes (Signed)
Pre visit review using our clinic review tool, if applicable. No additional management support is needed unless otherwise documented below in the visit note. 

## 2016-12-23 ENCOUNTER — Telehealth: Payer: Self-pay

## 2016-12-23 NOTE — Telephone Encounter (Signed)
Received PA request from [facility] for Proventil. PA submitted & is pending. Key: [id number]

## 2017-01-15 ENCOUNTER — Other Ambulatory Visit: Payer: Self-pay | Admitting: Family Medicine

## 2017-01-15 DIAGNOSIS — F411 Generalized anxiety disorder: Secondary | ICD-10-CM

## 2017-03-22 ENCOUNTER — Other Ambulatory Visit: Payer: Self-pay | Admitting: Family Medicine

## 2017-05-16 ENCOUNTER — Other Ambulatory Visit: Payer: Self-pay | Admitting: Family Medicine

## 2017-05-17 ENCOUNTER — Other Ambulatory Visit: Payer: Self-pay | Admitting: Family Medicine

## 2017-06-25 ENCOUNTER — Other Ambulatory Visit: Payer: Self-pay | Admitting: Family Medicine

## 2017-07-15 ENCOUNTER — Encounter: Payer: Self-pay | Admitting: Family Medicine

## 2017-07-15 ENCOUNTER — Ambulatory Visit (INDEPENDENT_AMBULATORY_CARE_PROVIDER_SITE_OTHER): Payer: PPO | Admitting: Family Medicine

## 2017-07-15 VITALS — BP 142/70 | HR 52 | Temp 97.6°F | Ht 63.0 in | Wt 177.4 lb

## 2017-07-15 DIAGNOSIS — K2100 Gastro-esophageal reflux disease with esophagitis, without bleeding: Secondary | ICD-10-CM

## 2017-07-15 DIAGNOSIS — J4541 Moderate persistent asthma with (acute) exacerbation: Secondary | ICD-10-CM

## 2017-07-15 DIAGNOSIS — F410 Panic disorder [episodic paroxysmal anxiety] without agoraphobia: Secondary | ICD-10-CM

## 2017-07-15 DIAGNOSIS — Z79899 Other long term (current) drug therapy: Secondary | ICD-10-CM | POA: Diagnosis not present

## 2017-07-15 DIAGNOSIS — Z0000 Encounter for general adult medical examination without abnormal findings: Secondary | ICD-10-CM | POA: Diagnosis not present

## 2017-07-15 DIAGNOSIS — R42 Dizziness and giddiness: Secondary | ICD-10-CM | POA: Diagnosis not present

## 2017-07-15 DIAGNOSIS — R739 Hyperglycemia, unspecified: Secondary | ICD-10-CM

## 2017-07-15 DIAGNOSIS — I10 Essential (primary) hypertension: Secondary | ICD-10-CM | POA: Diagnosis not present

## 2017-07-15 DIAGNOSIS — E119 Type 2 diabetes mellitus without complications: Secondary | ICD-10-CM | POA: Insufficient documentation

## 2017-07-15 LAB — LIPID PANEL
CHOLESTEROL: 171 mg/dL (ref 0–200)
HDL: 32.6 mg/dL — ABNORMAL LOW (ref >39.00)
LDL Cholesterol: 113 mg/dL — ABNORMAL HIGH (ref 0–99)
NonHDL: 138.32
Total CHOL/HDL Ratio: 5
Triglycerides: 126 mg/dL (ref 0.0–149.0)
VLDL: 25.2 mg/dL (ref 0.0–40.0)

## 2017-07-15 LAB — CBC
HEMATOCRIT: 44.5 % (ref 36.0–46.0)
HEMOGLOBIN: 15.6 g/dL — AB (ref 12.0–15.0)
MCHC: 34.9 g/dL (ref 30.0–36.0)
MCV: 92.9 fl (ref 78.0–100.0)
Platelets: 274 10*3/uL (ref 150.0–400.0)
RBC: 4.79 Mil/uL (ref 3.87–5.11)
RDW: 12.9 % (ref 11.5–15.5)
WBC: 8.7 10*3/uL (ref 4.0–10.5)

## 2017-07-15 LAB — COMPREHENSIVE METABOLIC PANEL
ALK PHOS: 84 U/L (ref 39–117)
ALT: 20 U/L (ref 0–35)
AST: 25 U/L (ref 0–37)
Albumin: 4.3 g/dL (ref 3.5–5.2)
BUN: 13 mg/dL (ref 6–23)
CALCIUM: 10.2 mg/dL (ref 8.4–10.5)
CO2: 33 mEq/L — ABNORMAL HIGH (ref 19–32)
Chloride: 99 mEq/L (ref 96–112)
Creatinine, Ser: 0.64 mg/dL (ref 0.40–1.20)
GFR: 94.93 mL/min (ref >60.00)
Glucose, Bld: 164 mg/dL — ABNORMAL HIGH (ref 70–99)
POTASSIUM: 4.1 meq/L (ref 3.5–5.1)
Sodium: 140 mEq/L (ref 135–145)
TOTAL PROTEIN: 7.2 g/dL (ref 6.0–8.3)
Total Bilirubin: 0.7 mg/dL (ref 0.2–1.2)

## 2017-07-15 LAB — VITAMIN B12: VITAMIN B 12: 658 pg/mL (ref 211–911)

## 2017-07-15 LAB — HEMOGLOBIN A1C: HEMOGLOBIN A1C: 6.1 % (ref 4.6–6.5)

## 2017-07-15 MED ORDER — BUDESONIDE-FORMOTEROL FUMARATE 160-4.5 MCG/ACT IN AERO: INHALATION_SPRAY | RESPIRATORY_TRACT | 5 refills | Status: DC

## 2017-07-15 NOTE — Patient Instructions (Addendum)
Plan Td (tetanus) at pharmacy as well as shingrix if you want and can find it- let us know when you get those shots.   Declined flu today   restart symbicort- let me know if not improving within a month on this. See me back if albuterol usage does not go down to once a week or less within 3-4 weeks.    discussed trial zantac 150mg  twice a day instead of prilosec  Please stop by lab before you go We will send you information about results on mychart

## 2017-07-15 NOTE — Assessment & Plan Note (Signed)
Panic attacks avoided as long as taking paxil 20mg 

## 2017-07-15 NOTE — Assessment & Plan Note (Signed)
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 With head movement or quickly changing positions- short term vertigo that resoles when becomes still. Declines PT for now.

## 2017-07-15 NOTE — Assessment & Plan Note (Signed)
HTN- orthostatic issues if BP under 140 so at goal per JNC8 150/90 with amlodipine 5-10mg , lisinopril-hctz 20-25mg - usually takes half tablet

## 2017-07-15 NOTE — Assessment & Plan Note (Signed)
GERD- on prilosec- dysphagia in past- discussed trial zantac 150mg  twice a day instead of prilosec

## 2017-07-15 NOTE — Progress Notes (Addendum)
Phone: [phone]  Subjective:  Patient presents today for their annual physical. Chief complaint-noted.   See problem oriented charting- ROS- full  review of systems was completed and negative including No chest pain.  No headache or blurry vision. Shortness of breath or wheeze resolved with albuterol. Issues with lightheadedness  The following were reviewed and entered/updated in epic: Past Medical History:  Diagnosis Date  . Abnormal uterine bleeding   . Allergic rhinitis   . Allergy    seasonal  . Anxiety    panic attacks  . Anxiety disorder   . Asthma   . Cataract    left eye surgery to removed  . Cataract    right eye  . Colorectal cancer ([place]) 1997   T3, N0  . Depression    pt states she isn't depressed anymore, continues paxil daily  . Endometriosis   . Fibroid   . GERD (gastroesophageal reflux disease)   . Hemorrhoids   . Hyperlipidemia    diet controlled, no meds  . Hypertension   . PONV (postoperative nausea and vomiting)   . Seasonal allergies   . SVD (spontaneous vaginal delivery)    x 3  . Thyroid disease   . Urinary incontinence    Patient Active Problem List   Diagnosis Date Noted  . Osteopenia 10/02/2015    Priority: High  . Hypertension     Priority: Medium  . Panic attacks     Priority: Medium  . History of colorectal cancer     Priority: Medium  . Asthma     Priority: Medium  . Left leg pain 12/20/2016    Priority: Low  . Hemorrhoids 12/20/2016    Priority: Low  . Dermatitis 12/20/2016    Priority: Low  . Systolic murmur 89/21/1939    Priority: Low  . Allergic rhinitis     Priority: Low  . GERD 06/22/2009    Priority: Low  . Hyperglycemia 07/15/2017  . Vertigo 07/15/2017   Past Surgical History:  Procedure Laterality Date  . cataract surgery Left   . COLECTOMY  1997   SIGMOID COLECTOMY 1997  . COLON SURGERY  1997  . COLONOSCOPY    . DILATATION & CURETTAGE/HYSTEROSCOPY WITH MYOSURE N/A 11/21/2015   Procedure: DILATATION  & CURETTAGE/HYSTEROSCOPY WITH MYOSURE;  Surgeon: Salvadore Dom, MD;  Location: [facility];  Service: Gynecology;  Laterality: N/A;  . DILATION AND CURETTAGE OF UTERUS     D & C  . POLYPECTOMY    . TONSILLECTOMY      Family History  Problem Relation Age of Onset  . Sudden death Brother 79       Apparent DVT/PE. all males  . Heart attack Brother   . Sudden death Cousin 52       Paternal cousin -dvt/pe 7 all males  . Coronary artery disease Cousin 37       Paternal cousin, MI  . Varicose Veins Mother   . Colon cancer Neg Hx   . Rectal cancer Neg Hx   . Stomach cancer Neg Hx   . Breast cancer Daughter 29  . Hypertension Father     Medications- reviewed and updated Current Outpatient Prescriptions  Medication Sig Dispense Refill  . albuterol (PROVENTIL HFA;VENTOLIN HFA) 108 (90 Base) MCG/ACT inhaler Inhale 2 puffs into the lungs every 6 (six) hours as needed for wheezing or shortness of breath. 1 Inhaler 5  . amLODipine (NORVASC) 5 MG tablet TAKE 1 to 2 TABLETS BY MOUTH EVERY DAY  depending ON blood pressure 60 tablet 5  . fluticasone (FLONASE) 50 MCG/ACT nasal spray PLACE 2 SPRAYS IN EACH NOSTRIL EVERY DAY 16 g 2  . hydrocortisone (ANUSOL-HC) 2.5 % rectal cream Place 1 application rectally 2 times daily as needed for hemorrhoids. 28.35 g 1  . lisinopril-hydrochlorothiazide (PRINZIDE,ZESTORETIC) 20-25 MG tablet TAKE 1 TABLET BY MOUTH EVERY DAY 90 tablet 1  . meclizine (ANTIVERT) 25 MG tablet Take 1 tablet (25 mg total) by mouth 2 (two) times daily as needed for dizziness (vertigo). 30 tablet 0  . omeprazole (PRILOSEC) 40 MG capsule TAKE 1 CAPSULE BY MOUTH EVERY DAY 90 capsule 1  . PARoxetine (PAXIL) 20 MG tablet TAKE 1 TABLET BY MOUTH EVERY DAY 90 tablet 1  . clobetasol (TEMOVATE) 0.05 % external solution Apply 1 application topically 2 (two) times daily. For 7-10 days up to twice a day maximum. Use only on scalp (Patient not taking: Reported on 07/15/2017) 50 mL 0  . desloratadine  (CLARINEX) 5 MG tablet Take 1 tablet (5 mg total) by mouth daily. (Patient not taking: Reported on 07/15/2017) 30 tablet 5  . SYMBICORT 160-4.5 MCG/ACT inhaler INHALE 2 PUFFS into THE lungs 2 TIMES DAILY (Patient not taking: Reported on 07/15/2017) 10.2 g 1   Allergies-reviewed and updated Allergies  Allergen Reactions  . Erythromycin     unknown  . Fentanyl Hives  . Heparin     questionable, unknown  . Hydrocodone-Acetaminophen Hives  . Meperidine Hcl     unknown  . Penicillins     Has patient had a PCN reaction causing immediate rash, facial/tongue/throat swelling, SOB or lightheadedness with hypotension: no Has patient had a PCN reaction causing severe rash involving mucus membranes or skin necrosis: no Has patient had a PCN reaction that required hospitalization no Has patient had a PCN reaction occurring within the last 10 years: no If all of the above answers are "NO", then may proceed with Cephalosporin use.   . Procaine Hcl     Could not sleep  . Promethazine Hcl     unknown  . Temazepam Hives    unknown    Social History   Social History  . Marital status: Divorced    Spouse name: N/A  . Number of children: N/A  . Years of education: N/A   Social History Main Topics  . Smoking status: Never Smoker  . Smokeless tobacco: Never Used  . Alcohol use 0.0 oz/week     Comment: occasional wine  . Drug use: No  . Sexual activity: No   Other Topics Concern  . None   Social History Narrative   Divorced. Lives alone.  Two children. 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 Oropharynx normal Neck: no thyromegaly CV: RRR no murmurs rubs or gallops. Not bradycardic on my exam and no murmur as heard prior.  Lungs: CTAB no crackles, wheeze, rhonchi Abdomen: soft/nontender/nondistended/normal bowel  sounds. No rebound or guarding.  Ext: 1+ edema Skin: warm, dry Neuro: grossly normal, moves all extremities, PERRLA  Assessment/Plan:  82 y.o. female presenting for annual physical.  Health Maintenance counseling: 1. Anticipatory guidance: Patient counseled regarding regular dental exams q6 months, eye exams -yearly, wearing seatbelts.  2. Risk factor reduction:  Advised patient of need for regular exercise and diet rich and fruits and vegetables to reduce risk of heart attack and stroke. Exercise- not doing well- encouraged regular exercise- wants to get back into water aerobics. Diet-down 2 lbs from January- thinks loss of ex husband may have affected appetite.  Wt Readings from Last 3 Encounters:  07/15/17 177 lb 6.4 oz (80.5 kg)  12/20/16 179 lb (81.2 kg)  09/18/16 179 lb 3.2 oz (81.3 kg)  3. Immunizations/screenings/ancillary studies- refused flu shot. Declines tdap and shingrix Immunization History  Administered Date(s) Administered  . Pneumococcal Conjugate-13 08/09/2014  . Pneumococcal Polysaccharide-23 10/19/2015  4. Cervical cancer screening- passed age based screening, no history abnormal pap within 15-20 years. Had polypectomy 2017 with Dr. Talbert Nan.  5. Breast cancer screening-  Passed age based screening- declines mammogram.  6. Colon cancer screening - 09/29/14 with no repeat due to age even with history of colon cancer in 1997.  7. Skin cancer screening- wants to get plugged back in. advised regular sunscreen use. Denies worrisome, changing, or new skin lesions.  8. Osteoporosis screening at 60- 09/27/15 osteopenia. Declines fosamax- repeat 2019 and if worse  will consider fosamax if worsening. Taking calcium and vitamin D  Status of chronic or acute concerns   proctozone HC for hemorrhoids prn  Scalp dermatitis controlled for most part- wants to go back to dermatology  Check b12 with long term ppi  Hyperglycemia- update a1c  Fungal rash likely left groin- improves with miconazole OTC- worsens with hydrocortisone. Discussed 2-3 weeks to heal. Follow up if not improving  Asthma Asthma- on symbicort BID in past and albuterol ever 2-3 weeks. Stopped symbicort and symptoms worsened to daily use of albuterol. Agrees to restart symbicort  Panic attacks Panic attacks avoided as long as taking paxil 20mg   Hypertension HTN- orthostatic issues if BP under 140 so at goal per JNC8 150/90 with amlodipine 5-10mg , lisinopril-hctz 20-25mg - usually takes half tablet   GERD GERD- on prilosec- dysphagia in past- discussed trial zantac 150mg  twice a day instead of prilosec  Vertigo No recent severe vertigo (gets mild episodes)- has meclizine in past for likely BPPV. With head movement or quickly changing positions- short term vertigo that resoles when becomes still. Declines PT for now.   6 months  Orders Placed This Encounter  Procedures  . Lipid panel    [place]    Order Specific Question:   Has the patient fasted?    Answer:   No  . CBC    [place]  . Comprehensive metabolic panel    [place]    Order Specific Question:   Has the patient fasted?    Answer:   No  . Hemoglobin A1c      . Vitamin B12    Meds ordered this encounter  Medications  . budesonide-formoterol (SYMBICORT) 160-4.5 MCG/ACT inhaler    Sig: INHALE 2 PUFFS into THE lungs 2 TIMES DAILY    Dispense:  10.2 g    Refill:  5    Return precautions advised.  [name], MD

## 2017-07-15 NOTE — Assessment & Plan Note (Signed)
Asthma- on symbicort BID in past and albuterol ever 2-3 weeks. Stopped symbicort and symptoms worsened to daily use of albuterol. Agrees to restart symbicort

## 2017-09-10 ENCOUNTER — Other Ambulatory Visit: Payer: Self-pay | Admitting: Family Medicine

## 2017-09-10 DIAGNOSIS — F411 Generalized anxiety disorder: Secondary | ICD-10-CM

## 2017-09-17 ENCOUNTER — Encounter: Payer: Self-pay | Admitting: Family Medicine

## 2017-09-17 ENCOUNTER — Ambulatory Visit (INDEPENDENT_AMBULATORY_CARE_PROVIDER_SITE_OTHER): Payer: PPO | Admitting: Family Medicine

## 2017-09-17 VITALS — BP 136/72 | HR 64 | Temp 98.2°F | Ht 63.0 in | Wt 176.8 lb

## 2017-09-17 DIAGNOSIS — J0100 Acute maxillary sinusitis, unspecified: Secondary | ICD-10-CM

## 2017-09-17 DIAGNOSIS — J4541 Moderate persistent asthma with (acute) exacerbation: Secondary | ICD-10-CM

## 2017-09-17 MED ORDER — GUAIFENESIN-CODEINE 100-10 MG/5ML PO SYRP: 10.0000 mL | ORAL_SOLUTION | Freq: Every evening | ORAL | 0 refills | Status: DC | PRN

## 2017-09-17 MED ORDER — PREDNISONE 5 MG PO TABS: 5.0000 mg | ORAL_TABLET | Freq: Every day | ORAL | 0 refills | Status: DC

## 2017-09-17 MED ORDER — AZITHROMYCIN 250 MG PO TABS: ORAL_TABLET | ORAL | 0 refills | Status: DC

## 2017-09-17 NOTE — Progress Notes (Signed)
Tammie [name] is a 83 y.o. female here for an acute visit.  History of Present Illness:   Shaune Pascal CMA acting as scribe for Dr. Juleen China.  Cough  This is a new problem. The current episode started 1 to 4 weeks ago. The problem has been gradually improving. The problem occurs every few hours. The cough is non-productive. Associated symptoms include nasal congestion, rhinorrhea and wheezing. The symptoms are aggravated by lying down. She has tried OTC cough suppressant for the symptoms. The treatment provided mild relief. Her past medical history is significant for asthma.   PMHx, SurgHx, SocialHx, Medications, and Allergies were reviewed in the Visit Navigator and updated as appropriate.  Current Medications:   Current Outpatient Medications:  .  albuterol (PROVENTIL HFA;VENTOLIN HFA) 108 (90 Base) MCG/ACT inhaler, Inhale 2 puffs into the lungs every 6 (six) hours as needed for wheezing or shortness of breath., Disp: 1 Inhaler, Rfl: 5 .  amLODipine (NORVASC) 5 MG tablet, TAKE 1 to 2 TABLETS BY MOUTH EVERY DAY depending ON blood pressure, Disp: 60 tablet, Rfl: 5 .  budesonide-formoterol (SYMBICORT) 160-4.5 MCG/ACT inhaler, INHALE 2 PUFFS into THE lungs 2 TIMES DAILY, Disp: 10.2 g, Rfl: 5 .  clobetasol (TEMOVATE) 0.05 % external solution, Apply 1 application topically 2 (two) times daily. For 7-10 days up to twice a day maximum. Use only on scalp, Disp: 50 mL, Rfl: 0 .  desloratadine (CLARINEX) 5 MG tablet, Take 1 tablet (5 mg total) by mouth daily., Disp: 30 tablet, Rfl: 5 .  fluticasone (FLONASE) 50 MCG/ACT nasal spray, PLACE 2 SPRAYS IN EACH NOSTRIL EVERY DAY, Disp: 16 g, Rfl: 2 .  hydrocortisone (ANUSOL-HC) 2.5 % rectal cream, Place 1 application rectally 2 times daily as needed for hemorrhoids., Disp: 28.35 g, Rfl: 1 .  lisinopril-hydrochlorothiazide (PRINZIDE,ZESTORETIC) 20-25 MG tablet, TAKE 1 TABLET BY MOUTH EVERY DAY, Disp: 90 tablet, Rfl: 1 .  meclizine (ANTIVERT) 25 MG tablet,  Take 1 tablet (25 mg total) by mouth 2 (two) times daily as needed for dizziness (vertigo)., Disp: 30 tablet, Rfl: 0 .  omeprazole (PRILOSEC) 40 MG capsule, TAKE 1 CAPSULE BY MOUTH EVERY DAY, Disp: 90 capsule, Rfl: 1 .  PARoxetine (PAXIL) 20 MG tablet, TAKE 1 TABLET BY MOUTH EVERY DAY, Disp: 90 tablet, Rfl: 1   Allergies  Allergen Reactions  . Erythromycin     unknown  . Fentanyl Hives  . Heparin     questionable, unknown  . Hydrocodone-Acetaminophen Hives  . Meperidine Hcl     unknown  . Penicillins     Has patient had a PCN reaction causing immediate rash, facial/tongue/throat swelling, SOB or lightheadedness with hypotension: no Has patient had a PCN reaction causing severe rash involving mucus membranes or skin necrosis: no Has patient had a PCN reaction that required hospitalization no Has patient had a PCN reaction occurring within the last 10 years: no If all of the above answers are "NO", then may proceed with Cephalosporin use.   . Procaine Hcl     Could not sleep  . Promethazine Hcl     unknown  . Temazepam Hives    unknown   Review of Systems:   Pertinent items are noted in the HPI. Otherwise, ROS is negative.  Vitals:   Vitals:   09/17/17 1358  BP: 136/72  Pulse: 64  Temp: 98.2 F (36.8 C)  TempSrc: Oral  SpO2: 94%  Weight: 176 lb 12.8 oz (80.2 kg)  Height: 5\' 3"  (1.6 m)  Body mass index is 31.32 kg/m.  Physical Exam:   Physical Exam  Constitutional: She appears well-developed and well-nourished. No distress.  HENT:  Head: Normocephalic and atraumatic.  Left Ear: Tympanic membrane normal.  Right cerumen.  Eyes: EOM are normal. Pupils are equal, round, and reactive to light.  Neck: Normal range of motion. Neck supple.  Cardiovascular: Normal rate, regular rhythm, normal heart sounds and intact distal pulses.  Pulmonary/Chest: Effort normal. She has wheezes. She has rhonchi.  Abdominal: Soft.  Skin: Skin is warm.  Psychiatric: She has a  normal mood and affect. Her behavior is normal.  Nursing note and vitals reviewed.   Assessment and Plan:   1. Subacute maxillary sinusitis - predniSONE (DELTASONE) 5 MG tablet; Take 1 tablet (5 mg total) by mouth daily with breakfast. 6-5-4-3-2-1-off  Dispense: 21 tablet; Refill: 0  2. Moderate persistent asthma with exacerbation - azithromycin (ZITHROMAX) 250 MG tablet; 2 po on day one, then one po each day until gone.  Dispense: 6 tablet; Refill: 0 - guaiFENesin-codeine (CHERATUSSIN AC) 100-10 MG/5ML syrup; Take 10 mLs by mouth at bedtime as needed for cough or congestion.  Dispense: 120 mL; Refill: 0   . Reviewed expectations re: course of current medical issues. . Discussed self-management of symptoms. . Outlined signs and symptoms indicating need for more acute intervention. . Patient verbalized understanding and all questions were answered. [name] Health Maintenance issues including appropriate healthy diet, exercise, and smoking avoidance were discussed with patient. . See orders for this visit as documented in the electronic medical record. . Patient received an After Visit Summary.  CMA served as Education administrator during this visit. History, Physical, and Plan performed by medical provider. The above documentation has been reviewed and is accurate and complete. Briscoe Deutscher, D.O.  Briscoe Deutscher, DO [place], [facility] 09/17/2017

## 2017-09-24 DIAGNOSIS — L218 Other seborrheic dermatitis: Secondary | ICD-10-CM | POA: Diagnosis not present

## 2017-10-07 ENCOUNTER — Other Ambulatory Visit: Payer: Self-pay | Admitting: Family Medicine

## 2017-11-11 ENCOUNTER — Telehealth: Payer: Self-pay | Admitting: Family Medicine

## 2017-11-11 NOTE — Telephone Encounter (Signed)
See Note

## 2017-11-11 NOTE — Telephone Encounter (Signed)
Copied from [place]. Topic: Quick Communication - See Telephone Encounter >> Nov 11, 2017  1:19 PM Percell Belt A wrote: CRM for notification. See Telephone encounter for:  pt called in and would like to speak to just Morgan.  She stated that she went dentist today and bp was up .  Ask if she would like to talk to one of my triage nurses, she would wanted to speak with Roselyn Reef .  She wanted to know if she could take some extra bp meds   Best number [phone]   11/11/17.

## 2017-11-11 NOTE — Telephone Encounter (Signed)
Great advise Roselyn Reef- thanks

## 2017-11-11 NOTE — Telephone Encounter (Signed)
Called and spoke with patient who stated she went to the dentist and had a surgical procedure. Her blood pressure reading at the dentist were high. She has been cutting her BP medicine in half. She stated she has been checking it at home for the last week and noticed it had been slightly high at home. I encouraged her to take the BP medicine as directed, Take her BP at home everyday at the same time, Record her BP and follow up in 2 weeks. I did advise her if she wasn't tolerating the med to call our office and schedule a follow up appointment before the 2 weeks. She verbalized understanding

## 2017-12-11 DIAGNOSIS — H524 Presbyopia: Secondary | ICD-10-CM | POA: Diagnosis not present

## 2017-12-23 ENCOUNTER — Other Ambulatory Visit: Payer: Self-pay | Admitting: Family Medicine

## 2017-12-23 DIAGNOSIS — J4541 Moderate persistent asthma with (acute) exacerbation: Secondary | ICD-10-CM

## 2018-01-08 ENCOUNTER — Other Ambulatory Visit: Payer: Self-pay | Admitting: Family Medicine

## 2018-01-08 DIAGNOSIS — L821 Other seborrheic keratosis: Secondary | ICD-10-CM | POA: Diagnosis not present

## 2018-01-08 DIAGNOSIS — L819 Disorder of pigmentation, unspecified: Secondary | ICD-10-CM | POA: Diagnosis not present

## 2018-01-08 DIAGNOSIS — L570 Actinic keratosis: Secondary | ICD-10-CM | POA: Diagnosis not present

## 2018-01-15 ENCOUNTER — Ambulatory Visit (INDEPENDENT_AMBULATORY_CARE_PROVIDER_SITE_OTHER): Payer: PPO | Admitting: Family Medicine

## 2018-01-15 ENCOUNTER — Encounter: Payer: Self-pay | Admitting: Family Medicine

## 2018-01-15 VITALS — BP 132/76 | HR 57 | Temp 98.0°F | Ht 63.0 in | Wt 177.2 lb

## 2018-01-15 DIAGNOSIS — J4540 Moderate persistent asthma, uncomplicated: Secondary | ICD-10-CM

## 2018-01-15 DIAGNOSIS — I10 Essential (primary) hypertension: Secondary | ICD-10-CM | POA: Diagnosis not present

## 2018-01-15 DIAGNOSIS — R739 Hyperglycemia, unspecified: Secondary | ICD-10-CM

## 2018-01-15 DIAGNOSIS — E785 Hyperlipidemia, unspecified: Secondary | ICD-10-CM

## 2018-01-15 NOTE — Progress Notes (Signed)
Subjective:  Tammie [name] is a 83 y.o. year old very pleasant female patient who presents for/with See problem oriented charting ROS- No chest pain. Occasional wheeze/ shortness of breath which resolves with albuterol. No headache or blurry vision.     Past Medical History-  Patient Active Problem List   Diagnosis Date Noted  . Hyperglycemia 07/15/2017    Priority: High  . Osteopenia 10/02/2015    Priority: High  . Hyperlipidemia 01/15/2018    Priority: Medium  . Vertigo 07/15/2017    Priority: Medium  . Hypertension     Priority: Medium  . Panic attacks     Priority: Medium  . History of colorectal cancer     Priority: Medium  . Asthma     Priority: Medium  . Left leg pain 12/20/2016    Priority: Low  . Hemorrhoids 12/20/2016    Priority: Low  . Dermatitis 12/20/2016    Priority: Low  . Systolic murmur 13/24/4010    Priority: Low  . Allergic rhinitis     Priority: Low  . GERD 06/22/2009    Priority: Low    Medications- reviewed and updated Current Outpatient Medications  Medication Sig Dispense Refill  . amLODipine (NORVASC) 5 MG tablet TAKE 1 to 2 TABLETS BY MOUTH EVERY DAY depending ON blood pressure 60 tablet 5  . budesonide-formoterol (SYMBICORT) 160-4.5 MCG/ACT inhaler INHALE 2 PUFFS INTO THE LUNGS 2 TIMES DAILY 10.2 g 5  . clobetasol (TEMOVATE) 0.05 % external solution Apply 1 application topically 2 (two) times daily. For 7-10 days up to twice a day maximum. Use only on scalp 50 mL 0  . desloratadine (CLARINEX) 5 MG tablet Take 1 tablet (5 mg total) by mouth daily. 30 tablet 5  . fluticasone (FLONASE) 50 MCG/ACT nasal spray PLACE 2 SPRAYS IN EACH NOSTRIL EVERY DAY 16 g 2  . hydrocortisone (ANUSOL-HC) 2.5 % rectal cream Place 1 application rectally 2 times daily as needed for hemorrhoids. 28.35 g 1  . lisinopril-hydrochlorothiazide (PRINZIDE,ZESTORETIC) 20-25 MG tablet TAKE 1 TABLET BY MOUTH EVERY DAY 90 tablet 1  . meclizine (ANTIVERT) 25 MG tablet Take 1  tablet (25 mg total) by mouth 2 (two) times daily as needed for dizziness (vertigo). 30 tablet 0  . omeprazole (PRILOSEC) 40 MG capsule TAKE 1 CAPSULE BY MOUTH EVERY DAY 90 capsule 1  . PARoxetine (PAXIL) 20 MG tablet TAKE 1 TABLET BY MOUTH EVERY DAY 90 tablet 1  . PROAIR HFA 108 (90 Base) MCG/ACT inhaler Inhale 2 puffs into the lungs every 6 hours as needed for wheezing or shortness of breath. 8.5 g 2   No current facility-administered medications for this visit.     Objective: BP 132/76 (BP Location: Left Arm, Patient Position: Sitting, Cuff Size: Normal)   Pulse (!) 57   Temp 98 F (36.7 C) (Oral)   Ht 5\' 3"  (1.6 m)   Wt 177 lb 3.2 oz (80.4 kg)   SpO2 98%   BMI 31.39 kg/m  Gen: NAD, resting comfortably CV: RRR no murmurs rubs or gallops. Not bradycardic on my exam Lungs: CTAB no crackles, wheeze, rhonchi Abdomen: soft/nontender/nondistended/normal bowel sounds. Ext: trace edema Skin: warm, dry Neuro: normal gait and speech  Assessment/Plan:  Other notes: 1. lost his ex husband who she had grown really close to again- lost to cancer.  Consoled patient on loss 2. States has history precancerous polyps -"Please follow up with gynecology- go ahead and call today- due to your vaginal bleeding" 4. b12 levels  normal last check while on prilosec. Some b12 in MV  Hypertension S: controlled on amlodipine 5 mg for most part, lisinopril hctz 20-25mg  (now taking full tablet) BP Readings from Last 3 Encounters:  01/15/18 132/76  09/17/17 136/72  07/15/17 (!) 142/70  A/P: We discussed blood pressure goal of <140/90. Continue current meds  Hyperglycemia S: Patient has not made much progress in regards to her weight, healthy eating  Lab Results  Component Value Date   HGBA1C 6.1 07/15/2017   HGBA1C 5.8 02/09/2015   HGBA1C 5.4 07/28/2013  A/P: we opted to give her a little bit longer to work [employer], exercise.  We will update labs in 6 months including an A1c.  Hyperlipidemia S: At  time of last visit lipids were noted to be elevated and 10-year risk of heart attack or stroke per ASCVD risk estimator was above 30%.  I advised low-dose pravastatin in hopes not to  increased diabetes risk significantly-she declined that option A/P: We discussed again the option of statin and she declines.  She agrees to consider if cholesterol levels remain elevated at her visit in 6 months.  We will also need to update a CBC due to slightly high hemoglobin at that time.  Also will check CMP  Asthma S: Patient continues on her Symbicort.  She had not had to use her albuterol much at all until allergy season.  At this point continues to use albuterol 2 times a week or less A/P: Continue current medication   Future Appointments  Date Time Provider [place]  07/22/2018  8:45 AM Marin Olp, MD [facility]   Return in about 6 months (around 07/18/2018) for physical, come fasting.  Return precautions advised.  [name], MD

## 2018-01-15 NOTE — Assessment & Plan Note (Signed)
S: At time of last visit lipids were noted to be elevated and 10-year risk of heart attack or stroke per ASCVD risk estimator was above 30%.  I advised low-dose pravastatin in hopes not to  increased diabetes risk significantly-she declined that option A/P: We discussed again the option of statin and she declines.  She agrees to consider if cholesterol levels remain elevated at her visit in 6 months.  We will also need to update a CBC due to slightly high hemoglobin at that time.  Also will check CMP

## 2018-01-15 NOTE — Assessment & Plan Note (Signed)
S: Patient has not made much progress in regards to her weight, healthy eating  Lab Results  Component Value Date   HGBA1C 6.1 07/15/2017   HGBA1C 5.8 02/09/2015   HGBA1C 5.4 07/28/2013  A/P: we opted to give her a little bit longer to work [employer], exercise.  We will update labs in 6 months including an A1c.

## 2018-01-15 NOTE — Assessment & Plan Note (Signed)
S: Patient continues on her Symbicort.  She had not had to use her albuterol much at all until allergy season.  At this point continues to use albuterol 2 times a week or less A/P: Continue current medication

## 2018-01-15 NOTE — Assessment & Plan Note (Signed)
S: controlled on amlodipine 5 mg for most part, lisinopril hctz 20-25mg  (now taking full tablet) BP Readings from Last 3 Encounters:  01/15/18 132/76  09/17/17 136/72  07/15/17 (!) 142/70  A/P: We discussed blood pressure goal of <140/90. Continue current meds

## 2018-01-15 NOTE — Patient Instructions (Addendum)
No changes today  Would love to see you lose at least 5-10 lbs by next visit to reduce your risk of diabetes  Working on weight loss can help cholesterol as well  Please follow up with gynecology- go ahead and call today- due to your vaginal bleeding

## 2018-01-16 ENCOUNTER — Telehealth: Payer: Self-pay

## 2018-01-16 NOTE — Telephone Encounter (Signed)
Called patient to let her know Dr. Yong Channel needed to see her in order to evaluate her leg/knee numbness. I scheduled her an appointment for her on May 23rd to see Dr. Yong Channel.

## 2018-01-26 DIAGNOSIS — Z9842 Cataract extraction status, left eye: Secondary | ICD-10-CM | POA: Diagnosis not present

## 2018-01-26 DIAGNOSIS — Z87828 Personal history of other (healed) physical injury and trauma: Secondary | ICD-10-CM | POA: Diagnosis not present

## 2018-01-26 DIAGNOSIS — H40003 Preglaucoma, unspecified, bilateral: Secondary | ICD-10-CM | POA: Diagnosis not present

## 2018-01-26 DIAGNOSIS — H26492 Other secondary cataract, left eye: Secondary | ICD-10-CM | POA: Diagnosis not present

## 2018-01-26 DIAGNOSIS — H31092 Other chorioretinal scars, left eye: Secondary | ICD-10-CM | POA: Diagnosis not present

## 2018-01-26 DIAGNOSIS — Z961 Presence of intraocular lens: Secondary | ICD-10-CM | POA: Diagnosis not present

## 2018-01-26 DIAGNOSIS — H2511 Age-related nuclear cataract, right eye: Secondary | ICD-10-CM | POA: Diagnosis not present

## 2018-01-26 DIAGNOSIS — H52202 Unspecified astigmatism, left eye: Secondary | ICD-10-CM | POA: Diagnosis not present

## 2018-02-05 ENCOUNTER — Encounter: Payer: Self-pay | Admitting: Family Medicine

## 2018-02-05 ENCOUNTER — Ambulatory Visit (INDEPENDENT_AMBULATORY_CARE_PROVIDER_SITE_OTHER): Payer: PPO | Admitting: Family Medicine

## 2018-02-05 ENCOUNTER — Ambulatory Visit (INDEPENDENT_AMBULATORY_CARE_PROVIDER_SITE_OTHER): Payer: PPO

## 2018-02-05 VITALS — BP 140/72 | HR 59 | Temp 98.6°F | Ht 63.0 in | Wt 177.6 lb

## 2018-02-05 DIAGNOSIS — M5442 Lumbago with sciatica, left side: Secondary | ICD-10-CM

## 2018-02-05 DIAGNOSIS — G8929 Other chronic pain: Secondary | ICD-10-CM | POA: Diagnosis not present

## 2018-02-05 DIAGNOSIS — J4541 Moderate persistent asthma with (acute) exacerbation: Secondary | ICD-10-CM

## 2018-02-05 DIAGNOSIS — M5441 Lumbago with sciatica, right side: Secondary | ICD-10-CM | POA: Diagnosis not present

## 2018-02-05 DIAGNOSIS — M5136 Other intervertebral disc degeneration, lumbar region: Secondary | ICD-10-CM | POA: Diagnosis not present

## 2018-02-05 DIAGNOSIS — I10 Essential (primary) hypertension: Secondary | ICD-10-CM

## 2018-02-05 DIAGNOSIS — K2100 Gastro-esophageal reflux disease with esophagitis, without bleeding: Secondary | ICD-10-CM

## 2018-02-05 MED ORDER — BUDESONIDE-FORMOTEROL FUMARATE 160-4.5 MCG/ACT IN AERO: INHALATION_SPRAY | RESPIRATORY_TRACT | 5 refills | Status: DC

## 2018-02-05 NOTE — Patient Instructions (Signed)
Stop by x-ray before you leave  If symptoms worsen-we could trial a course of prednisone-you declined this for now.  I would want to see you immediately if you develop weakness in the legs is worsening, more persistent symptoms of numbness not dependent on laying on the left side  Another option would be to have you see Dr. Paulla Fore our sports medicine specialist-you can simply call us if you are interested in this

## 2018-02-05 NOTE — Assessment & Plan Note (Signed)
S: Patient trial of Zantac but had significant breakthrough of reflux symptoms.  Omeprazole works far better from her and she restarted  A/P: Continue omeprazole given for control on over-the-counter Zantac

## 2018-02-05 NOTE — Assessment & Plan Note (Addendum)
S: controlled on  amlodipine 10mg  pills (often takes 5 mg),  Lisinopril-hctz  20-25mg - BP goal <150/90 due to orthostatic hypotension history BP Readings from Last 3 Encounters:  02/05/18 140/72  01/15/18 132/76  09/17/17 136/72  A/P: We discussed blood pressure goal of <140/90. Continue current meds

## 2018-02-05 NOTE — Progress Notes (Signed)
Subjective:  Tammie [name] is a 83 y.o. year old very pleasant female patient who presents for/with See problem oriented charting ROS-no fecal or urinary incontinence.  No saddle anesthesia.  No falls.  Admits to numbness in both feet at times.  Past Medical History-  Patient Active Problem List   Diagnosis Date Noted  . Hyperglycemia 07/15/2017    Priority: High  . Osteopenia 10/02/2015    Priority: High  . Hyperlipidemia 01/15/2018    Priority: Medium  . Vertigo 07/15/2017    Priority: Medium  . Hypertension     Priority: Medium  . Panic attacks     Priority: Medium  . History of colorectal cancer     Priority: Medium  . Asthma     Priority: Medium  . Left leg pain 12/20/2016    Priority: Low  . Hemorrhoids 12/20/2016    Priority: Low  . Dermatitis 12/20/2016    Priority: Low  . Systolic murmur 81/09/7508    Priority: Low  . Allergic rhinitis     Priority: Low  . GERD 06/22/2009    Priority: Low    Medications- reviewed and updated Current Outpatient Medications  Medication Sig Dispense Refill  . amLODipine (NORVASC) 5 MG tablet TAKE 1 to 2 TABLETS BY MOUTH EVERY DAY depending ON blood pressure 60 tablet 5  . budesonide-formoterol (SYMBICORT) 160-4.5 MCG/ACT inhaler INHALE 2 PUFFS INTO THE LUNGS 2 TIMES DAILY 10.2 g 5  . clobetasol (TEMOVATE) 0.05 % external solution Apply 1 application topically 2 (two) times daily. For 7-10 days up to twice a day maximum. Use only on scalp 50 mL 0  . desloratadine (CLARINEX) 5 MG tablet Take 1 tablet (5 mg total) by mouth daily. 30 tablet 5  . fluticasone (FLONASE) 50 MCG/ACT nasal spray PLACE 2 SPRAYS IN EACH NOSTRIL EVERY DAY 16 g 2  . hydrocortisone (ANUSOL-HC) 2.5 % rectal cream Place 1 application rectally 2 times daily as needed for hemorrhoids. 28.35 g 1  . lisinopril-hydrochlorothiazide (PRINZIDE,ZESTORETIC) 20-25 MG tablet TAKE 1 TABLET BY MOUTH EVERY DAY 90 tablet 1  . meclizine (ANTIVERT) 25 MG tablet Take 1 tablet (25  mg total) by mouth 2 (two) times daily as needed for dizziness (vertigo). 30 tablet 0  . omeprazole (PRILOSEC) 40 MG capsule TAKE 1 CAPSULE BY MOUTH EVERY DAY 90 capsule 1  . PARoxetine (PAXIL) 20 MG tablet TAKE 1 TABLET BY MOUTH EVERY DAY 90 tablet 1  . PROAIR HFA 108 (90 Base) MCG/ACT inhaler Inhale 2 puffs into the lungs every 6 hours as needed for wheezing or shortness of breath. 8.5 g 2   No current facility-administered medications for this visit.     Objective: BP 140/72 (BP Location: Left Arm, Patient Position: Sitting, Cuff Size: Large)   Pulse (!) 59   Temp 98.6 F (37 C) (Oral)   Ht 5\' 3"  (1.6 m)   Wt 177 lb 9.6 oz (80.6 kg)   SpO2 97%   BMI 31.46 kg/m  Gen: NAD, resting comfortably CV: RRR no  rubs Lungs: Nonlabored, normal respiratory rate Abdomen: soft/nontender/nondistended/normal bowel sounds.  Ext: no edema Skin: warm, dry  Back - Normal skin, Spine with normal alignment and no deformity.  No tenderness to vertebral process palpation.  Paraspinous muscles are not tender and without spasm.   Range of motion is full at neck and lumbar sacral regions. Negative Straight leg raise.  I am not able to replicate her numbness on exam today Neuro- no saddle anesthesia, 5/5  strength lower extremities, 2+ reflexes   Assessment/Plan:  Chronic bilateral low back pain with bilateral sciatica - Plan: DG Lumbar Spine Complete S: Patient dealing with primarily feet numbness. Several months of issues- forgot to mention last visit. The issue begins-->  If she lays on her left side both feet go numb. No back pain when laying down. Sometimes legs feel heavy with walking- no calf or quad pain with walking.   If she stands for long time will get some mild numbness on left side and some low back pain.   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 Also no issues with walking or sitting down. Providing pressure onto  left side seems to be main trigger.   Other orthopedic concer: Tuesday started back with water aerobics- had been a while. Woke up 3 Am next morning and couldn't walk due to right lateral hip/right buttocks pain. . States had been long time- didn't stretch and did a long time. She is feeling much better now.   A/P: given issues with low back pain over 3 months along with numbness- opted to get x-ray. I suspect she has osteoarthritis.  We discussed that she could have a bulging disc that is irritating the nerve root when she gets in certain positions.  Discussed option of prednisone-she does not want to do this but is just more interested in what is causing the pain.  In regards to her right lateral hip/right buttocks pain- may have pulled a muscle-grateful improving.  Continue to monitor   GERD S: Patient trial of Zantac but had significant breakthrough of reflux symptoms.  Omeprazole works far better from her and she restarted  A/P: Continue omeprazole given for control on over-the-counter Zantac  Hypertension S: controlled on  amlodipine 10mg  pills (often takes 5 mg),  Lisinopril-hctz  20-25mg - BP goal <150/90 due to orthostatic hypotension history BP Readings from Last 3 Encounters:  02/05/18 140/72  01/15/18 132/76  09/17/17 136/72  A/P: We discussed blood pressure goal of <140/90. Continue current meds   Future Appointments  Date Time Provider [place]  07/22/2018  8:45 AM Marin Olp, MD [facility]   No follow-ups on file.  Lab/Order associations: Chronic bilateral low back pain with bilateral sciatica - Plan: DG Lumbar Spine Complete  Moderate persistent asthma with acute exacerbation - Plan: budesonide-formoterol (SYMBICORT) 160-4.5 MCG/ACT inhaler  Return precautions advised.  [name], MD

## 2018-02-11 ENCOUNTER — Telehealth: Payer: Self-pay | Admitting: Family Medicine

## 2018-02-11 NOTE — Telephone Encounter (Signed)
Copied from [place] [id number]. Topic: Quick Communication - See Telephone Encounter >> Feb 11, 2018  4:34 PM Percell Belt A wrote: CRM for notification. See Telephone encounter for: 02/11/18. Pt called in and has some questions about her xray that taken last week.    Best number [phone]

## 2018-02-12 ENCOUNTER — Other Ambulatory Visit: Payer: Self-pay | Admitting: Family Medicine

## 2018-02-12 NOTE — Telephone Encounter (Signed)
Per the results of the xray from 02/06/18:  Notes recorded by Marin Olp, MD on 02/06/2018 at 8:20 AM EDT There is arthritis at multiple levels. This could certainly explain her symptoms. Could have a bulging disc associated with this. We could trial prednisone but this might raise your blood sugar and increase your weight. You can also see Dr. Paulla Fore of sports medicine for his opinion if you would like  Called patient and left a voicemail message asking for a return phone call

## 2018-02-13 ENCOUNTER — Other Ambulatory Visit: Payer: Self-pay

## 2018-02-13 DIAGNOSIS — M545 Low back pain: Secondary | ICD-10-CM

## 2018-02-13 NOTE — Telephone Encounter (Signed)
Spoke with patient yesterday to see how she wanted to proceed. I placed a referral to Sports Med for her. She is aware our office will call her to set this up

## 2018-02-21 ENCOUNTER — Other Ambulatory Visit: Payer: Self-pay | Admitting: Family Medicine

## 2018-02-24 DIAGNOSIS — H26492 Other secondary cataract, left eye: Secondary | ICD-10-CM | POA: Diagnosis not present

## 2018-03-03 ENCOUNTER — Ambulatory Visit: Payer: PPO | Admitting: Physician Assistant

## 2018-03-03 ENCOUNTER — Encounter: Payer: Self-pay | Admitting: Physician Assistant

## 2018-03-03 ENCOUNTER — Ambulatory Visit (INDEPENDENT_AMBULATORY_CARE_PROVIDER_SITE_OTHER): Payer: PPO | Admitting: Physician Assistant

## 2018-03-03 ENCOUNTER — Ambulatory Visit (INDEPENDENT_AMBULATORY_CARE_PROVIDER_SITE_OTHER): Payer: PPO | Admitting: Sports Medicine

## 2018-03-03 ENCOUNTER — Encounter: Payer: Self-pay | Admitting: Sports Medicine

## 2018-03-03 VITALS — BP 130/66 | HR 69 | Temp 98.1°F | Ht 63.0 in | Wt 174.0 lb

## 2018-03-03 VITALS — BP 130/72 | HR 73 | Ht 63.0 in | Wt 174.4 lb

## 2018-03-03 DIAGNOSIS — M9904 Segmental and somatic dysfunction of sacral region: Secondary | ICD-10-CM

## 2018-03-03 DIAGNOSIS — M9905 Segmental and somatic dysfunction of pelvic region: Secondary | ICD-10-CM | POA: Diagnosis not present

## 2018-03-03 DIAGNOSIS — M479 Spondylosis, unspecified: Secondary | ICD-10-CM | POA: Diagnosis not present

## 2018-03-03 DIAGNOSIS — J069 Acute upper respiratory infection, unspecified: Secondary | ICD-10-CM

## 2018-03-03 DIAGNOSIS — M9903 Segmental and somatic dysfunction of lumbar region: Secondary | ICD-10-CM | POA: Diagnosis not present

## 2018-03-03 DIAGNOSIS — M5442 Lumbago with sciatica, left side: Secondary | ICD-10-CM | POA: Diagnosis not present

## 2018-03-03 DIAGNOSIS — M5441 Lumbago with sciatica, right side: Secondary | ICD-10-CM

## 2018-03-03 DIAGNOSIS — G8929 Other chronic pain: Secondary | ICD-10-CM | POA: Diagnosis not present

## 2018-03-03 MED ORDER — AZITHROMYCIN 250 MG PO TABS: ORAL_TABLET | ORAL | 0 refills | Status: DC

## 2018-03-03 MED ORDER — BENZONATATE 100 MG PO CAPS: 100.0000 mg | ORAL_CAPSULE | Freq: Two times a day (BID) | ORAL | 0 refills | Status: DC | PRN

## 2018-03-03 MED ORDER — GABAPENTIN 100 MG PO CAPS: ORAL_CAPSULE | ORAL | 1 refills | Status: DC

## 2018-03-03 NOTE — Progress Notes (Signed)
Tammie [name]. [place], Tammie [name] at [facility] Tammie [name] - 83 y.o. female MRN [id number]  Date of birth: 09/22/1934  Visit Date: 03/03/2018  PCP: Marin Olp, MD   Referred by: Marin Olp, MD  Scribe(s) for today's visit: Josepha Pigg, CMA  SUBJECTIVE:  Tammie [name] is here for Initial Assessment (bilateral LBP w/ bilateral sciatica) .  Referred by: Dr. [name]  Her bilateral LBP w/ bilateral sciatica (L>R) symptoms INITIALLY: Began about 4 months ago and MOI is unknown. 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  Described as moderate aching, radiating to L leg and both feet.  Worsened with putting pressure on the L side, lying on the L side, standing for prolonged periods of time - these things seem to trigger n/t in LLE.  Improved with removing pressure from L side.  Additional associated symptoms include: She c/o heaviness in her legs when walking sometimes. She also reports R lateral hip/gluteal pain which began after a water aerobics class, this has improved over time. She does have tenderness to palpation in the left side of her lower back.     At this time symptoms show no change compared to onset. She has been . She has tried wearing compression stockings in the past with no relief. She tried wearing open toe sandals/shoes which seems to help with the numbness. She tried to be more aware of her posture and is using lower back support when sitting at work.   Xray l-spine done 02/05/2018.   REVIEW OF SYSTEMS: Reports night time disturbances. Denies fevers, chills, or night sweats. Denies unexplained weight loss. Reports personal history of colorectal cancer. Denies changes in bowel or bladder habits. Denies recent unreported falls. Reports new or worsening dyspnea or wheezing. Denies headaches or dizziness.  Reports numbness in LE. Denies  dizziness or presyncopal episodes Reports lower extremity edema    HISTORY & PERTINENT PRIOR DATA:  Prior History reviewed and updated per electronic medical record.  Significant/pertinent history, findings, studies include:  reports that she has never smoked. She has never used smokeless tobacco. Recent Labs    07/15/17 1135  HGBA1C 6.1   No specialty comments available. No problems updated.  OBJECTIVE:  VS:  HT:5\' 3"  (160 cm)   WT:174 lb 6.4 oz (79.1 kg)  BMI:30.9    BP:130/72  HR:73bpm  TEMP: ( )  RESP:97 %   PHYSICAL EXAM: CONSTITUTIONAL: Well-developed, Well-nourished and In no acute distress Alert & appropriately interactive. and Not depressed or anxious appearing. Respiratory: No increased work of breathing.  Trachea Midline EYES: Pupils are equal., EOM intact without nystagmus. and No scleral icterus.  Lower extremities: Warm and well perfused Edema: No significant swelling or edema NEURO: unremarkable, Normal associated myotomal distribution strength to manual muscle testing, Normal sensation to light touch  MSK Exam: Back is well aligned without significant deformity no neural tension signs.  Lower extremity strength and sensation is intact light touch and to manual muscle testing.  Good internal and external rotation bilateral hips.  Otherwise per osteopathic exam.  Marked anterior chain dominance.  PROCEDURES & DATA REVIEWED:  Per procedure notes  ASSESSMENT   1. Chronic bilateral low back pain with bilateral sciatica   2. Spondylosis   3. Somatic dysfunction of lumbar region   4. Somatic dysfunction of pelvis region   5. Somatic dysfunction of sacral region  PLAN:  Discussed the foundation of treatment for this condition is physical therapy and/or daily (5-6 days/week) therapeutic exercises, focusing on core strengthening, coordination, neuromuscular control/reeducation.  Therapeutic exercises prescribed per procedure note.  Osteopathic  manipulation was performed today based on physical exam findings.  Please see procedure note for further information including Osteopathic Exam findings  Functional axial back pain with possible slight radiculitis.  We will start low-dose gabapentin and see how she tolerates this.  Follow-up for repeat evaluation and consideration of further advanced diagnostic testing.   Meds ordered this encounter  Medications  . gabapentin (NEURONTIN) 100 MG capsule    Sig: Start with 1 tab po qhs X 1 week, then increase to 1 tab po bid X 1 week then 1 tab po tid prn    Dispense:  90 capsule    Refill:  1     Follow-up: Return in about 1 month (around 03/31/2018).      Please see additional documentation for Objective, Assessment and Plan sections. Pertinent additional documentation may be included in corresponding procedure notes, imaging studies, problem based documentation and patient instructions. Please see these sections of the encounter for additional information regarding this visit.  CMA/ATC served as Education administrator during this visit. History, Physical, and Plan performed by medical provider. Documentation and orders reviewed and attested to.      Gerda Diss, [place] [facility] Physician

## 2018-03-03 NOTE — Progress Notes (Signed)
Tammie [name] is a 83 y.o. female here for a new problem.  I acted as a Education administrator for [employer], PA-C Anselmo Pickler, LPN  History of Present Illness:   Chief Complaint  Patient presents with  . Sinus Problem    Sinus Problem  This is a new problem. Episode onset: Started about one week ago, pt was in [facility] 2 weeks ago. The problem has been gradually worsening since onset. There has been no fever. Her pain is at a severity of 3/10. The pain is mild. Associated symptoms include congestion, coughing (expectorating thick yellow sputum), headaches, a hoarse voice, sinus pressure and a sore throat. Pertinent negatives include no chills or shortness of breath. Treatments tried: Mucinex over the weekend. The treatment provided no relief.   Has a history of asthma, currently using Symbicort as scheduled. Is not using her albuterol inhaler, hasn't required it. Denies CP, SOB.    Past Medical History:  Diagnosis Date  . Abnormal uterine bleeding   . Allergic rhinitis   . Allergy    seasonal  . Anxiety    panic attacks  . Anxiety disorder   . Asthma   . Cataract    left eye surgery to removed  . Cataract    right eye  . Colorectal cancer ([place]) 1997   T3, N0  . Depression    pt states she isn't depressed anymore, continues paxil daily  . Endometriosis   . Fibroid   . GERD (gastroesophageal reflux disease)   . Hemorrhoids   . Hyperlipidemia    diet controlled, no meds  . Hypertension   . PONV (postoperative nausea and vomiting)   . Seasonal allergies   . SVD (spontaneous vaginal delivery)    x 3  . Thyroid disease   . Urinary incontinence      Social History   Socioeconomic History  . Marital status: Divorced    Spouse name: Not on file  . Number of children: Not on file  . Years of education: Not on file  . Highest education level: Not on file  Occupational History  . Not on file  Social Needs  . Financial resource strain: Not on file  . Food  insecurity:    Worry: Not on file    Inability: Not on file  . Transportation needs:    Medical: Not on file    Non-medical: Not on file  Tobacco Use  . Smoking status: Never Smoker  . Smokeless tobacco: Never Used  Substance and Sexual Activity  . Alcohol use: Yes    Alcohol/week: 0.0 oz    Comment: occasional wine  . Drug use: No  . Sexual activity: Never    Partners: Male    Birth control/protection: Post-menopausal  Lifestyle  . Physical activity:    Days per week: Not on file    Minutes per session: Not on file  . Stress: Not on file  Relationships  . Social connections:    Talks on phone: Not on file    Gets together: Not on file    Attends religious service: Not on file    Active member of club or organization: Not on file    Attends meetings of clubs or organizations: Not on file    Relationship status: Not on file  . Intimate partner violence:    Fear of current or ex partner: Not on file    Emotionally abused: Not on file    Physically abused: Not on file  Forced sexual activity: Not on file  Other Topics Concern  . Not on file  Social History Narrative   Divorced. Lives alone.  Two children. 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 cataract surgery Left   . COLECTOMY  1997   SIGMOID COLECTOMY 1997  . COLON SURGERY  1997  . COLONOSCOPY    . DILATATION & CURETTAGE/HYSTEROSCOPY WITH MYOSURE N/A 11/21/2015   Procedure: DILATATION & CURETTAGE/HYSTEROSCOPY WITH MYOSURE;  Surgeon: Salvadore Dom, MD;  Location: [facility];  Service: Gynecology;  Laterality: N/A;  . DILATION AND CURETTAGE OF UTERUS     D & C  . POLYPECTOMY    . TONSILLECTOMY      Family History  Problem Relation Age of Onset  . Sudden  death Brother 77       Apparent DVT/PE. all males  . Heart attack Brother   . Sudden death Cousin 71       Paternal cousin -dvt/pe 7 all males  . Coronary artery disease Cousin 86       Paternal cousin, MI  . Varicose Veins Mother   . Colon cancer Neg Hx   . Rectal cancer Neg Hx   . Stomach cancer Neg Hx   . Breast cancer Daughter 1  . Hypertension Father     Allergies  Allergen Reactions  . Erythromycin     unknown  . Fentanyl Hives  . Heparin     questionable, unknown  . Hydrocodone-Acetaminophen Hives  . Meperidine Hcl     unknown  . Penicillins     Has patient had a PCN reaction causing immediate rash, facial/tongue/throat swelling, SOB or lightheadedness with hypotension: no Has patient had a PCN reaction causing severe rash involving mucus membranes or skin necrosis: no Has patient had a PCN reaction that required hospitalization no Has patient had a PCN reaction occurring within the last 10 years: no If all of the above answers are "NO", then may proceed with Cephalosporin use.   . Procaine Hcl     Could not sleep  . Promethazine Hcl     unknown  . Temazepam Hives    unknown    Current Medications:   Current Outpatient Medications:  .  amLODipine (NORVASC) 5 MG tablet, TAKE 1 to 2 TABLETS BY MOUTH EVERY DAY depending ON blood pressure, Disp: 60 tablet, Rfl: 5 .  budesonide-formoterol (SYMBICORT) 160-4.5 MCG/ACT inhaler, INHALE 2 PUFFS INTO THE LUNGS 2 TIMES DAILY, Disp: 10.2 g, Rfl: 5 .  clobetasol (TEMOVATE) 0.05 % external solution, Apply 1 application topically 2 (two) times daily. For 7-10 days up to twice a day maximum. Use only on scalp, Disp: 50 mL, Rfl: 0 .  desloratadine (CLARINEX) 5 MG tablet, Take 1 tablet (5 mg total) by mouth daily., Disp: 30 tablet, Rfl: 5 .  gabapentin (NEURONTIN) 100 MG capsule, Start with 1 tab po qhs X 1 week, then increase to 1 tab po bid X 1 week then 1 tab po tid prn, Disp: 90 capsule, Rfl: 1 .  hydrocortisone (ANUSOL-HC)  2.5 % rectal cream, Place 1 application rectally 2 times daily as needed for hemorrhoids., Disp: 28.35 g, Rfl: 1 .  lisinopril-hydrochlorothiazide (PRINZIDE,ZESTORETIC) 20-25 MG tablet, TAKE  1 TABLET BY MOUTH EVERY DAY, Disp: 90 tablet, Rfl: 1 .  meclizine (ANTIVERT) 25 MG tablet, TAKE 1 TABLET BY MOUTH 2 TIMES DAILY AS NEEDED FOR vertigo OR dizziness., Disp: 30 tablet, Rfl: 0 .  omeprazole (PRILOSEC) 40 MG capsule, TAKE 1 CAPSULE BY MOUTH EVERY DAY, Disp: 90 capsule, Rfl: 1 .  PARoxetine (PAXIL) 20 MG tablet, TAKE 1 TABLET BY MOUTH EVERY DAY, Disp: 90 tablet, Rfl: 1 .  PROAIR HFA 108 (90 Base) MCG/ACT inhaler, Inhale 2 puffs into the lungs every 6 hours as needed for wheezing or shortness of breath., Disp: 8.5 g, Rfl: 2 .  azithromycin (ZITHROMAX) 250 MG tablet, Take 2 tablets on day 1, then one tablet daily x 4 days, Disp: 6 tablet, Rfl: 0 .  benzonatate (TESSALON) 100 MG capsule, Take 1 capsule (100 mg total) by mouth 2 (two) times daily as needed for cough., Disp: 20 capsule, Rfl: 0 .  fluticasone (FLONASE) 50 MCG/ACT nasal spray, PLACE 2 SPRAYS IN EACH NOSTRIL EVERY DAY (Patient not taking: Reported on 03/03/2018), Disp: 16 g, Rfl: 2 .  prednisoLONE acetate (PRED FORTE) 1 % ophthalmic suspension, INSTILL 1 DROP INTO THE LEFT EYE 4 TIMES A DAY, Disp: , Rfl: 0   Review of Systems:   Review of Systems  Constitutional: Negative for chills.  HENT: Positive for congestion, hoarse voice, sinus pressure and sore throat.   Respiratory: Positive for cough (expectorating thick yellow sputum). Negative for shortness of breath.   Neurological: Positive for headaches.    Vitals:   Vitals:   03/03/18 1432  BP: 130/66  Pulse: 69  Temp: 98.1 F (36.7 C)  TempSrc: Oral  SpO2: 97%  Weight: 174 lb (78.9 kg)  Height: 5\' 3"  (1.6 m)     Body mass index is 30.82 kg/m.  Physical Exam:   Physical Exam  Constitutional: She appears well-developed. She is cooperative.  Non-toxic appearance. She does  not have a sickly appearance. She does not appear ill. No distress.  HENT:  Head: Normocephalic and atraumatic.  Right Ear: Tympanic membrane, external ear and ear canal normal. Tympanic membrane is not erythematous, not retracted and not bulging.  Left Ear: Tympanic membrane, external ear and ear canal normal. Tympanic membrane is not erythematous, not retracted and not bulging.  Nose: Mucosal edema and rhinorrhea present. Right sinus exhibits no maxillary sinus tenderness and no frontal sinus tenderness. Left sinus exhibits no maxillary sinus tenderness and no frontal sinus tenderness.  Mouth/Throat: Uvula is midline and mucous membranes are normal. Posterior oropharyngeal erythema present. No posterior oropharyngeal edema. Tonsils are 1+ on the right. Tonsils are 1+ on the left. No tonsillar exudate.  Eyes: Conjunctivae and lids are normal.  Neck: Trachea normal.  Cardiovascular: Normal rate, regular rhythm, S1 normal, S2 normal and normal heart sounds.  Pulmonary/Chest: Effort normal and breath sounds normal. She has no decreased breath sounds. She has no wheezes. She has no rhonchi. She has no rales.  Lymphadenopathy:    She has cervical adenopathy.  Neurological: She is alert.  Skin: Skin is warm, dry and intact.  Psychiatric: She has a normal mood and affect. Her speech is normal and behavior is normal.  Nursing note and vitals reviewed.   Assessment and Plan:    Mairim was seen today for sinus problem.  Diagnoses and all orders for this visit:  Upper respiratory tract infection, unspecified type  Other orders -     benzonatate (TESSALON) 100 MG capsule; Take 1 capsule (100 mg  total) by mouth 2 (two) times daily as needed for cough. -     azithromycin (ZITHROMAX) 250 MG tablet; Take 2 tablets on day 1, then one tablet daily x 4 days   No red flags on exam.  Will initiate Azithromycin and Tessalon per orders. She has tolerated Azithromycin in the past, as recently as Jan 2019.  Does not like prednisone. Currently no wheezes or red flags on lung exam. Discussed taking medications as prescribed. Reviewed return precautions including worsening fever, SOB, worsening cough or other concerns. Push fluids and rest. I recommend that patient follow-up if symptoms worsen or persist despite treatment x 7-10 days, sooner if needed.  . Reviewed expectations re: course of current medical issues. . Discussed self-management of symptoms. . Outlined signs and symptoms indicating need for more acute intervention. . Patient verbalized understanding and all questions were answered. . See orders for this visit as documented in the electronic medical record. . Patient received an After-Visit Summary.  CMA or LPN served as scribe during this visit. History, Physical, and Plan performed by medical provider. Documentation and orders reviewed and attested to.   Inda Coke, PA-C

## 2018-03-03 NOTE — Patient Instructions (Signed)
It was great to see you!  Use medication as prescribed: Azithromycin (z-pack) and Tessalon perles  Push fluids and get plenty of rest. Please return if you are not improving as expected, or if you have high fevers (>101.5) or difficulty swallowing or worsening productive cough.  Call clinic with questions.  I hope you start feeling better soon!

## 2018-03-03 NOTE — Progress Notes (Signed)
PROCEDURE NOTE : OSTEOPATHIC MANIPULATION The decision today to treat with Osteopathic Manipulative Therapy (OMT) was based on physical exam findings. Verbal consent was obtained following a discussion with the patient regarding the of risks, benefits and potential side effects, including an acute pain flare,post manipulation soreness and need for repeat treatments.     Contraindications to OMT: NONE  Manipulation was performed as below: Regions treated: Lumbar spine, Pelvis and Sacrum OMT Techniques Used: HVLA, muscle energy and myofascial release  The patient tolerated the treatment well and reported Improved symptoms following treatment today. Patient was given medications, exercises, stretches and lifestyle modifications per AVS and verbally.   OSTEOPATHIC/STRUCTURAL EXAM:      L3 FRS right (Flexed, Rotated & Sidebent) Right psoas spasm Right anterior innonimate SACRUM: L on L sacral torsion

## 2018-03-03 NOTE — Patient Instructions (Addendum)
Please perform the exercise program that we have prepared for you and gone over in detail on a daily basis.  In addition to the handout you were provided you can access your program through: [email or web address]   Your unique program code is: [facility]

## 2018-03-03 NOTE — Progress Notes (Signed)

## 2018-03-05 ENCOUNTER — Other Ambulatory Visit: Payer: Self-pay | Admitting: Family Medicine

## 2018-03-12 DIAGNOSIS — Z87828 Personal history of other (healed) physical injury and trauma: Secondary | ICD-10-CM | POA: Diagnosis not present

## 2018-03-12 DIAGNOSIS — H2511 Age-related nuclear cataract, right eye: Secondary | ICD-10-CM | POA: Diagnosis not present

## 2018-03-12 DIAGNOSIS — Z48810 Encounter for surgical aftercare following surgery on the sense organs: Secondary | ICD-10-CM | POA: Diagnosis not present

## 2018-03-12 DIAGNOSIS — H26492 Other secondary cataract, left eye: Secondary | ICD-10-CM | POA: Diagnosis not present

## 2018-03-12 DIAGNOSIS — Z961 Presence of intraocular lens: Secondary | ICD-10-CM | POA: Diagnosis not present

## 2018-04-02 ENCOUNTER — Encounter: Payer: Self-pay | Admitting: Sports Medicine

## 2018-04-02 ENCOUNTER — Ambulatory Visit (INDEPENDENT_AMBULATORY_CARE_PROVIDER_SITE_OTHER): Payer: PPO | Admitting: Sports Medicine

## 2018-04-02 VITALS — BP 136/64 | HR 60 | Ht 63.0 in | Wt 175.2 lb

## 2018-04-02 DIAGNOSIS — M5441 Lumbago with sciatica, right side: Secondary | ICD-10-CM

## 2018-04-02 DIAGNOSIS — M5442 Lumbago with sciatica, left side: Secondary | ICD-10-CM | POA: Diagnosis not present

## 2018-04-02 DIAGNOSIS — M479 Spondylosis, unspecified: Secondary | ICD-10-CM | POA: Diagnosis not present

## 2018-04-02 DIAGNOSIS — G8929 Other chronic pain: Secondary | ICD-10-CM | POA: Diagnosis not present

## 2018-04-02 NOTE — Progress Notes (Signed)
Tammie [name]. [place], [facility]  [name] - 83 y.o. female MRN [id number]  Date of birth: 11-Oct-1934  Visit Date: 04/02/2018  PCP: Marin Olp, MD   Referred by: Marin Olp, MD  Scribe(s) for today's visit: Wendy Poet, LAT, ATC  SUBJECTIVE:  Tammie [name] is here for Follow-up (Chronic LBP) .    Her bilateral LBP w/ bilateral sciatica (L>R) symptoms INITIALLY: Began about 4 months ago and MOI is unknown. 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  Described as moderate aching, radiating to L leg and both feet.  Worsened with putting pressure on the L side, lying on the L side, standing for prolonged periods of time - these things seem to trigger n/t in LLE.  Improved with removing pressure from L side.  Additional associated symptoms include: She c/o heaviness in her legs when walking sometimes. She also reports R lateral hip/gluteal pain which began after a water aerobics class, this has improved over time. She does have tenderness to palpation in the left side of her lower back.     At this time symptoms show no change compared to onset. She has been . She has tried wearing compression stockings in the past with no relief. She tried wearing open toe sandals/shoes which seems to help with the numbness. She tried to be more aware of her posture and is using lower back support when sitting at work.   Xray l-spine done 02/05/2018.  04/02/2018: Compared to the last office visit on 03/03/18, her previously described low back painsymptoms show no change. Current symptoms are moderate aching pain & are radiating to L LE and B feet.  She continues to have N/T in B feet. She has not been taking Gabapentin and doing a HEP 1x/week consisting of pelvic tilts, LE rotation and piriformis stretch.  She states that she plans to resume her water aerobics this fall  once her grandkids go back to school.   REVIEW OF SYSTEMS: Reports night time disturbances. Denies fevers, chills, or night sweats. Denies unexplained weight loss. Reports personal history of cancer. Denies changes in bowel or bladder habits. Denies recent unreported falls. Reports new or worsening dyspnea or wheezing. Denies headaches or dizziness.  Reports numbness, tingling or weakness  In the extremities - B feet Denies dizziness or presyncopal episodes Reports lower extremity edema    HISTORY & PERTINENT PRIOR DATA:  Prior History reviewed and updated per electronic medical record.  Significant/pertinent history, findings, studies include:  reports that she has never smoked. She has never used smokeless tobacco. Recent Labs    07/15/17 1135  HGBA1C 6.1   No specialty comments available. No problems updated.  OBJECTIVE:  VS:  HT:_0  (160 cm)   WT:175 lb 3.2 oz (79.5 kg)  BMI:31.04    BP:136/64  HR:60bpm  TEMP: ( )  RESP:96 %   PHYSICAL EXAM: Constitutional: WDWN, Non-toxic appearing. Psychiatric: Alert & appropriately interactive.  Not depressed or anxious appearing. Respiratory: No increased work of breathing.  Trachea Midline Eyes: Pupils are equal.  EOM intact without nystagmus.  No scleral icterus  Vascular Exam: warm to touch no edema  lower extremity neuro exam: unremarkable  MSK Exam: Bilateral hips move with good range of motion and internal and external rotation.  Negative straight leg raise bilaterally.  She has some pain over the greater trochanter  and IT band on the left greater than right but is present bilaterally.  She walks with a slight antalgic wide-based gait but otherwise her strength is normal.   ASSESSMENT & PLAN:   1. Chronic bilateral low back pain with bilateral sciatica   2. Spondylosis     PLAN:>50% of this 25 minute visit spent in direct patient counseling and/or coordination of care.  Discussion was focused on education  regarding the in discussing the pathoetiology and anticipated clinical course of the above condition.  Ultimately we discussed that deconditioning is the majority of her symptoms with likely some underlying spinal stenosis.  She will benefit from a formal exercise program and new exercises were provided for her per AVS including [employer].  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  I encouraged her to continue to remain as active as possible but avoiding pounding and explosive type movements.  Okay to work on slow rotational movements such as with tai chi movements and with Archie Balboa exercises.  We will plan to check in with her in 8 weeks and check on her progress at that point.  In the interim she is planning on returning to the pool to work on [employer] and other physical activities that she is previously enjoyed.  If any exacerbation of her symptoms she will start gabapentin previously prescribed but at this time would like to hold off.  Can consider formal physical therapy if needed.  Follow-up: Return in about 8 weeks (around 05/28/2018).       Please see additional documentation for Objective, Assessment and Plan sections. Pertinent additional documentation may be included in corresponding procedure notes, imaging studies, problem based documentation and patient instructions. Please see these sections of the encounter for additional information regarding this visit.  CMA/ATC served as Education administrator during this visit. History, Physical, and Plan performed by medical provider. Documentation and orders reviewed and attested to.      Gerda Diss, [place] [facility] Physician

## 2018-04-02 NOTE — Patient Instructions (Signed)

## 2018-04-28 ENCOUNTER — Other Ambulatory Visit: Payer: Self-pay | Admitting: Family Medicine

## 2018-04-28 DIAGNOSIS — H26492 Other secondary cataract, left eye: Secondary | ICD-10-CM | POA: Diagnosis not present

## 2018-04-28 DIAGNOSIS — H2511 Age-related nuclear cataract, right eye: Secondary | ICD-10-CM | POA: Diagnosis not present

## 2018-04-28 DIAGNOSIS — Z48810 Encounter for surgical aftercare following surgery on the sense organs: Secondary | ICD-10-CM | POA: Diagnosis not present

## 2018-04-28 DIAGNOSIS — R7303 Prediabetes: Secondary | ICD-10-CM | POA: Diagnosis not present

## 2018-04-28 DIAGNOSIS — H31092 Other chorioretinal scars, left eye: Secondary | ICD-10-CM | POA: Diagnosis not present

## 2018-04-28 DIAGNOSIS — H40003 Preglaucoma, unspecified, bilateral: Secondary | ICD-10-CM | POA: Diagnosis not present

## 2018-04-28 DIAGNOSIS — Z961 Presence of intraocular lens: Secondary | ICD-10-CM | POA: Diagnosis not present

## 2018-04-28 DIAGNOSIS — I10 Essential (primary) hypertension: Secondary | ICD-10-CM | POA: Diagnosis not present

## 2018-04-28 DIAGNOSIS — H3562 Retinal hemorrhage, left eye: Secondary | ICD-10-CM | POA: Diagnosis not present

## 2018-05-03 ENCOUNTER — Encounter: Payer: Self-pay | Admitting: Sports Medicine

## 2018-05-12 ENCOUNTER — Ambulatory Visit: Payer: PPO

## 2018-05-28 ENCOUNTER — Ambulatory Visit: Payer: PPO | Admitting: Sports Medicine

## 2018-06-05 ENCOUNTER — Other Ambulatory Visit: Payer: Self-pay | Admitting: Family Medicine

## 2018-06-10 ENCOUNTER — Ambulatory Visit (INDEPENDENT_AMBULATORY_CARE_PROVIDER_SITE_OTHER): Payer: PPO

## 2018-06-10 VITALS — BP 140/90 | HR 51 | Ht 63.0 in | Wt 178.6 lb

## 2018-06-10 DIAGNOSIS — Z0000 Encounter for general adult medical examination without abnormal findings: Secondary | ICD-10-CM

## 2018-06-10 NOTE — Patient Instructions (Signed)
There are no preventive care reminders to display for this patient.   Tammie [name] , Thank you for taking time to come for your Medicare Wellness Visit. I appreciate your ongoing commitment to your health goals. Please review the following plan we discussed and let me know if I can assist you in the future.   These are the goals we discussed: Goals    . Weight (lb) < 200 lb (90.7 kg)     Wants to be more physically active and lose some weight       This is a list of the screening recommended for you and due dates:  Health Maintenance  Topic Date Due  . Tetanus Vaccine  08/25/2029*  . Flu Shot  08/07/2035*  . DEXA scan (bone density measurement)  Completed  . Pneumonia vaccines  Completed  *Topic was postponed. The date shown is not the original due date.    Preventive Care for Adults  A healthy lifestyle and preventive care can promote health and wellness. Preventive health guidelines for adults include the following key practices.  . A routine yearly physical is a good way to check with your health care provider about your health and preventive screening. It is a chance to share any concerns and updates on your health and to receive a thorough exam.  . Visit your dentist for a routine exam and preventive care every 6 months. Brush your teeth twice a day and floss once a day. Good oral hygiene prevents tooth decay and gum disease.  . The frequency of eye exams is based on your age, health, family medical history, use  of contact lenses, and other factors. Follow your health care provider's recommendations for frequency of eye exams.  . Eat a healthy diet. Foods like vegetables, fruits, whole grains, low-fat dairy products, and lean protein foods contain the nutrients you need without too many calories. Decrease your intake of foods high in solid fats, added sugars, and salt. Eat the right amount of calories for you. Get information about a proper diet from your health care provider,  if necessary.  . Regular physical exercise is one of the most important things you can do for your health. Most adults should get at least 150 minutes of moderate-intensity exercise (any activity that increases your heart rate and causes you to sweat) each week. In addition, most adults need muscle-strengthening exercises on 2 or more days a week.  Silver Sneakers may be a benefit available to you. To determine eligibility, you may visit the website: www.silversneakers.com or contact program at [phone] Mon-Fri between 8AM-8PM.   . Maintain a healthy weight. The body mass index (BMI) is a screening tool to identify possible weight problems. It provides an estimate of body fat based on height and weight. Your health care provider can find your BMI and can help you achieve or maintain a healthy weight.   For adults 20 years and older: ? A BMI below 18.5 is considered underweight. ? A BMI of 18.5 to 24.9 is normal. ? A BMI of 25 to 29.9 is considered overweight. ? A BMI of 30 and above is considered obese.   . Maintain normal blood lipids and cholesterol levels by exercising and minimizing your intake of saturated fat. Eat a balanced diet with plenty of fruit and vegetables. Blood tests for lipids and cholesterol should begin at age 50 and be repeated every 5 years. If your lipid or cholesterol levels are high, you are over 50, or you  are at high risk for heart disease, you may need your cholesterol levels checked more frequently. Ongoing high lipid and cholesterol levels should be treated with medicines if diet and exercise are not working.  . If you smoke, find out from your health care provider how to quit. If you do not use tobacco, please do not start.  . If you choose to drink alcohol, please do not consume more than 2 drinks per day. One drink is considered to be 12 ounces (355 mL) of beer, 5 ounces (148 mL) of wine, or 1.5 ounces (44 mL) of liquor.  . If you are 44-42 years old, ask  your health care provider if you should take aspirin to prevent strokes.  . Use sunscreen. Apply sunscreen liberally and repeatedly throughout the day. You should seek shade when your shadow is shorter than you. Protect yourself by wearing long sleeves, pants, a wide-brimmed hat, and sunglasses year round, whenever you are outdoors.  . Once a month, do a whole body skin exam, using a mirror to look at the skin on your back. Tell your health care provider of new moles, moles that have irregular borders, moles that are larger than a pencil eraser, or moles that have changed in shape or color.

## 2018-06-10 NOTE — Progress Notes (Signed)
Subjective:   Tammie [name] is a 83 y.o. female who presents for Medicare Annual (Subsequent) preventive examination.  Review of Systems:  No ROS.  Medicare Wellness Visit. Additional risk factors are reflected in the social history. Cardiac Risk Factors include: advanced age (>18men, >83 women) Patient lives in a ground floor apartment by herself. No pets. Has a daughter that lives here. She works 2 part time jobs. 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 She is active with friends.  She goes to bed around 11:30pm. She gets up 2-3 times a night to go to the bathroom. She gets up around 8:30-9am. Feels rested when she gets up.     Objective:     Vitals: BP 140/90 (BP Location: Left Arm, Patient Position: Sitting, Cuff Size: Large)   Pulse (!) 51   Ht 5\' 3"  (1.6 m)   Wt 178 lb 9.6 oz (81 kg)   SpO2 92%   BMI 31.64 kg/m   Body mass index is 31.64 kg/m.  Advanced Directives 06/10/2018 11/21/2015 11/20/2015 08/04/2014  Does Patient Have a Medical Advance Directive? Yes Yes Yes Yes  Type of Paramedic of [place];Living will - [place];Living will [place];Living will  Does patient want to make changes to medical advance directive? No - Patient declined - - -  Copy of [place] in Chart? No - copy requested - No - copy requested -    Tobacco Social History   Tobacco Use  Smoking Status Never Smoker  Smokeless Tobacco Never Used     Counseling given: Not Answered       Past Medical History:  Diagnosis Date  . Abnormal uterine bleeding   . Allergic rhinitis   . Allergy    seasonal  . Anxiety    panic attacks  . Anxiety disorder   . Asthma   . Cataract    left eye surgery to removed  . Cataract    right eye  . Colorectal cancer ([place]) 1997   T3, N0  . Depression    pt states she isn't depressed anymore, continues paxil daily  .  Endometriosis   . Fibroid   . GERD (gastroesophageal reflux disease)   . Hemorrhoids   . Hyperlipidemia    diet controlled, no meds  . Hypertension   . PONV (postoperative nausea and vomiting)   . Seasonal allergies   . SVD (spontaneous vaginal delivery)    x 3  . Thyroid disease   . Urinary incontinence    Past Surgical History:  Procedure Laterality Date  . cataract surgery Left   . COLECTOMY  1997   SIGMOID COLECTOMY 1997  . COLON SURGERY  1997  . COLONOSCOPY    . DILATATION & CURETTAGE/HYSTEROSCOPY WITH MYOSURE N/A 11/21/2015   Procedure: DILATATION & CURETTAGE/HYSTEROSCOPY WITH MYOSURE;  Surgeon: Salvadore Dom, MD;  Location: [facility];  Service: Gynecology;  Laterality: N/A;  . DILATION AND CURETTAGE OF UTERUS     D & C  . POLYPECTOMY    . TONSILLECTOMY     Family History  Problem Relation Age of Onset  . Sudden death Brother 73       Apparent DVT/PE. all males  . Heart attack Brother   . Varicose Veins Mother   . Hypertension Father   . Sudden death Cousin 60       Paternal cousin -dvt/pe 7  all males  . Coronary artery disease Cousin 62       Paternal cousin, MI  . Breast cancer Daughter 70  . Stroke Maternal Grandmother   . Stroke Maternal Grandfather   . Early death Paternal Grandmother   . Tuberculosis Paternal Grandmother   . Early death Paternal Grandfather   . Tuberculosis Paternal Grandfather   . Hypertension Son   . Colon cancer Neg Hx   . Rectal cancer Neg Hx   . Stomach cancer Neg Hx    Social History   Socioeconomic History  . Marital status: Divorced    Spouse name: Not on file  . Number of children: Not on file  . Years of education: Not on file  . Highest education level: Not on file  Occupational History  . Not on file  Social Needs  . Financial resource strain: Not on file  . Food insecurity:    Worry: Not on file    Inability: Not on file  . Transportation needs:    Medical: Not on file    Non-medical: Not on file  Tobacco  Use  . Smoking status: Never Smoker  . Smokeless tobacco: Never Used  Substance and Sexual Activity  . Alcohol use: Yes    Alcohol/week: 0.0 standard drinks    Comment: occasional wine  . Drug use: No  . Sexual activity: Never    Partners: Male    Birth control/protection: Post-menopausal  Lifestyle  . Physical activity:    Days per week: Not on file    Minutes per session: Not on file  . Stress: Not on file  Relationships  . Social connections:    Talks on phone: Not on file    Gets together: Not on file    Attends religious service: Not on file    Active member of club or organization: Not on file    Attends meetings of clubs or organizations: Not on file    Relationship status: Not on file  Other Topics Concern  . Not on file  Social History Narrative   Divorced. Lives alone.  Two children. 2 granddaughters (70 and 81 years old in 2016)   Family of 7 including ex-husband      Shows houses (Artist- Radiographer, therapeutic and rental properties)- shows rentals      Hobbies: painting- enjoys classes, writing, photography, sewing    Outpatient Encounter Medications as of 06/10/2018  Medication Sig  . amLODipine (NORVASC) 5 MG tablet TAKE 1 to 2 TABLETS BY MOUTH EVERY DAY depending ON blood pressure  . budesonide-formoterol (SYMBICORT) 160-4.5 MCG/ACT inhaler INHALE 2 PUFFS INTO THE LUNGS 2 TIMES DAILY  . clobetasol (TEMOVATE) 0.05 % external solution Apply 1 application topically 2 (two) times daily. For 7-10 days up to twice a day maximum. Use only on scalp  . desloratadine (CLARINEX) 5 MG tablet TAKE 1 TABLET BY MOUTH EVERY DAY  . fluticasone (FLONASE) 50 MCG/ACT nasal spray PLACE 2 SPRAYS IN EACH NOSTRIL EVERY DAY  . gabapentin (NEURONTIN) 100 MG capsule Start with 1 tab po qhs X 1 week, then increase to 1 tab po bid X 1 week then 1 tab po tid prn  . hydrocortisone (ANUSOL-HC) 2.5 % rectal cream Place 1 application rectally 2 times daily as needed for hemorrhoids.  [name]  lisinopril-hydrochlorothiazide (PRINZIDE,ZESTORETIC) 20-25 MG tablet TAKE 1 TABLET BY MOUTH EVERY DAY  . meclizine (ANTIVERT) 25 MG tablet TAKE 1 TABLET BY MOUTH 2 TIMES DAILY AS NEEDED FOR vertigo OR  dizziness.  [name] omeprazole (PRILOSEC) 40 MG capsule TAKE 1 CAPSULE BY MOUTH EVERY DAY  . PARoxetine (PAXIL) 20 MG tablet TAKE 1 TABLET BY MOUTH EVERY DAY  . prednisoLONE acetate (PRED FORTE) 1 % ophthalmic suspension INSTILL 1 DROP INTO THE LEFT EYE 4 TIMES A DAY  . PROAIR HFA 108 (90 Base) MCG/ACT inhaler Inhale 2 puffs into the lungs every 6 hours as needed for wheezing or shortness of breath.  . [DISCONTINUED] azithromycin (ZITHROMAX) 250 MG tablet Take 2 tablets on day 1, then one tablet daily x 4 days  . [DISCONTINUED] benzonatate (TESSALON) 100 MG capsule Take 1 capsule (100 mg total) by mouth 2 (two) times daily as needed for cough.   No facility-administered encounter medications on file as of 06/10/2018.     Activities of Daily Living In your present state of health, do you have any difficulty performing the following activities: 06/10/2018  Hearing? N  Vision? N  Difficulty concentrating or making decisions? N  Walking or climbing stairs? N  Doing errands, shopping? N  Preparing Food and eating ? N  Using the Toilet? N  In the past six months, have you accidently leaked urine? N  Do you have problems with loss of bowel control? N  Managing your Medications? N  Managing your Finances? N  Housekeeping or managing your Housekeeping? N  Some recent data might be hidden    Patient Care Team: Marin Olp, MD as PCP - General (Family Medicine)    Assessment:   This is a routine wellness examination for [place].  Exercise Activities and Dietary recommendations Current Exercise Habits: Structured exercise class, Type of exercise: Other - see comments(Water aerobics), Time (Minutes): 30, Frequency (Times/Week): 1, Weekly Exercise (Minutes/Week): 30, Intensity: Moderate   Breakfast:  Coffee, oatmeal with walnuts and milk and sugar, bowl of cereal, bagels with honey  Lunch: omelette with Sweet tea  Dinner: Butter Beans, Country ham, tomatoes with sweet tea to drink Goals    . Weight (lb) < 200 lb (90.7 kg)     Wants to be more physically active and lose some weight       Fall Risk Fall Risk  06/10/2018 07/15/2017 09/04/2015 08/09/2014 12/27/2013  Falls in the past year? No No No No No    Depression Screen PHQ 2/9 Scores 06/10/2018 07/15/2017 09/04/2015 08/09/2014  PHQ - 2 Score 0 1 0 0     Cognitive Function        Immunization History  Administered Date(s) Administered  . Pneumococcal Conjugate-13 08/09/2014  . Pneumococcal Polysaccharide-23 10/19/2015      Screening Tests Health Maintenance  Topic Date Due  . TETANUS/TDAP  08/25/2029 (Originally 09/08/1954)  . INFLUENZA VACCINE  08/07/2035 (Originally 04/16/2018)  . DEXA SCAN  Completed  . PNA vac Low Risk Adult  Completed         Plan:    Follow Up with PCP as advised   I have personally reviewed and noted the following in the patient's chart:   . Medical and social history . Use of alcohol, tobacco or illicit drugs  . Current medications and supplements . Functional ability and status . Nutritional status . Physical activity . Advanced directives . List of other physicians . Vitals . Screenings to include cognitive, depression, and falls . Referrals and appointments  In addition, I have reviewed and discussed with patient certain preventive protocols, quality metrics, and best practice recommendations. A written personalized care plan for preventive services as well as general  preventive health recommendations were provided to patient.     [place], [place]  [id number]

## 2018-06-10 NOTE — Progress Notes (Signed)
PCP notes: 02/05/2018   Health maintenance: Up to Date   Abnormal screenings: None   Patient concerns: Has a dry cough that is continuous. She says it is coming from the Lisinopril and has been discussed with Dr. Yong Channel previously. If it gets bad enough she will talk to him about switching.   Nurse concerns:None   Next PCP appt: 07/22/2018

## 2018-06-11 NOTE — Progress Notes (Signed)
I have personally reviewed the Medicare Annual Wellness questionnaire and have noted 1. The patient's medical and social history 2. Their use of alcohol, tobacco or illicit drugs 3. Their current medications and supplements 4. The patient's functional ability including ADL's, fall risks, home safety risks and hearing or visual impairment. 5. Diet and physical activities 6. Evidence for depression or mood disorders 7. Reviewed Updated provider list, see scanned forms and [facility] Snapshot.   The patients weight, height, BMI and visual acuity have been recorded in the chart I have made referrals, counseling and provided education to the patient based review of the above and I have provided the pt with a written personalized care plan for preventive services.  I have provided the patient with a copy of your personalized plan for preventive services. Instructed to take the time to review along with their updated medication list.  Jaimie Redditt DO 

## 2018-07-22 ENCOUNTER — Ambulatory Visit (INDEPENDENT_AMBULATORY_CARE_PROVIDER_SITE_OTHER): Payer: PPO | Admitting: Family Medicine

## 2018-07-22 ENCOUNTER — Encounter: Payer: Self-pay | Admitting: Family Medicine

## 2018-07-22 VITALS — BP 122/76 | HR 50 | Temp 97.6°F | Ht 63.0 in | Wt 177.0 lb

## 2018-07-22 DIAGNOSIS — E785 Hyperlipidemia, unspecified: Secondary | ICD-10-CM | POA: Diagnosis not present

## 2018-07-22 DIAGNOSIS — J4540 Moderate persistent asthma, uncomplicated: Secondary | ICD-10-CM | POA: Diagnosis not present

## 2018-07-22 DIAGNOSIS — M85851 Other specified disorders of bone density and structure, right thigh: Secondary | ICD-10-CM | POA: Diagnosis not present

## 2018-07-22 DIAGNOSIS — Z0000 Encounter for general adult medical examination without abnormal findings: Secondary | ICD-10-CM | POA: Diagnosis not present

## 2018-07-22 DIAGNOSIS — R739 Hyperglycemia, unspecified: Secondary | ICD-10-CM

## 2018-07-22 LAB — COMPREHENSIVE METABOLIC PANEL
ALBUMIN: 4.3 g/dL (ref 3.5–5.2)
ALT: 19 U/L (ref 0–35)
AST: 26 U/L (ref 0–37)
Alkaline Phosphatase: 67 U/L (ref 39–117)
BUN: 12 mg/dL (ref 6–23)
CALCIUM: 10.5 mg/dL (ref 8.4–10.5)
CHLORIDE: 98 meq/L (ref 96–112)
CO2: 33 mEq/L — ABNORMAL HIGH (ref 19–32)
Creatinine, Ser: 0.69 mg/dL (ref 0.40–1.20)
GFR: 86.82 mL/min (ref >60.00)
Glucose, Bld: 177 mg/dL — ABNORMAL HIGH (ref 70–99)
POTASSIUM: 3.7 meq/L (ref 3.5–5.1)
Sodium: 138 mEq/L (ref 135–145)
Total Bilirubin: 1 mg/dL (ref 0.2–1.2)
Total Protein: 7.4 g/dL (ref 6.0–8.3)

## 2018-07-22 LAB — POC URINALSYSI DIPSTICK (AUTOMATED)
BILIRUBIN UA: NEGATIVE
Blood, UA: NEGATIVE
GLUCOSE UA: NEGATIVE
Ketones, UA: NEGATIVE
LEUKOCYTES UA: NEGATIVE
NITRITE UA: NEGATIVE
PH UA: 8 (ref 5.0–8.0)
Protein, UA: NEGATIVE
Spec Grav, UA: 1.01 (ref 1.010–1.025)
Urobilinogen, UA: 0.2 E.U./dL

## 2018-07-22 LAB — CBC
HEMATOCRIT: 42 % (ref 36.0–46.0)
Hemoglobin: 15.1 g/dL — ABNORMAL HIGH (ref 12.0–15.0)
MCHC: 35.9 g/dL (ref 30.0–36.0)
MCV: 91.5 fl (ref 78.0–100.0)
PLATELETS: 258 10*3/uL (ref 150.0–400.0)
RBC: 4.59 Mil/uL (ref 3.87–5.11)
RDW: 13.2 % (ref 11.5–15.5)
WBC: 7.1 10*3/uL (ref 4.0–10.5)

## 2018-07-22 LAB — LIPID PANEL
CHOLESTEROL: 168 mg/dL (ref 0–200)
HDL: 31.2 mg/dL — AB (ref >39.00)
LDL CALC: 105 mg/dL — AB (ref 0–99)
NonHDL: 136.74
TRIGLYCERIDES: 161 mg/dL — AB (ref 0.0–149.0)
Total CHOL/HDL Ratio: 5
VLDL: 32.2 mg/dL (ref 0.0–40.0)

## 2018-07-22 LAB — HEMOGLOBIN A1C: Hgb A1c MFr Bld: 6.8 % — ABNORMAL HIGH (ref 4.6–6.5)

## 2018-07-22 LAB — TSH: TSH: 5.28 u[IU]/mL — ABNORMAL HIGH (ref 0.35–4.50)

## 2018-07-22 NOTE — Progress Notes (Signed)
Phone: [phone]  Subjective:  Patient presents today for their annual physical. Chief complaint-noted.   See problem oriented charting- ROS- full  review of systems was completed and negative except for: some fatigue, post nasal drip, mild sinus pressure, film over yee- seeing optho, cough, wheezing, SOB at times- has not trie dalbuterol, increased urination, back pain, sparing vertigo, sleep disturbance- wakes up to pee, seasonal allergies  The following were reviewed and entered/updated in epic: Past Medical History:  Diagnosis Date  . Abnormal uterine bleeding   . Allergic rhinitis   . Allergy    seasonal  . Anxiety    panic attacks  . Anxiety disorder   . Asthma   . Cataract    left eye surgery to removed  . Cataract    right eye  . Colorectal cancer ([place]) 1997   T3, N0  . Depression    pt states she isn't depressed anymore, continues paxil daily  . Endometriosis   . Fibroid   . GERD (gastroesophageal reflux disease)   . Hemorrhoids   . Hyperlipidemia    diet controlled, no meds  . Hypertension   . PONV (postoperative nausea and vomiting)   . Seasonal allergies   . SVD (spontaneous vaginal delivery)    x 3  . Thyroid disease   . Urinary incontinence    Patient Active Problem List   Diagnosis Date Noted  . Hyperglycemia 07/15/2017    Priority: High  . Osteopenia 10/02/2015    Priority: High  . Hyperlipidemia 01/15/2018    Priority: Medium  . Vertigo 07/15/2017    Priority: Medium  . Hypertension     Priority: Medium  . Panic attacks     Priority: Medium  . History of colorectal cancer     Priority: Medium  . Asthma     Priority: Medium  . Left leg pain 12/20/2016    Priority: Low  . Hemorrhoids 12/20/2016    Priority: Low  . Dermatitis 12/20/2016    Priority: Low  . Systolic murmur 05/39/7673    Priority: Low  . Allergic rhinitis     Priority: Low  . GERD 06/22/2009    Priority: Low   Past Surgical History:  Procedure Laterality Date    . cataract surgery Left   . COLECTOMY  1997   SIGMOID COLECTOMY 1997  . COLON SURGERY  1997  . COLONOSCOPY    . DILATATION & CURETTAGE/HYSTEROSCOPY WITH MYOSURE N/A 11/21/2015   Procedure: DILATATION & CURETTAGE/HYSTEROSCOPY WITH MYOSURE;  Surgeon: Salvadore Dom, MD;  Location: [facility];  Service: Gynecology;  Laterality: N/A;  . DILATION AND CURETTAGE OF UTERUS     D & C  . POLYPECTOMY    . TONSILLECTOMY      Family History  Problem Relation Age of Onset  . Sudden death Brother 44       Apparent DVT/PE. all males  . Heart attack Brother   . Varicose Veins Mother   . Hypertension Father   . Sudden death Cousin 63       Paternal cousin -dvt/pe 7 all males  . Coronary artery disease Cousin 60       Paternal cousin, MI  . Breast cancer Daughter 10  . Stroke Maternal Grandmother   . Stroke Maternal Grandfather   . Early death Paternal Grandmother   . Tuberculosis Paternal Grandmother   . Early death Paternal Grandfather   . Tuberculosis Paternal Grandfather   . Hypertension Son   . Colon cancer  Neg Hx   . Rectal cancer Neg Hx   . Stomach cancer Neg Hx     Medications- reviewed and updated Current Outpatient Medications  Medication Sig Dispense Refill  . amLODipine (NORVASC) 5 MG tablet TAKE 1 to 2 TABLETS BY MOUTH EVERY DAY depending ON blood pressure 60 tablet 5  . budesonide-formoterol (SYMBICORT) 160-4.5 MCG/ACT inhaler INHALE 2 PUFFS INTO THE LUNGS 2 TIMES DAILY 10.2 g 5  . clobetasol (TEMOVATE) 0.05 % external solution Apply 1 application topically 2 (two) times daily. For 7-10 days up to twice a day maximum. Use only on scalp 50 mL 0  . desloratadine (CLARINEX) 5 MG tablet TAKE 1 TABLET BY MOUTH EVERY DAY 30 tablet 5  . fluticasone (FLONASE) 50 MCG/ACT nasal spray PLACE 2 SPRAYS IN EACH NOSTRIL EVERY DAY 16 g 2  . gabapentin (NEURONTIN) 100 MG capsule Start with 1 tab po qhs X 1 week, then increase to 1 tab po bid X 1 week then 1 tab po tid prn 90 capsule 1  .  hydrocortisone (ANUSOL-HC) 2.5 % rectal cream Place 1 application rectally 2 times daily as needed for hemorrhoids. 28.35 g 1  . lisinopril-hydrochlorothiazide (PRINZIDE,ZESTORETIC) 20-25 MG tablet TAKE 1 TABLET BY MOUTH EVERY DAY 90 tablet 1  . meclizine (ANTIVERT) 25 MG tablet TAKE 1 TABLET BY MOUTH 2 TIMES DAILY AS NEEDED FOR vertigo OR dizziness. 30 tablet 0  . omeprazole (PRILOSEC) 40 MG capsule TAKE 1 CAPSULE BY MOUTH EVERY DAY 90 capsule 1  . PARoxetine (PAXIL) 20 MG tablet TAKE 1 TABLET BY MOUTH EVERY DAY 90 tablet 1  . prednisoLONE acetate (PRED FORTE) 1 % ophthalmic suspension INSTILL 1 DROP INTO THE LEFT EYE 4 TIMES A DAY  0  . PROAIR HFA 108 (90 Base) MCG/ACT inhaler Inhale 2 puffs into the lungs every 6 hours as needed for wheezing or shortness of breath. 8.5 g 2   No current facility-administered medications for this visit.     Allergies-reviewed and updated Allergies  Allergen Reactions  . Erythromycin     unknown  . Fentanyl Hives  . Heparin     questionable, unknown  . Hydrocodone-Acetaminophen Hives  . Meperidine Hcl     unknown  . Penicillins     Has patient had a PCN reaction causing immediate rash, facial/tongue/throat swelling, SOB or lightheadedness with hypotension: no Has patient had a PCN reaction causing severe rash involving mucus membranes or skin necrosis: no Has patient had a PCN reaction that required hospitalization no Has patient had a PCN reaction occurring within the last 10 years: no If all of the above answers are "NO", then may proceed with Cephalosporin use.   . Procaine Hcl     Could not sleep  . Promethazine Hcl     unknown  . Temazepam Hives    unknown    Social History   Social History Narrative   Divorced. Lives alone.  Two children. 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 Oropharynx normal Neck: no thyromegaly CV: RRR no murmurs rubs or gallops Lungs: CTAB no crackles, wheeze, rhonchi Abdomen: soft/nontender/nondistended/normal bowel sounds. No rebound or guarding.  Ext: no edema Skin: warm, dry Neuro: grossly normal, moves all extremities, PERRLA  Assessment/Plan:  83 y.o. female presenting for annual physical.  Health Maintenance counseling: 1. Anticipatory guidance: Patient counseled regarding regular dental exams -q6 months, eye exams - follows at [facility] due to history cataracts- working with them due to film on eye,  avoiding smoking and second hand smoke, limiting alcohol to 1 beverage per day- rare wine .   2. Risk factor reduction:  Advised patient of need for regular exercise and diet rich and fruits and vegetables to reduce risk of heart attack and stroke. Exercise-encouraged her to restart water aerobics last year- encouraged her again this year- she set a goal once a week. Diet-plan in May was at least 5 pound weight loss- she is down 0.6 lbs.  Wt Readings from Last 3 Encounters:  07/22/18 177 lb (80.3 kg)  06/10/18 178 lb 9.6 oz (81 kg)  04/02/18 175 lb 3.2 oz (79.5 kg)  3. Immunizations/screenings/ancillary studies-declines flu shot and Shingrix. Discussed Tdap and Shingrix at pharmacy Immunization History  Administered Date(s) Administered  . Pneumococcal Conjugate-13 08/09/2014  . Pneumococcal Polysaccharide-23 10/19/2015  4. Cervical cancer screening-passed age to be screening.  Has seen Dr. Talbert Nan and had polypectomy as recently as 2017.  No history of abnormal Pap smear within 15 to 20 years. 5. Breast cancer  screening-  breast exam declined and mammogram-past age based screening 6. Colon cancer screening -09/29/2014 with no repeat due to age per [place] with history of colorectal cancer cancer noted 1997- per Dr. Fuller Plan  7. Skin cancer screening-was planning to get plugged back into dermatology last year- has seen within the year. advised regular sunscreen use. Denies worrisome, changing, or new skin lesions.  8.  Osteoporosis screening at 80- 09/27/2015 noted to have osteopenia.  We will repeat bone density at this time. She was in range to start fosamax but she declined at that time- if worsening will encourage again to restart 9.-Never smoker  Status of chronic or acute concerns   B12 normal last year-consider rechecking next year with long-term PPI use  Scalp dermatitis-dermatology follow-up- does ok as long as she doesn't pick at it. About to get an expensive medicine that helps.   Insulin resistance/hyperglycemia-update A1c.  Continue to work on regular exercise and healthy eating  Panic attacks- she is compliant with Paxil 20 mg. She reports only taking half tablet  Hypertension- patient tends to be orthostatic with blood pressure under 140 so we are using JNC 8 goal of 150/90.  She is onamlodipine 5 mg, lisinopril-hydrochlorothiazide 20-25 mg  GERD-remains on Prilosec with history of dysphagia.  Last year we discussed trial of Zantac- didn't work for her.    Vertigo- meclizine on hand due to prior episodes of BPPV. Has been better lately but can flare up with changes in barometric pressure   Hyperlipidemia- elevated lipids but at age 83 not clear that the benefits of statin outweigh the risks for primary prevention.  We will still update her lipids today and encourage lifestyle changes   Low back pain- arthritis noted on x-ray  in May. Has been better lately  Gets some pressure in toes when she goes to bed at night. Possible neuropathy. b12 has been normal. Prefers open toe  shoes. Will  check tsh.   Get ua for urinary frequency but likely due to diuretic  Slight murmur - had echo 07/05/15. Diastolic dysfunction grade 1. Trivial mitral regurg as possible cause.   Asthma Asthma-on Symbicort twice daily in the past-last year, off but was having need for increased albuterol-she agreed to restart Symbicort.  She reports her albuterol use has been very sparing. Does get some shortness of breath but has not tried her albuterol during these episodes (mild wheezing)- encouraged her to try the albuterol when having shortness of breath with walking- if this does not resolve the issue I want to see her back to do further cardiac evaluation  Future Appointments  Date Time Provider [place]  06/23/2019  4:00 PM [facility]   28-month follow-up advised Lab/Order associations: Fasting Preventative health care  Osteopenia of neck of right femur - Plan: DG Bone Density  Hyperglycemia - Plan: Hemoglobin A1c  Hyperlipidemia, unspecified hyperlipidemia type - Plan: CBC, Comprehensive metabolic panel, Lipid panel, TSH, POCT Urinalysis Dipstick (Automated)  Moderate persistent asthma without complication  Return precautions advised.  [name], MD

## 2018-07-22 NOTE — Addendum Note (Signed)
Addended by: Tomi Likens on: 07/22/2018 09:42 AM   Modules accepted: Orders

## 2018-07-22 NOTE — Assessment & Plan Note (Signed)
Asthma-on Symbicort twice daily in the past-last year, off but was having need for increased albuterol-she agreed to restart Symbicort.  She reports her albuterol use has been very sparing. Does get some shortness of breath but has not tried her albuterol during these episodes (mild wheezing)- encouraged her to try the albuterol when having shortness of breath with walking- if this does not resolve the issue I want to see her back to do further cardiac evaluation

## 2018-07-22 NOTE — Patient Instructions (Addendum)
Please stop by lab before you go  Discussed Tdap (tetanus shot)  and Shingrix at pharmacy  Schedule your bone density test at check out desk. You may also call directly to X-ray at [phone] to schedule an appointment that is convenient for you.  - located [address] across the street from [place] - in the basement - you do need an appointment for the bone density tests.   encouraged her to try the albuterol when having shortness of breath with walking- if this does not resolve the issue I want to see her back to do further cardiac evaluation  Could trial [employer] home blood pressure cuff  So glad you are doing so well and your granddaughter is doing so well-thanks for sharing!

## 2018-07-23 ENCOUNTER — Other Ambulatory Visit: Payer: Self-pay

## 2018-07-23 MED ORDER — METFORMIN HCL 500 MG PO TABS: 500.0000 mg | ORAL_TABLET | Freq: Every day | ORAL | 3 refills | Status: DC

## 2018-07-27 ENCOUNTER — Telehealth: Payer: Self-pay | Admitting: Family Medicine

## 2018-07-27 NOTE — Telephone Encounter (Signed)
Called and spoke with patient who had questions regarding metformin versus diet and exercise. She had misunderstood when I talked to her Friday thinking that her A1C was 6.5 and she would be diagnosed with diabetes if her A1C got to 6.8. I explained the at risk range and explained the significance of a 6.5 or higher reading. She verbalized understanding.

## 2018-07-27 NOTE — Telephone Encounter (Signed)
Copied from [place] [id number]. Topic: Quick Communication - See Telephone Encounter >> Jul 27, 2018  2:47 PM Sheran Luz wrote: CRM for notification. See Telephone encounter for: 07/27/18.  Patient is requesting a call back from [facility] to discuss Metformin prescription before she picks it up from pharmacy. Patient states she has spoken with Roselyn Reef about this medication previously and feels comfortable discussing her latest results and this medication further with her. Please advise.

## 2018-07-27 NOTE — Telephone Encounter (Signed)
See note

## 2018-08-04 DIAGNOSIS — H31092 Other chorioretinal scars, left eye: Secondary | ICD-10-CM | POA: Diagnosis not present

## 2018-08-04 DIAGNOSIS — H40003 Preglaucoma, unspecified, bilateral: Secondary | ICD-10-CM | POA: Diagnosis not present

## 2018-08-04 DIAGNOSIS — H53423 Scotoma of blind spot area, bilateral: Secondary | ICD-10-CM | POA: Diagnosis not present

## 2018-08-04 DIAGNOSIS — Z961 Presence of intraocular lens: Secondary | ICD-10-CM | POA: Diagnosis not present

## 2018-08-17 ENCOUNTER — Other Ambulatory Visit: Payer: Self-pay | Admitting: Family Medicine

## 2018-09-01 ENCOUNTER — Other Ambulatory Visit: Payer: Self-pay | Admitting: Family Medicine

## 2018-09-01 DIAGNOSIS — F411 Generalized anxiety disorder: Secondary | ICD-10-CM

## 2018-09-18 ENCOUNTER — Other Ambulatory Visit: Payer: Self-pay | Admitting: Family Medicine

## 2018-10-06 ENCOUNTER — Other Ambulatory Visit: Payer: Self-pay | Admitting: Family Medicine

## 2018-10-06 DIAGNOSIS — J4541 Moderate persistent asthma with (acute) exacerbation: Secondary | ICD-10-CM

## 2018-10-21 ENCOUNTER — Other Ambulatory Visit: Payer: Self-pay

## 2018-10-21 ENCOUNTER — Ambulatory Visit (INDEPENDENT_AMBULATORY_CARE_PROVIDER_SITE_OTHER): Payer: PPO | Admitting: Obstetrics and Gynecology

## 2018-10-21 ENCOUNTER — Encounter: Payer: Self-pay | Admitting: Obstetrics and Gynecology

## 2018-10-21 VITALS — BP 144/78 | HR 60 | Ht 63.0 in | Wt 168.0 lb

## 2018-10-21 DIAGNOSIS — Z01419 Encounter for gynecological examination (general) (routine) without abnormal findings: Secondary | ICD-10-CM

## 2018-10-21 DIAGNOSIS — R7303 Prediabetes: Secondary | ICD-10-CM | POA: Insufficient documentation

## 2018-10-21 NOTE — Progress Notes (Signed)
84 y.o. [id number] Divorced White or Caucasian Not Hispanic or Latino female here for annual exam.  She c/o an intermittent vaginal discharge, yellow, no other symptoms. Occurs about 1 x a month for the last year. She has GSI, occurs intermittently with a cough. Leaks on average 1 x day, usually a small amount. She wears a mini-pad. She has Asthma and coughs a lot, mainly leaks with a hard cough. Stable over the last few years. No urge incontinence.     No LMP recorded. Patient is postmenopausal.          Sexually active: No.  The current method of family planning is post menopausal status.    Exercising: No.  The patient does not participate in regular exercise at present. Smoker:  no  Health Maintenance: Pap:  11/08/2015 WNL History of abnormal Pap:  Yes, h/o dysplasia.  MMG:  09-21-12 Birads 1 negative Colonoscopy:  09-29-14 WNL  BMD:   09/27/2015  Osteopenia, she is setting up with her primary TDaP:  UTD per patient Gardasil: N/A   reports that she has never smoked. She has never used smokeless tobacco. She reports current alcohol use. She reports that she does not use drugs. Rare ETOH. She has 2 living children, one stillborn. She has a daughter and a son and 2 granddaughter (one in college and one is 34). She works part time, Hydrographic surveyor   Past Medical History:  Diagnosis Date  . Abnormal uterine bleeding   . Allergic rhinitis   . Allergy    seasonal  . Anxiety    panic attacks  . Anxiety disorder   . Asthma   . Cataract    left eye surgery to removed  . Cataract    right eye  . Colorectal cancer ([place]) 1997   T3, N0  . Depression    pt states she isn't depressed anymore, continues paxil daily  . Endometriosis   . Fibroid   . GERD (gastroesophageal reflux disease)   . Hemorrhoids   . Hyperlipidemia    diet controlled, no meds  . Hypertension   . PONV (postoperative nausea and vomiting)   . Prediabetes   . Seasonal allergies   . SVD (spontaneous vaginal delivery)    x 3  .  Thyroid disease   . Urinary incontinence     Past Surgical History:  Procedure Laterality Date  . cataract surgery Left   . COLECTOMY  1997   SIGMOID COLECTOMY 1997  . COLON SURGERY  1997  . COLONOSCOPY    . DILATATION & CURETTAGE/HYSTEROSCOPY WITH MYOSURE N/A 11/21/2015   Procedure: DILATATION & CURETTAGE/HYSTEROSCOPY WITH MYOSURE;  Surgeon: Salvadore Dom, MD;  Location: [facility];  Service: Gynecology;  Laterality: N/A;  . DILATION AND CURETTAGE OF UTERUS     D & C  . POLYPECTOMY    . TONSILLECTOMY      Current Outpatient Medications  Medication Sig Dispense Refill  . amLODipine (NORVASC) 5 MG tablet TAKE 1 to 2 TABLETS BY MOUTH EVERY DAY depending ON blood pressure 60 tablet 5  . budesonide-formoterol (SYMBICORT) 160-4.5 MCG/ACT inhaler INHALE 2 PUFFS INTO THE LUNGS 2 TIMES DAILY 10.2 g 5  . desloratadine (CLARINEX) 5 MG tablet TAKE 1 TABLET BY MOUTH EVERY DAY 30 tablet 5  . hydrocortisone (ANUSOL-HC) 2.5 % rectal cream Place 1 application rectally 2 times daily as needed for hemorrhoids. 28.35 g 1  . lisinopril-hydrochlorothiazide (PRINZIDE,ZESTORETIC) 20-25 MG tablet TAKE 1 TABLET BY MOUTH EVERY DAY 90 tablet 1  .  meclizine (ANTIVERT) 25 MG tablet TAKE 1 TABLET BY MOUTH 2 TIMES DAILY AS NEEDED FOR vertigo OR dizziness. 30 tablet 0  . omeprazole (PRILOSEC) 40 MG capsule TAKE 1 CAPSULE BY MOUTH EVERY DAY 90 capsule 1  . PROAIR HFA 108 (90 Base) MCG/ACT inhaler Inhale 2 puffs into the lungs every 6 hours as needed for wheezing or shortness of breath. 8.5 g 2  . clobetasol (TEMOVATE) 0.05 % external solution Apply 1 application topically 2 (two) times daily. For 7-10 days up to twice a day maximum. Use only on scalp (Patient not taking: Reported on 10/21/2018) 50 mL 0  . fluticasone (FLONASE) 50 MCG/ACT nasal spray PLACE 2 SPRAYS IN EACH NOSTRIL EVERY DAY (Patient not taking: Reported on 10/21/2018) 16 g 2  . metFORMIN (GLUCOPHAGE) 500 MG tablet Take 1 tablet (500 mg total) by mouth  daily with breakfast. (Patient not taking: Reported on 10/21/2018) 90 tablet 3  . PARoxetine (PAXIL) 20 MG tablet TAKE 1 TABLET BY MOUTH EVERY DAY 90 tablet 1   No current facility-administered medications for this visit.     Family History  Problem Relation Age of Onset  . Sudden death Brother 62       Apparent DVT/PE. all males  . Heart attack Brother   . Varicose Veins Mother   . Hypertension Father   . Sudden death Cousin 78       Paternal cousin -dvt/pe 7 all males  . Coronary artery disease Cousin 28       Paternal cousin, MI  . Breast cancer Daughter 73  . Stroke Maternal Grandmother   . Stroke Maternal Grandfather   . Early death Paternal Grandmother   . Tuberculosis Paternal Grandmother   . Early death Paternal Grandfather   . Tuberculosis Paternal Grandfather   . Hypertension Son   . Colon cancer Neg Hx   . Rectal cancer Neg Hx   . Stomach cancer Neg Hx     Review of Systems  Constitutional: Negative.   HENT: Negative.   Eyes: Negative.   Respiratory: Negative.   Cardiovascular: Negative.   Gastrointestinal: Negative.   Endocrine: Negative.   Genitourinary: Positive for vaginal discharge.  Musculoskeletal: Negative.   Skin: Negative.   Allergic/Immunologic: Negative.   Neurological: Negative.   Hematological: Negative.   Psychiatric/Behavioral: Negative.     Exam:   BP (!) 144/78 (BP Location: Right Arm, Patient Position: Sitting, Cuff Size: Normal)   Pulse 60   Ht 5\' 3"  (1.6 m)   Wt 168 lb (76.2 kg)   BMI 29.76 kg/m   Weight change: @WEIGHTCHANGE @ Height:   Height: 5\' 3"  (160 cm)  Ht Readings from Last 3 Encounters:  10/21/18 5\' 3"  (1.6 m)  07/22/18 5\' 3"  (1.6 m)  06/10/18 5\' 3"  (1.6 m)    General appearance: alert, cooperative and appears stated age Head: Normocephalic, without obvious abnormality, atraumatic Neck: no adenopathy, supple, symmetrical, trachea midline and thyroid normal to inspection and palpation Lungs: clear to auscultation  bilaterally Cardiovascular: regular rate and rhythm Breasts: normal appearance, she has a 1.5 cm mobile, smooth lump in the left breast at 8 o'clock at the periphery (no change per patient and prior negative evaluation), no dimpling , no other lumps Abdomen: soft, non-tender; non distended,  no masses,  no organomegaly Extremities: extremities normal, atraumatic, no cyanosis or edema Skin: Skin color, texture, turgor normal. No rashes or lesions Lymph nodes: Cervical, supraclavicular, and axillary nodes normal. No abnormal inguinal nodes palpated Neurologic: Grossly normal  Pelvic: External genitalia:  no lesions              Urethra:  normal appearing urethra with no masses, tenderness or lesions              Bartholins and Skenes: normal                 Vagina: normal appearing vagina with normal color and discharge, no lesions              Cervix: no lesions               Bimanual Exam:  Uterus:  normal size, contour, position, consistency, mobility, non-tender              Adnexa: no mass, fullness, tenderness               Rectovaginal: Confirms               Anus:  normal sphincter tone, no lesions  Chaperone was present for exam.  A:  Well Woman with normal exam  H/O colon cancer, 22 years ago, no more colonoscopies recommended  P:   No pap needed  DEXA with primary  Discussed mammogram  Colonoscopy in 2016  Discussed breast self exam  Discussed calcium and vit D intake

## 2018-10-21 NOTE — Patient Instructions (Signed)
EXERCISE AND DIET:  We recommended that you start or continue a regular exercise program for good health. Regular exercise means any activity that makes your heart beat faster and makes you sweat.  We recommend exercising at least 30 minutes per day at least 3 days a week, preferably 4 or 5.  We also recommend a diet low in fat and sugar.  Inactivity, poor dietary choices and obesity can cause diabetes, heart attack, stroke, and kidney damage, among others.    ALCOHOL AND SMOKING:  Women should limit their alcohol intake to no more than 7 drinks/beers/glasses of wine (combined, not each!) per week. Moderation of alcohol intake to this level decreases your risk of breast cancer and liver damage. And of course, no recreational drugs are part of a healthy lifestyle.  And absolutely no smoking or even second hand smoke. Most people know smoking can cause heart and lung diseases, but did you know it also contributes to weakening of your bones? Aging of your skin?  Yellowing of your teeth and nails?  CALCIUM AND VITAMIN D:  Adequate intake of calcium and Vitamin D are recommended.  The recommendations for exact amounts of these supplements seem to change often, but generally speaking 1,200 mg of calcium (between diet and supplement) and 800 units of Vitamin D per day seems prudent. Certain women may benefit from higher intake of Vitamin D.  If you are among these women, your doctor will have told you during your visit.    PAP SMEARS:  Pap smears, to check for cervical cancer or precancers,  have traditionally been done yearly, although recent scientific advances have shown that most women can have pap smears less often.  However, every woman still should have a physical exam from her gynecologist every year. It will include a breast check, inspection of the vulva and vagina to check for abnormal growths or skin changes, a visual exam of the cervix, and then an exam to evaluate the size and shape of the uterus and  ovaries.  And after 82 years of age, a rectal exam is indicated to check for rectal cancers. We will also provide age appropriate advice regarding health maintenance, like when you should have certain vaccines, screening for sexually transmitted diseases, bone density testing, colonoscopy, mammograms, etc.   MAMMOGRAMS:  All women over 40 years old should have a yearly mammogram. Many facilities now offer a "3D" mammogram, which may cost around $50 extra out of pocket. If possible,  we recommend you accept the option to have the 3D mammogram performed.  It both reduces the number of women who will be called back for extra views which then turn out to be normal, and it is better than the routine mammogram at detecting truly abnormal areas.    COLON CANCER SCREENING: Now recommend starting at age 45. At this time colonoscopy is not covered for routine screening until 50. There are take home tests that can be done between 45-49.   COLONOSCOPY:  Colonoscopy to screen for colon cancer is recommended for all women at age 50.  We know, you hate the idea of the prep.  We agree, BUT, having colon cancer and not knowing it is worse!!  Colon cancer so often starts as a polyp that can be seen and removed at colonscopy, which can quite literally save your life!  And if your first colonoscopy is normal and you have no family history of colon cancer, most women don't have to have it again for   10 years.  Once every ten years, you can do something that may end up saving your life, right?  We will be happy to help you get it scheduled when you are ready.  Be sure to check your insurance coverage so you understand how much it will cost.  It may be covered as a preventative service at no cost, but you should check your particular policy.      Breast Self-Awareness Breast self-awareness means being familiar with how your breasts look and feel. It involves checking your breasts regularly and reporting any changes to your  health care provider. Practicing breast self-awareness is important. A change in your breasts can be a sign of a serious medical problem. Being familiar with how your breasts look and feel allows you to find any problems early, when treatment is more likely to be successful. All women should practice breast self-awareness, including women who have had breast implants. How to do a breast self-exam One way to learn what is normal for your breasts and whether your breasts are changing is to do a breast self-exam. To do a breast self-exam: Look for Changes  1. Remove all the clothing above your waist. 2. Stand in front of a mirror in a room with good lighting. 3. Put your hands on your hips. 4. Push your hands firmly downward. 5. Compare your breasts in the mirror. Look for differences between them (asymmetry), such as: ? Differences in shape. ? Differences in size. ? Puckers, dips, and bumps in one breast and not the other. 6. Look at each breast for changes in your skin, such as: ? Redness. ? Scaly areas. 7. Look for changes in your nipples, such as: ? Discharge. ? Bleeding. ? Dimpling. ? Redness. ? A change in position. Feel for Changes Carefully feel your breasts for lumps and changes. It is best to do this while lying on your back on the floor and again while sitting or standing in the shower or tub with soapy water on your skin. Feel each breast in the following way:  Place the arm on the side of the breast you are examining above your head.  Feel your breast with the other hand.  Start in the nipple area and make  inch (2 cm) overlapping circles to feel your breast. Use the pads of your three middle fingers to do this. Apply light pressure, then medium pressure, then firm pressure. The light pressure will allow you to feel the tissue closest to the skin. The medium pressure will allow you to feel the tissue that is a little deeper. The firm pressure will allow you to feel the tissue  close to the ribs.  Continue the overlapping circles, moving downward over the breast until you feel your ribs below your breast.  Move one finger-width toward the center of the body. Continue to use the  inch (2 cm) overlapping circles to feel your breast as you move slowly up toward your collarbone.  Continue the up and down exam using all three pressures until you reach your armpit.  Write Down What You Find  Write down what is normal for each breast and any changes that you find. Keep a written record with breast changes or normal findings for each breast. By writing this information down, you do not need to depend only on memory for size, tenderness, or location. Write down where you are in your menstrual cycle, if you are still menstruating. If you are having trouble noticing differences   in your breasts, do not get discouraged. With time you will become more familiar with the variations in your breasts and more comfortable with the exam. How often should I examine my breasts? Examine your breasts every month. If you are breastfeeding, the best time to examine your breasts is after a feeding or after using a breast pump. If you menstruate, the best time to examine your breasts is 5-7 days after your period is over. During your period, your breasts are lumpier, and it may be more difficult to notice changes. When should I see my health care provider? See your health care provider if you notice:  A change in shape or size of your breasts or nipples.  A change in the skin of your breast or nipples, such as a reddened or scaly area.  Unusual discharge from your nipples.  A lump or thick area that was not there before.  Pain in your breasts.  Anything that concerns you.  

## 2018-10-27 ENCOUNTER — Ambulatory Visit: Payer: PPO | Admitting: Family Medicine

## 2018-11-05 ENCOUNTER — Other Ambulatory Visit: Payer: PPO

## 2018-11-17 ENCOUNTER — Ambulatory Visit: Payer: PPO | Admitting: Family Medicine

## 2018-11-20 ENCOUNTER — Ambulatory Visit: Payer: Self-pay | Admitting: *Deleted

## 2018-11-20 NOTE — Telephone Encounter (Signed)
Patient reports she ate a hotdog that didn't taste "right " to her yesterday - she has started vomiting this morning and she has continued throughout the day. Patient is concerned about dehydration because she has not been able to eat or drink today. She has only been able to take small sips- she has no one to transport her to the office or ED- her family members are out of town. Patient states she has no friends that can help her. Per protocol patient needs to be seen- encouraged patient to call 911 for help today- she agrees.   Reason for Disposition . [1] MODERATE vomiting (e.g., 3 - 5 times/day) AND [2] age > 1  Answer Assessment - Initial Assessment Questions 1. VOMITING SEVERITY: "How many times have you vomited in the past 24 hours?"     - MILD:  1 - 2 times/day    - MODERATE: 3 - 5 times/day, decreased oral intake without significant weight loss or symptoms of dehydration    - SEVERE: 6 or more times/day, vomits everything or nearly everything, with significant weight loss, symptoms of dehydration      5 times 2. ONSET: "When did the vomiting begin?"      10:00 am 3. FLUIDS: "What fluids or food have you vomited up today?" "Have you been able to keep any fluids down?"     Coffee and oatmeal- sips of unsweet tea 4. ABDOMINAL PAIN: "Are your having any abdominal pain?" If yes : "How bad is it and what does it feel like?" (e.g., crampy, dull, intermittent, constant)      No abdominal pain 5. DIARRHEA: "Is there any diarrhea?" If so, ask: "How many times today?"      no 6. CONTACTS: "Is there anyone else in the family with the same symptoms?"      no 7. CAUSE: "What do you think is causing your vomiting?"     Possible bad food 8. HYDRATION STATUS: "Any signs of dehydration?" (e.g., dry mouth [not only dry lips], too weak to stand) "When did you last urinate?"     Dry mouth, weakness  Patient is urinating 9. OTHER SYMPTOMS: "Do you have any other symptoms?" (e.g., fever, headache,  vertigo, vomiting blood or coffee grounds, recent head injury)     no 10. PREGNANCY: "Is there any chance you are pregnant?" "When was your last menstrual period?"       n/a  Protocols used: [facility]

## 2018-11-20 NOTE — Telephone Encounter (Signed)
See note

## 2018-11-20 NOTE — Telephone Encounter (Signed)
Agree with care advice provided.

## 2018-11-23 NOTE — Telephone Encounter (Signed)
FYI

## 2018-11-23 NOTE — Telephone Encounter (Signed)
dont see she is in ER yet. Please see if she has gone by tomorrow- if not call and check on her. Would advise her to hold lisinopril-hctz combo pill until vomiting gone at least 24 hours. I agree with fact that she needs to be evaluated.

## 2018-11-24 NOTE — Telephone Encounter (Signed)
Spoke to pt and she stated that she feels a lot better. She stated that she will f/u with Dr. Yong Channel Monday at her appt.

## 2018-11-27 ENCOUNTER — Other Ambulatory Visit: Payer: Self-pay

## 2018-11-27 ENCOUNTER — Ambulatory Visit: Payer: Self-pay | Admitting: Hematology

## 2018-11-27 DIAGNOSIS — R739 Hyperglycemia, unspecified: Secondary | ICD-10-CM

## 2018-11-27 NOTE — Telephone Encounter (Signed)
Patient called, left VM to return call to the office to discuss Metformin.

## 2018-11-27 NOTE — Telephone Encounter (Signed)
See note

## 2018-11-27 NOTE — Telephone Encounter (Signed)
Spoke to pt and answered her questions. Pt was worried about coming in Monday due to the [place] virus. Pt was scheduled at the end of March when she stated she was comfortable with coming in. Pt had no question about Metformin as she stated. No further action needed!

## 2018-11-27 NOTE — Telephone Encounter (Signed)
Patient called, left VM to return call to the office to discuss Metformin. Attempted x 3 by NT, will route to office for provider.  Message from Sheran Luz sent at 11/27/2018 9:45 AM EDT   Summary: med question    Patient requesting a call back to discuss metFORMIN (GLUCOPHAGE) 500 MG tablet.

## 2018-11-27 NOTE — Telephone Encounter (Signed)
Patient called in with a question about Metformin.  Attempted to call the patient back but there was no answer.  Message left on machine. /

## 2018-11-30 ENCOUNTER — Ambulatory Visit: Payer: PPO | Admitting: Family Medicine

## 2018-12-02 ENCOUNTER — Inpatient Hospital Stay: Admission: RE | Admit: 2018-12-02 | Payer: PPO | Source: Ambulatory Visit

## 2018-12-04 ENCOUNTER — Other Ambulatory Visit: Payer: Self-pay | Admitting: Family Medicine

## 2018-12-09 ENCOUNTER — Other Ambulatory Visit: Payer: PPO

## 2019-01-11 ENCOUNTER — Other Ambulatory Visit: Payer: Self-pay

## 2019-01-11 ENCOUNTER — Encounter: Payer: Self-pay | Admitting: Family Medicine

## 2019-01-11 ENCOUNTER — Ambulatory Visit: Payer: PPO | Admitting: Family Medicine

## 2019-01-11 ENCOUNTER — Ambulatory Visit: Payer: Self-pay | Admitting: *Deleted

## 2019-01-11 ENCOUNTER — Ambulatory Visit (INDEPENDENT_AMBULATORY_CARE_PROVIDER_SITE_OTHER): Payer: PPO | Admitting: Family Medicine

## 2019-01-11 VITALS — BP 158/70 | HR 51 | Temp 97.9°F | Ht 63.0 in | Wt 166.0 lb

## 2019-01-11 DIAGNOSIS — I10 Essential (primary) hypertension: Secondary | ICD-10-CM

## 2019-01-11 DIAGNOSIS — Z6829 Body mass index (BMI) 29.0-29.9, adult: Secondary | ICD-10-CM | POA: Diagnosis not present

## 2019-01-11 DIAGNOSIS — R739 Hyperglycemia, unspecified: Secondary | ICD-10-CM | POA: Diagnosis not present

## 2019-01-11 DIAGNOSIS — E663 Overweight: Secondary | ICD-10-CM | POA: Diagnosis not present

## 2019-01-11 NOTE — Patient Instructions (Signed)
Continue lisinopril hydrochlorothiazide at current dose  Increase Norvasc to 2 tablets every evening-this should be a total of 10 mg  Set up a video visit before you leave for 1 week   Please stop by lab before you go If you do not have mychart- we will call you about results within 5 business days of [place] receiving them.  If you have mychart- we will send your results within 3 business days of [place] receiving them.  If abnormal or we want to clarify a result, we will call or mychart you to make sure you receive the message.  If you have questions or concerns or don't hear within 5-7 days, please send [place] a message or call us.

## 2019-01-11 NOTE — Progress Notes (Signed)
Phone [phone]   Subjective:  Tammie [name] is a 84 y.o. year old very pleasant female patient who presents for/with See problem oriented charting ROS-  mild HA when blood pressure high.  No chest pain or shortness of breath.  Minimal edema.  Past Medical History-  Patient Active Problem List   Diagnosis Date Noted  . Hyperglycemia 07/15/2017    Priority: High  . Osteopenia 10/02/2015    Priority: High  . Hyperlipidemia 01/15/2018    Priority: Medium  . Vertigo 07/15/2017    Priority: Medium  . Hypertension     Priority: Medium  . Panic attacks     Priority: Medium  . History of colorectal cancer     Priority: Medium  . Asthma     Priority: Medium  . Left leg pain 12/20/2016    Priority: Low  . Hemorrhoids 12/20/2016    Priority: Low  . Dermatitis 12/20/2016    Priority: Low  . Systolic murmur 17/51/0258    Priority: Low  . Allergic rhinitis     Priority: Low  . GERD 06/22/2009    Priority: Low  . Prediabetes     Medications- reviewed and updated Current Outpatient Medications  Medication Sig Dispense Refill  . amLODipine (NORVASC) 5 MG tablet TAKE 1 to 2 TABLETS BY MOUTH EVERY DAY depending ON blood pressure 60 tablet 5  . budesonide-formoterol (SYMBICORT) 160-4.5 MCG/ACT inhaler INHALE 2 PUFFS INTO THE LUNGS 2 TIMES DAILY 10.2 g 5  . clobetasol (TEMOVATE) 0.05 % external solution Apply 1 application topically 2 (two) times daily. For 7-10 days up to twice a day maximum. Use only on scalp 50 mL 0  . desloratadine (CLARINEX) 5 MG tablet TAKE 1 TABLET BY MOUTH EVERY DAY 30 tablet 5  . fluticasone (FLONASE) 50 MCG/ACT nasal spray PLACE 2 SPRAYS IN EACH NOSTRIL EVERY DAY 16 g 2  . hydrocortisone (ANUSOL-HC) 2.5 % rectal cream Place 1 application rectally 2 times daily as needed for hemorrhoids. 28.35 g 1  . lisinopril-hydrochlorothiazide (PRINZIDE,ZESTORETIC) 20-25 MG tablet TAKE 1 TABLET BY MOUTH EVERY DAY 90 tablet 1  . meclizine (ANTIVERT) 25 MG tablet TAKE 1  TABLET BY MOUTH 2 TIMES DAILY AS NEEDED FOR vertigo OR dizziness. 30 tablet 0  . omeprazole (PRILOSEC) 40 MG capsule TAKE 1 CAPSULE BY MOUTH EVERY DAY 90 capsule 1  . PARoxetine (PAXIL) 20 MG tablet TAKE 1 TABLET BY MOUTH EVERY DAY 90 tablet 1  . PROAIR HFA 108 (90 Base) MCG/ACT inhaler Inhale 2 puffs into the lungs every 6 hours as needed for wheezing or shortness of breath. 8.5 g 2   No current facility-administered medications for this visit.      Objective:  BP (!) 158/70 (BP Location: Left Arm, Patient Position: Sitting, Cuff Size: Normal)   Pulse (!) 51   Temp 97.9 F (36.6 C) (Oral)   Ht 5\' 3"  (1.6 m)   Wt 166 lb (75.3 kg)   SpO2 96%   BMI 29.41 kg/m  Gen: NAD, resting comfortably CV: RRR no murmurs rubs or gallops Lungs: CTAB no crackles, wheeze, rhonchi Ext: Trace edema Skin: warm, dry Neuro: Normal gait and speech    Assessment and Plan   #hypertension S: controlled poorly on amlodipine 5 mg, lisinopril hydrochlorothiazide 20-25 mg.  Got a new home cuff and home readings ranging from 1 50-1 80 typically BP Readings from Last 3 Encounters:  01/11/19 (!) 158/70  01/11/19 (!) 165/70  10/21/18 (!) 144/78  A/P: Poor control  today-patient has been on lisinopril hydrochlorothiazide 20-25 mg as well as amlodipine 5 mg- she agreed to increase amlodipine to 10 mg nightly and continue lisinopril hydrochlorothiazide.  We will do another video visit in a week to discuss blood pressure-may need to increase lisinopril to max dose 40 mg.  # PreDiabetes/overweight S: Poorly controlled on no medication in the past-recommended metformin 500 mg after last visit but patient declined.  On our scales from November visit appears patient has lost 11 pounds-she states she has tried to eat much better. Lab Results  Component Value Date   HGBA1C 6.8 (H) 07/22/2018   HGBA1C 6.1 07/15/2017   HGBA1C 5.8 02/09/2015    A/P: Patient's last A1c was in diabetes range-if this persists today would  have to diagnose has diabetes.  She prefers strongly to stay off metformin-would reopen this discussion if A1c got over 7.  Hopeful with lifestyle modifications it does not.  Congratulated her on her efforts for weight loss- Encouraged need for healthy eating, regular exercise, weight loss.      Future Appointments  Date Time Provider [place]  01/21/2019  2:20 PM Marin Olp, MD [facility]   Lab/Order associations: Hyperglycemia - Plan: Hemoglobin A1c  Essential hypertension - Plan: CBC, Comprehensive metabolic panel, TSH  Overweight  BMI 29.0-29.9,adult  Return precautions advised.  [name], MD

## 2019-01-11 NOTE — Telephone Encounter (Signed)
See note, did not schedule virtual visit because patient requests to come into office due to wanting BP checked.

## 2019-01-11 NOTE — Telephone Encounter (Signed)
Pt reports BP Saturday AM 171/70. States bought new cuff, checked [phone]. Reports headache Sunday and "Panic attack because my pressure was so high." Takes amlodipine 5 mg QHS, states took additional 5mg  Sunday but did not recheck BP. Also takes lisinopril-hctz 20-25mg   QHS. During call BP 172/66, after 5 minutes 165/63, HR reads 49. Pt anxious, states "I would like to come in and have my BP checked at the office, I don't trust this machine."   Reports "Very mild headache this AM," no other symptoms. TN called practice, spoke to [facility]. Will route triage note high priority as instructed. Pt made aware. Care advise given per protocol.  CB# [phone]  Reason for Disposition . Systolic BP  >= 956 OR Diastolic >= 213  Answer Assessment - Initial Assessment Questions 1. BLOOD PRESSURE: "What is the blood pressure?" "Did you take at least two measurements 5 minutes apart?"    172/66   And   165/63   5 minutes apart 2. ONSET: "When did you take your blood pressure?"     During call 3. HOW: "How did you obtain the blood pressure?" (e.g., visiting nurse, automatic home BP monitor)    New home monitor , arm cuff 4. HISTORY: "Do you have a history of high blood pressure?"     yes 5. MEDICATIONS: "Are you taking any medications for blood pressure?" "Have you missed any doses recently?"    Yes, no missed doses 6. OTHER SYMPTOMS: "Do you have any symptoms?" (e.g., headache, chest pain, blurred vision, difficulty breathing, weakness)     Mild headache  Protocols used: HIGH BLOOD PRESSURE-A-AH

## 2019-01-12 LAB — CBC
HCT: 43.5 % (ref 36.0–46.0)
Hemoglobin: 15.5 g/dL — ABNORMAL HIGH (ref 12.0–15.0)
MCHC: 35.6 g/dL (ref 30.0–36.0)
MCV: 90.8 fl (ref 78.0–100.0)
Platelets: 277 10*3/uL (ref 150.0–400.0)
RBC: 4.78 Mil/uL (ref 3.87–5.11)
RDW: 13.5 % (ref 11.5–15.5)
WBC: 9 10*3/uL (ref 4.0–10.5)

## 2019-01-12 LAB — COMPREHENSIVE METABOLIC PANEL
ALT: 15 U/L (ref 0–35)
AST: 20 U/L (ref 0–37)
Albumin: 4.4 g/dL (ref 3.5–5.2)
Alkaline Phosphatase: 81 U/L (ref 39–117)
BUN: 15 mg/dL (ref 6–23)
CO2: 33 mEq/L — ABNORMAL HIGH (ref 19–32)
Calcium: 10.7 mg/dL — ABNORMAL HIGH (ref 8.4–10.5)
Chloride: 99 mEq/L (ref 96–112)
Creatinine, Ser: 0.77 mg/dL (ref 0.40–1.20)
GFR: 71.88 mL/min (ref >60.00)
Glucose, Bld: 127 mg/dL — ABNORMAL HIGH (ref 70–99)
Potassium: 3.9 mEq/L (ref 3.5–5.1)
Sodium: 138 mEq/L (ref 135–145)
Total Bilirubin: 0.7 mg/dL (ref 0.2–1.2)
Total Protein: 7.2 g/dL (ref 6.0–8.3)

## 2019-01-12 LAB — TSH: TSH: 2.95 u[IU]/mL (ref 0.35–4.50)

## 2019-01-12 LAB — HEMOGLOBIN A1C: Hgb A1c MFr Bld: 5.8 % (ref 4.6–6.5)

## 2019-01-12 NOTE — Progress Notes (Signed)
LOS - no charge  Visit was moved from video to in person (video visit was already arrived and then cancelled to move to later in person slot- unfortunately producing two separate vsitis

## 2019-01-12 NOTE — Patient Instructions (Signed)
There are no preventive care reminders to display for this patient.  Depression screen [facility] 2/9 06/10/2018 07/15/2017 07/15/2017  Decreased Interest 0 - 0  Down, Depressed, Hopeless 0 (No Data) 1  PHQ - 2 Score 0 - 1

## 2019-01-19 ENCOUNTER — Ambulatory Visit: Payer: PPO | Admitting: Family Medicine

## 2019-01-21 ENCOUNTER — Ambulatory Visit: Payer: PPO | Admitting: Family Medicine

## 2019-01-25 ENCOUNTER — Encounter: Payer: Self-pay | Admitting: Family Medicine

## 2019-01-25 ENCOUNTER — Telehealth (INDEPENDENT_AMBULATORY_CARE_PROVIDER_SITE_OTHER): Payer: PPO | Admitting: Family Medicine

## 2019-01-25 VITALS — BP 144/64 | HR 54 | Ht 63.0 in | Wt 165.0 lb

## 2019-01-25 DIAGNOSIS — I10 Essential (primary) hypertension: Secondary | ICD-10-CM | POA: Diagnosis not present

## 2019-01-25 DIAGNOSIS — E663 Overweight: Secondary | ICD-10-CM | POA: Diagnosis not present

## 2019-01-25 MED ORDER — LISINOPRIL-HYDROCHLOROTHIAZIDE 20-25 MG PO TABS: 1.0000 | ORAL_TABLET | Freq: Every day | ORAL | 3 refills | Status: DC

## 2019-01-25 MED ORDER — AMLODIPINE BESYLATE 10 MG PO TABS: 10.0000 mg | ORAL_TABLET | Freq: Every day | ORAL | 3 refills | Status: DC

## 2019-01-25 NOTE — Progress Notes (Signed)
Phone [phone]   Subjective:  Virtual visit via Video note. Chief complaint: Chief Complaint  Patient presents with  . Hypertension    1 week f/u    This visit type was conducted due to national recommendations for restrictions regarding the COVID-19 Pandemic (e.g. social distancing).  This format is felt to be most appropriate for this patient at this time balancing risks to patient and risks to population by having him in for in person visit.  No physical exam was performed (except for noted visual exam or audio findings with Telehealth visits).    Our team/I attempted to connect with Tammie [name] on 01/25/19 at  1:40 PM EDT by a video enabled telemedicine application ([email or web address])  -Interactive audio and video telecommunications were attempted between this provider and patient, however failed, due to patient having technical difficulties OR patient did not have access to video capability.  We continued and completed visit with audio only. I verified that I am speaking with the correct person using two identifiers.  Location patient: [place] Location provider: [facility], office Persons participating in the virtual visit:  patient  Our team/I discussed the limitations of evaluation and management by telemedicine and the availability of in person appointments. In light of current covid-19 pandemic, patient also understands that we are trying to protect them by minimizing in office contact if at all possible.  The patient expressed consent for telemedicine visit and agreed to proceed. Patient understands insurance will be billed.   ROS- No chest pain or shortness of breath. No headache or blurry vision.    Past Medical History-  Patient Active Problem List   Diagnosis Date Noted  . Hyperglycemia 07/15/2017    Priority: High  . Osteopenia 10/02/2015    Priority: High  . Hyperlipidemia 01/15/2018    Priority: Medium  . Vertigo 07/15/2017    Priority: Medium  . Hypertension    Priority: Medium  . Panic attacks     Priority: Medium  . History of colorectal cancer     Priority: Medium  . Asthma     Priority: Medium  . Left leg pain 12/20/2016    Priority: Low  . Hemorrhoids 12/20/2016    Priority: Low  . Dermatitis 12/20/2016    Priority: Low  . Systolic murmur 82/50/5397    Priority: Low  . Allergic rhinitis     Priority: Low  . GERD 06/22/2009    Priority: Low  . Prediabetes     Medications- reviewed and updated Current Outpatient Medications  Medication Sig Dispense Refill  . budesonide-formoterol (SYMBICORT) 160-4.5 MCG/ACT inhaler INHALE 2 PUFFS INTO THE LUNGS 2 TIMES DAILY 10.2 g 5  . clobetasol (TEMOVATE) 0.05 % external solution Apply 1 application topically 2 (two) times daily. For 7-10 days up to twice a day maximum. Use only on scalp 50 mL 0  . desloratadine (CLARINEX) 5 MG tablet TAKE 1 TABLET BY MOUTH EVERY DAY 30 tablet 5  . fluticasone (FLONASE) 50 MCG/ACT nasal spray PLACE 2 SPRAYS IN EACH NOSTRIL EVERY DAY 16 g 2  . hydrocortisone (ANUSOL-HC) 2.5 % rectal cream Place 1 application rectally 2 times daily as needed for hemorrhoids. 28.35 g 1  . lisinopril-hydrochlorothiazide (ZESTORETIC) 20-25 MG tablet Take 1 tablet by mouth daily. 90 tablet 3  . meclizine (ANTIVERT) 25 MG tablet TAKE 1 TABLET BY MOUTH 2 TIMES DAILY AS NEEDED FOR vertigo OR dizziness. 30 tablet 0  . omeprazole (PRILOSEC) 40 MG capsule TAKE 1 CAPSULE BY MOUTH EVERY DAY  90 capsule 1  . PARoxetine (PAXIL) 20 MG tablet TAKE 1 TABLET BY MOUTH EVERY DAY 90 tablet 1  . PROAIR HFA 108 (90 Base) MCG/ACT inhaler Inhale 2 puffs into the lungs every 6 hours as needed for wheezing or shortness of breath. 8.5 g 2  . amLODipine (NORVASC) 10 MG tablet Take 1 tablet (10 mg total) by mouth daily. 90 tablet 3   No current facility-administered medications for this visit.      Objective:  BP (!) 144/64   Pulse (!) 54   Ht 5\' 3"  (1.6 m)   Wt 165 lb (74.8 kg)   BMI 29.23 kg/m  self  reported vitals Normal speech-nonlabored breathing     Assessment and Plan   #hypertension/overweight S: poorly controlled on home checks on amlodipine 10mg  and lisinopril hctz 20-25 mg BP Readings from Last 3 Encounters:  01/25/19 (!) 144/64  01/11/19 (!) 158/70  01/11/19 (!) 165/70  A/P: Poor control- continue amlodipine 10 mg (changed to 10mg  pill from two of the 5 mg tablets).  We strongly discussed increasing lisinopril hydrochlorothiazide to full dose 40-25 mg by using 2 tablet of 20-12.5 mg doses versus stopping combo pill and using lisinopril 20 mg and chlorthalidone 25 mg.  After long discussion patient would prefer to focus on a low-salt diet, increase her exercise-she has already lost 2 pounds since last visit and states she is trying to work on this-if I have not heard from her in a month I will reach out to see her blood pressure is doing.  Plan have been 3 to 4-week follow-up if we change medication  For overweight- Encouraged need for healthy eating, regular exercise, weight loss.    Lab/Order associations: Essential hypertension  Overweight  Meds ordered this encounter  Medications  . amLODipine (NORVASC) 10 MG tablet    Sig: Take 1 tablet (10 mg total) by mouth daily.    Dispense:  90 tablet    Refill:  3  . lisinopril-hydrochlorothiazide (ZESTORETIC) 20-25 MG tablet    Sig: Take 1 tablet by mouth daily.    Dispense:  90 tablet    Refill:  3    Return precautions advised.  [name], MD

## 2019-01-25 NOTE — Patient Instructions (Addendum)
There are no preventive care reminders to display for this patient.  Depression screen [facility] 2/9 01/25/2019 06/10/2018 07/15/2017  Decreased Interest 0 0 -  Down, Depressed, Hopeless 0 0 (No Data)  PHQ - 2 Score 0 0 -  Altered sleeping 0 - -  Tired, decreased energy 3 - -  Change in appetite 0 - -  Feeling bad or failure about yourself  0 - -  Trouble concentrating 0 - -  Moving slowly or fidgety/restless 0 - -  Suicidal thoughts 0 - -  PHQ-9 Score 3 - -   Failed video

## 2019-02-06 ENCOUNTER — Other Ambulatory Visit: Payer: Self-pay | Admitting: Family Medicine

## 2019-02-22 ENCOUNTER — Other Ambulatory Visit: Payer: Self-pay | Admitting: Family Medicine

## 2019-02-24 ENCOUNTER — Telehealth: Payer: Self-pay

## 2019-02-24 NOTE — Telephone Encounter (Signed)
Ideally we would keep blood pressure less than 140/90-that being said given she is over age 84 I think leaving her at current blood pressure is reasonable as long as does not increase since she has never had a heart attack or stroke.  I would recommend scheduling a 4 to 76-month follow-up for in person check

## 2019-02-24 NOTE — Telephone Encounter (Signed)
-----   Message from Marin Olp, MD sent at 02/24/2019  7:50 AM EDT ----- Please convert to phone note.   How have home blood pressures been? What blood pressure medicines is she currently taking?  Thanks, [name]

## 2019-02-24 NOTE — Telephone Encounter (Signed)
Called and spoke with patient. BP has been doing well. She took a week vacation at the beach and has been trying to avoid the news. She has been taking Norvac 10 mg daily and Lisinopril-HCT 20-25 mg daily. Her BP has been averaging about 140/80-90. She feels like the swelling in her legs has improved.

## 2019-02-25 NOTE — Telephone Encounter (Signed)
Spoke to pt and relayed Dr. Ansel Bong message.  Pt has been scheduled for f/u in November 2020 per recommendation.  Pt will contact us if she has any new issue with her BP and pt states that she plans to continue her BP log, trying to take her BP more regularly and consistently.

## 2019-03-01 IMAGING — DX DG LUMBAR SPINE COMPLETE 4+V
5 series · 5 of 5 positions shown · non-contrast
Comparison: None.

CLINICAL DATA: Chronic bilateral low back pain

EXAM:
LUMBAR SPINE - COMPLETE 4+ VIEW

[lumbar spine ap]
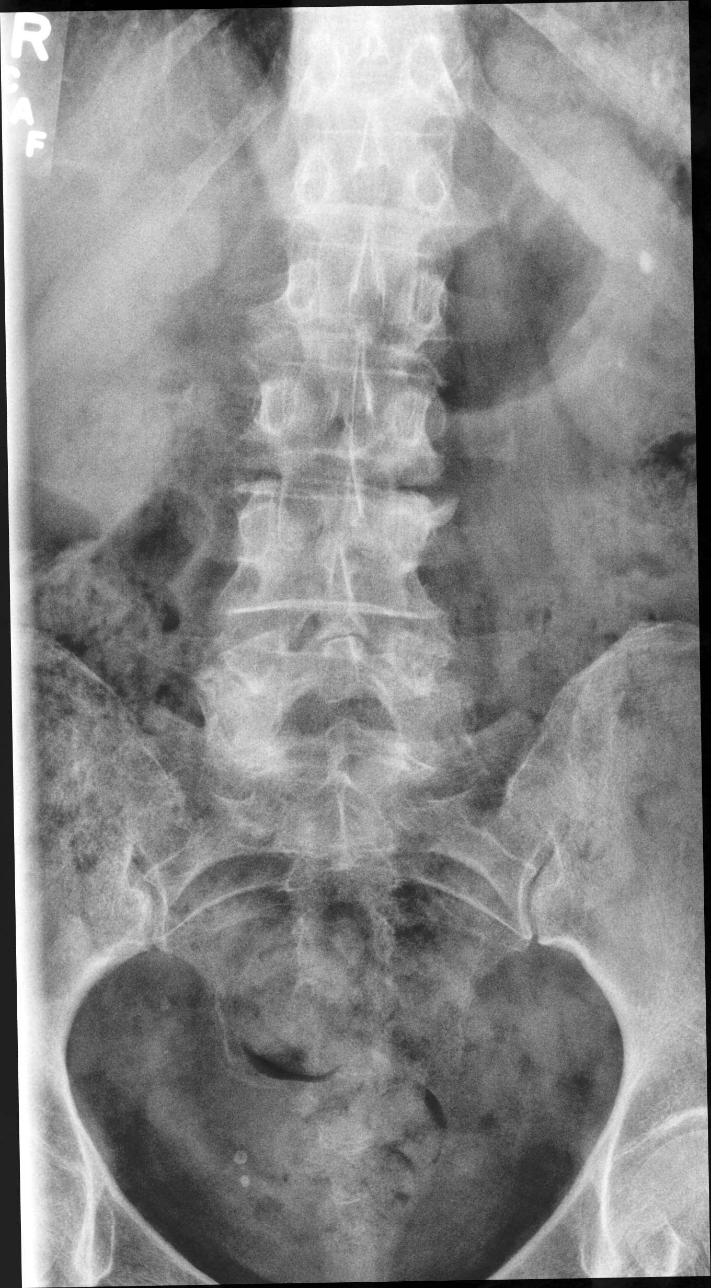

[lumbar spine oblique (1 of 2)]
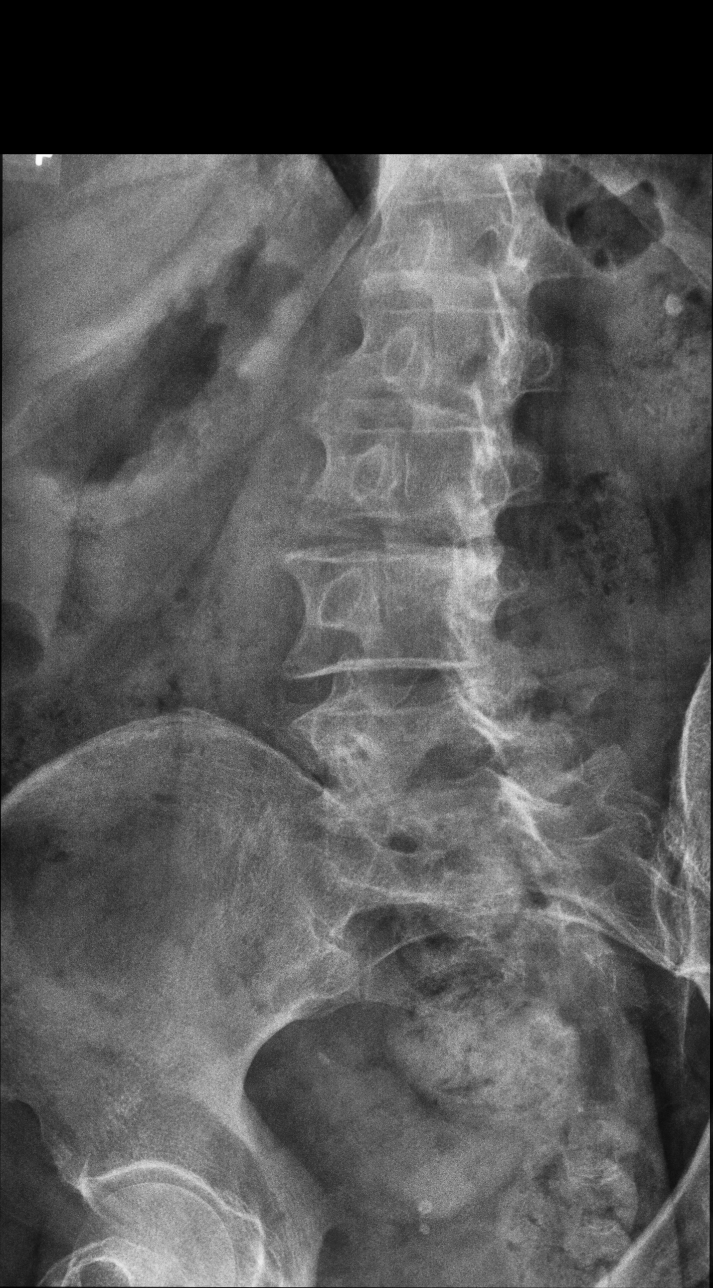

[lumbar spine oblique (2 of 2)]
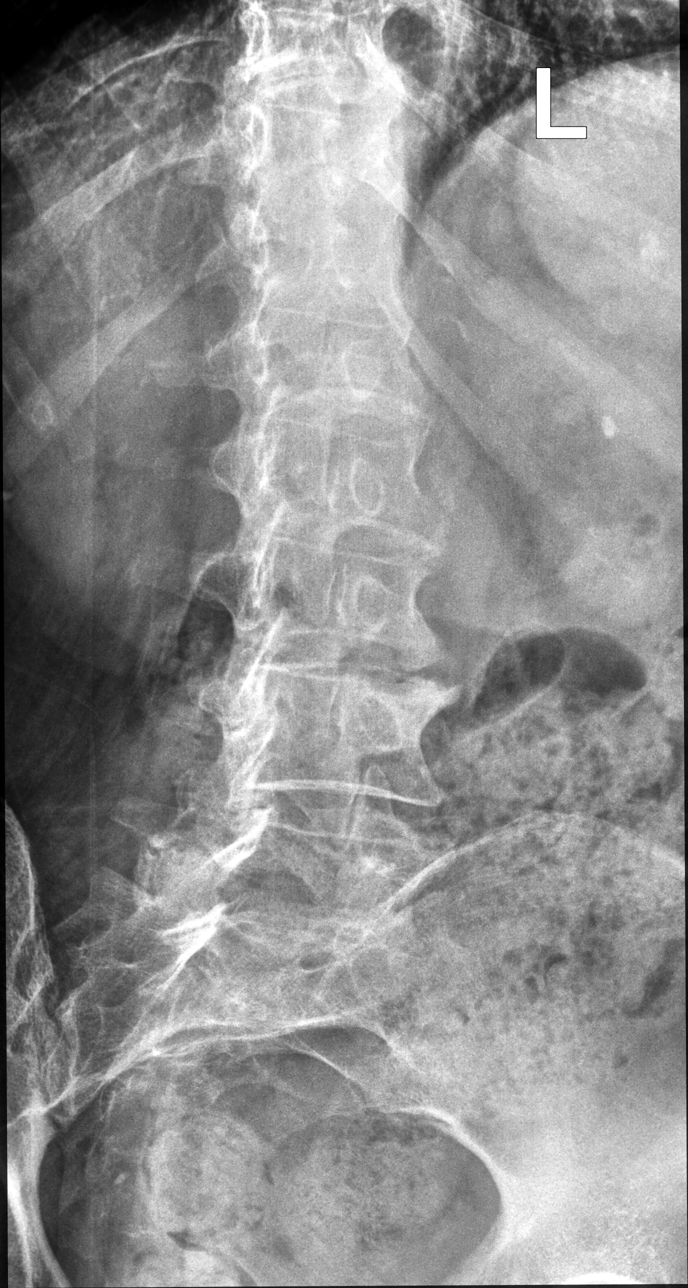

[lumbar spine lat (1 of 2)]
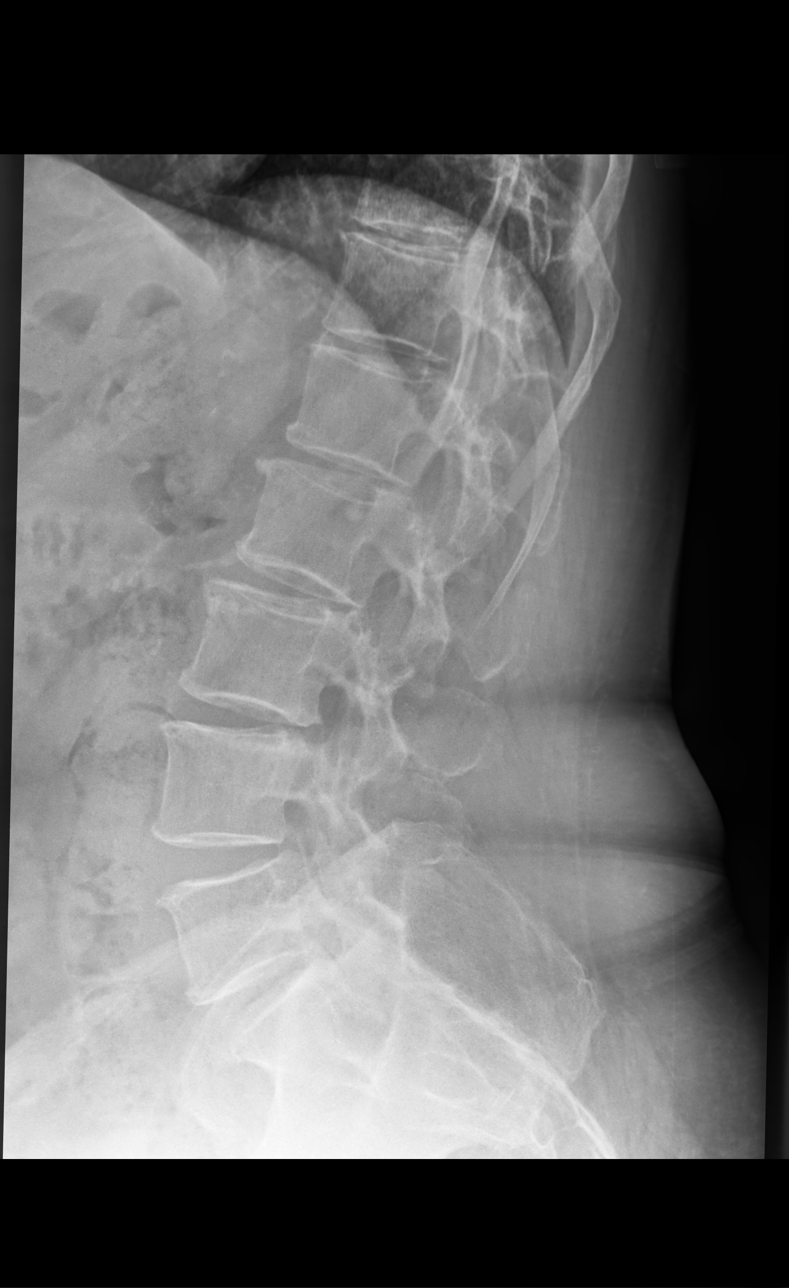

[lumbar spine lat (2 of 2)]
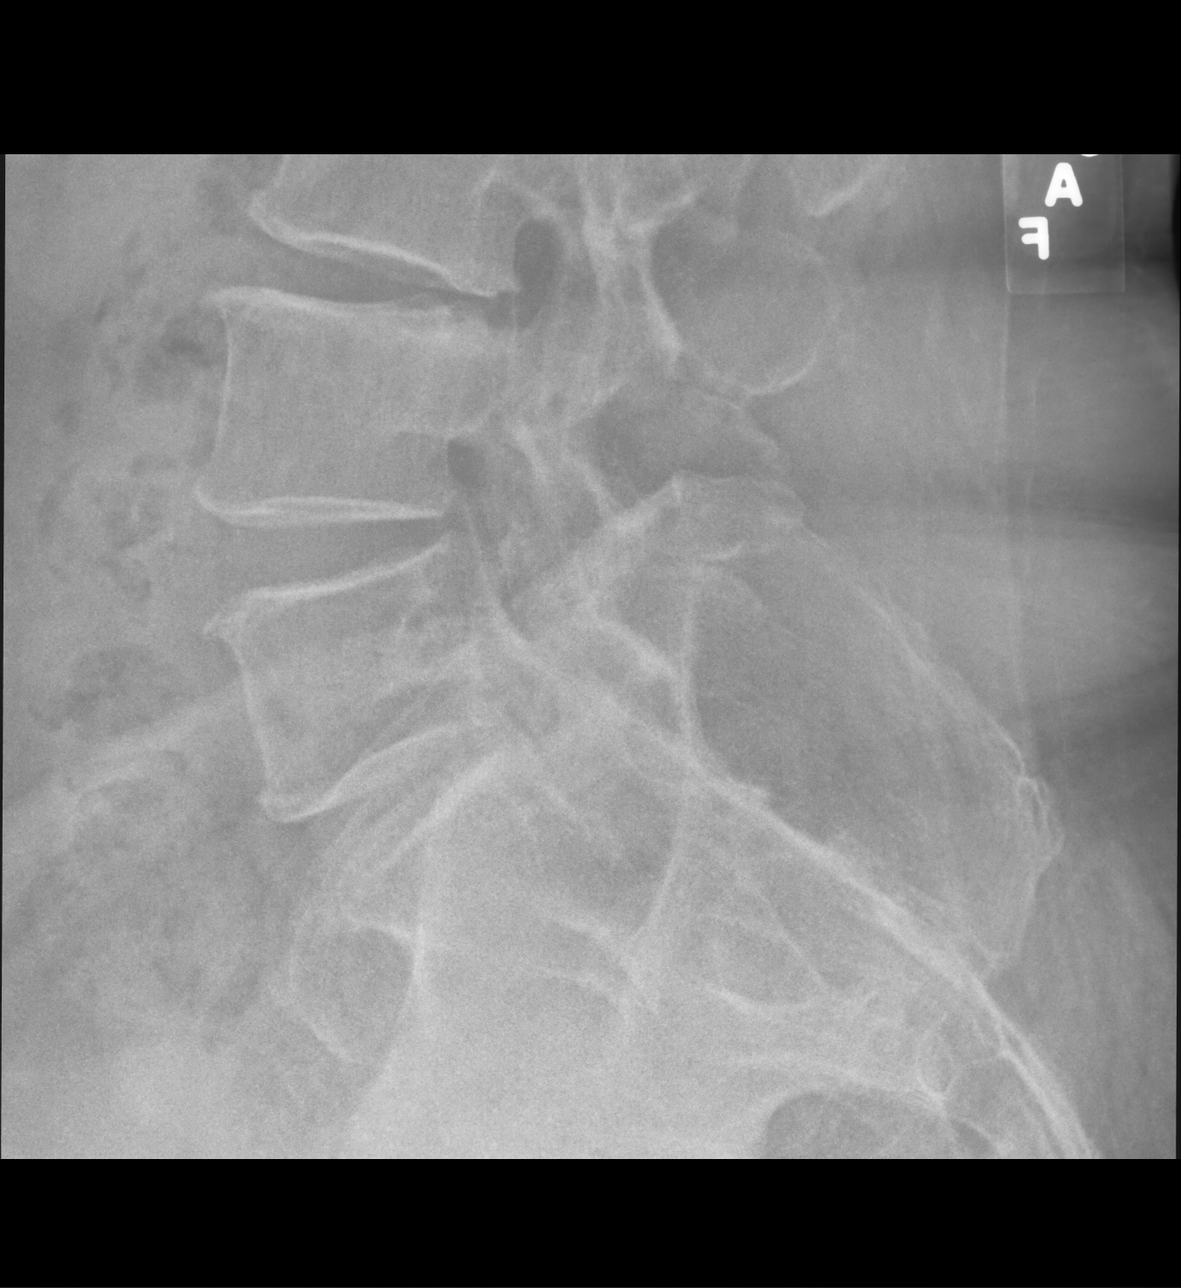

[5 of 5 positions shown; findings below may reference images not displayed]

FINDINGS: Degenerative disc and facet disease throughout the lumbar spine.
Normal alignment. No fracture. SI joints are symmetric and
unremarkable.
IMPRESSION: Degenerative disc and facet disease diffusely. No acute bony
abnormality.

## 2019-03-17 ENCOUNTER — Telehealth: Payer: Self-pay | Admitting: *Deleted

## 2019-03-17 ENCOUNTER — Ambulatory Visit: Payer: Self-pay | Admitting: *Deleted

## 2019-03-17 NOTE — Telephone Encounter (Signed)
Forwarding to Dr. Yong Channel as Juluis Rainier and to see if he has any other concerns or recommendations for pt.

## 2019-03-17 NOTE — Telephone Encounter (Signed)
Patient was visiting her son 2 weeks ago and she has been informed some one they visited has tested positive for COVID. Patient does not have symptoms and she is 14 days from the visit. Assured patient that she is past the point of spreading COVID - even if she was asymptomatic. Answered all questions about need for isolation and testing ans at this point- unless she should have fruther exposure- she is 2 weeks out and should be fine to continue on with her daily life. She is instructed to contact PCP for changes or other concerns and she assures she will- thankful for all information and if PCP feels she need additional information or testing we will contact her.

## 2019-03-17 NOTE — Telephone Encounter (Signed)
As she is 14 days out I agree- would only test if symptomatic at this point

## 2019-03-17 NOTE — Telephone Encounter (Signed)
See other telephone encounter.

## 2019-03-17 NOTE — Telephone Encounter (Signed)
   Reason for Disposition . [1] COVID-19 EXPOSURE AND [2] 15 or more days ago AND [3] NO symptoms  Answer Assessment - Initial Assessment Questions 1. CLOSE CONTACT: "Who is the person with the confirmed or suspected COVID-19 infection that you were exposed to?"     acquaintance of son 2. PLACE of CONTACT: "Where were you when you were exposed to COVID-19?" (e.g., home, school, medical waiting room; which city?)     Place of business 3. TYPE of CONTACT: "How much contact was there?" (e.g., sitting next to, live in same house, work in same office, same building)     Work place 4. DURATION of CONTACT: "How long were you in contact with the COVID-19 patient?" (e.g., a few seconds, passed by person, a few minutes, live with the patient)     15 minutes 5. DATE of CONTACT: "When did you have contact with a COVID-19 patient?" (e.g., how many days ago)     2 weeks ago 6. TRAVEL: "Have you traveled out of the country recently?" If so, "When and where?"     * Also ask about out-of-state travel, since the CDC has identified some high-risk cities for community spread in the [place].     * Note: Travel becomes less relevant if there is widespread community transmission where the patient lives.     no 7. COMMUNITY SPREAD: "Are there lots of cases of COVID-19 (community spread) where you live?" (See public health department website, if unsure)       minor 8. SYMPTOMS: "Do you have any symptoms?" (e.g., fever, cough, breathing difficulty)     no 9. PREGNANCY OR POSTPARTUM: "Is there any chance you are pregnant?" "When was your last menstrual period?" "Did you deliver in the last 2 weeks?"     n/a 10. HIGH RISK: "Do you have any heart or lung problems? Do you have a weak immune system?" (e.g., CHF, COPD, asthma, HIV positive, chemotherapy, renal failure, diabetes mellitus, sickle cell anemia)       High blood pressure  Protocols used: CORONAVIRUS (COVID-19) EXPOSURE-A-AH

## 2019-03-18 NOTE — Telephone Encounter (Signed)
Noted  

## 2019-04-06 ENCOUNTER — Other Ambulatory Visit: Payer: Self-pay | Admitting: Family Medicine

## 2019-04-14 ENCOUNTER — Other Ambulatory Visit: Payer: Self-pay | Admitting: Family Medicine

## 2019-05-06 ENCOUNTER — Ambulatory Visit: Payer: PPO

## 2019-05-26 ENCOUNTER — Encounter: Payer: Self-pay | Admitting: Physician Assistant

## 2019-05-26 ENCOUNTER — Ambulatory Visit (INDEPENDENT_AMBULATORY_CARE_PROVIDER_SITE_OTHER): Payer: PPO | Admitting: Physician Assistant

## 2019-05-26 VITALS — BP 146/70 | HR 55 | Temp 98.0°F | Ht 63.0 in | Wt 170.0 lb

## 2019-05-26 DIAGNOSIS — H6123 Impacted cerumen, bilateral: Secondary | ICD-10-CM

## 2019-05-26 NOTE — Progress Notes (Signed)
Tammie [name] is a 84 y.o. female here for a new problem.  I acted as a Education administrator for [employer], PA-C Anselmo Pickler, LPN  History of Present Illness:   Chief Complaint  Patient presents with  . Otalgia    HPI   Otalgia Pt c/o Lt ear pain x 3 weeks, headaches off and on. Had some vertigo over the weekend, did not need Meclizine. Denies fever or chills, no drainage. Is having post nasal drip takes Clarinex daily. Has required ear lavage in remote past, approximately 10 years ago or so  Past Medical History:  Diagnosis Date  . Abnormal uterine bleeding   . Allergic rhinitis   . Allergy    seasonal  . Anxiety    panic attacks  . Anxiety disorder   . Asthma   . Cataract    left eye surgery to removed  . Cataract    right eye  . Colorectal cancer ([place]) 1997   T3, N0  . Depression    pt states she isn't depressed anymore, continues paxil daily  . Endometriosis   . Fibroid   . GERD (gastroesophageal reflux disease)   . Hemorrhoids   . Hyperlipidemia    diet controlled, no meds  . Hypertension   . PONV (postoperative nausea and vomiting)   . Prediabetes   . Seasonal allergies   . SVD (spontaneous vaginal delivery)    x 3  . Thyroid disease   . Urinary incontinence      Social History   Socioeconomic History  . Marital status: Divorced    Spouse name: Not on file  . Number of children: Not on file  . Years of education: Not on file  . Highest education level: Not on file  Occupational History  . Not on file  Social Needs  . Financial resource strain: Not on file  . Food insecurity    Worry: Not on file    Inability: Not on file  . Transportation needs    Medical: Not on file    Non-medical: Not on file  Tobacco Use  . Smoking status: Never Smoker  . Smokeless tobacco: Never Used  Substance and Sexual Activity  . Alcohol use: Yes    Alcohol/week: 0.0 standard drinks    Comment: occasional wine  . Drug use: No  . Sexual activity: Not Currently     Partners: Male    Birth control/protection: Post-menopausal  Lifestyle  . Physical activity    Days per week: Not on file    Minutes per session: Not on file  . Stress: Not on file  Relationships  . Social Herbalist on phone: Not on file    Gets together: Not on file    Attends religious service: Not on file    Active member of club or organization: Not on file    Attends meetings of clubs or organizations: Not on file    Relationship status: Not on file  . Intimate partner violence    Fear of current or ex partner: Not on file    Emotionally abused: Not on file    Physically abused: Not on file    Forced sexual activity: Not on file  Other Topics Concern  . Not on file  Social History Narrative   Divorced. Lives alone.  Two children. 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 cataract surgery Left   . COLECTOMY  1997   SIGMOID COLECTOMY 1997  . COLON SURGERY  1997  . COLONOSCOPY    . DILATATION & CURETTAGE/HYSTEROSCOPY WITH MYOSURE N/A 11/21/2015   Procedure: DILATATION & CURETTAGE/HYSTEROSCOPY WITH MYOSURE;  Surgeon: Salvadore Dom, MD;  Location: [facility];  Service: Gynecology;  Laterality: N/A;  . DILATION AND CURETTAGE OF UTERUS     D & C  . POLYPECTOMY    . TONSILLECTOMY      Family History  Problem Relation Age of Onset  . Sudden death Brother 30       Apparent DVT/PE. all males  . Heart attack Brother   . Varicose Veins Mother   . Hypertension Father   . Sudden death Cousin 67       Paternal cousin -dvt/pe 7 all males  . Coronary artery disease Cousin 90       Paternal cousin, MI  . Breast cancer Daughter 93  . Stroke Maternal Grandmother   . Stroke Maternal Grandfather   . Early death  Paternal Grandmother   . Tuberculosis Paternal Grandmother   . Early death Paternal Grandfather   . Tuberculosis Paternal Grandfather   . Hypertension Son   . Colon cancer Neg Hx   . Rectal cancer Neg Hx   . Stomach cancer Neg Hx     Allergies  Allergen Reactions  . Erythromycin     unknown  . Fentanyl Hives  . Heparin     questionable, unknown  . Hydrocodone-Acetaminophen Hives  . Meperidine Hcl     unknown  . Penicillins     Has patient had a PCN reaction causing immediate rash, facial/tongue/throat swelling, SOB or lightheadedness with hypotension: no Has patient had a PCN reaction causing severe rash involving mucus membranes or skin necrosis: no Has patient had a PCN reaction that required hospitalization no Has patient had a PCN reaction occurring within the last 10 years: no If all of the above answers are "NO", then may proceed with Cephalosporin use.   . Procaine Hcl     Could not sleep  . Promethazine Hcl     unknown  . Temazepam Hives    unknown    Current Medications:   Current Outpatient Medications:  .  albuterol (VENTOLIN HFA) 108 (90 Base) MCG/ACT inhaler, Inhale 2 puffs into the lungs every 6 hours as needed for wheezing or shortness of breath., Disp: 8.5 g, Rfl: 2 .  amLODipine (NORVASC) 10 MG tablet, Take 1 tablet (10 mg total) by mouth daily., Disp: 90 tablet, Rfl: 3 .  budesonide-formoterol (SYMBICORT) 160-4.5 MCG/ACT inhaler, INHALE 2 PUFFS INTO THE LUNGS 2 TIMES DAILY, Disp: 10.2 g, Rfl: 5 .  clobetasol (TEMOVATE) 0.05 % external solution, Apply 1 application topically 2 (two) times daily. For 7-10 days up to twice a day maximum. Use only on scalp, Disp: 50 mL, Rfl: 0 .  desloratadine (CLARINEX) 5 MG tablet, TAKE 1 TABLET BY MOUTH EVERY DAY, Disp: 30 tablet, Rfl: 5 .  hydrocortisone (ANUSOL-HC) 2.5 % rectal cream, Place 1 application rectally 2 times daily as needed for hemorrhoids., Disp: 28.35 g, Rfl: 1 .  lisinopril-hydrochlorothiazide  (ZESTORETIC) 20-25 MG tablet, TAKE 1 TABLET BY MOUTH EVERY DAY, Disp: 90 tablet, Rfl: 1 .  meclizine (ANTIVERT) 25 MG tablet, TAKE 1  TABLET BY MOUTH 2 TIMES DAILY AS NEEDED FOR vertigo OR dizziness., Disp: 30 tablet, Rfl: 0 .  omeprazole (PRILOSEC) 40 MG capsule, TAKE 1 CAPSULE BY MOUTH EVERY DAY, Disp: 90 capsule, Rfl: 1 .  PARoxetine (PAXIL) 20 MG tablet, TAKE 1 TABLET BY MOUTH EVERY DAY, Disp: 90 tablet, Rfl: 1   Review of Systems:   ROS  Negative unless otherwise specified per HPI.  Vitals:   Vitals:   05/26/19 1042  BP: (!) 146/70  Pulse: (!) 55  Temp: 98 F (36.7 C)  TempSrc: Temporal  SpO2: 96%  Weight: 170 lb (77.1 kg)  Height: 5\' 3"  (1.6 m)     Body mass index is 30.11 kg/m.  Physical Exam:   Physical Exam Vitals signs and nursing note reviewed.  Constitutional:      General: She is not in acute distress.    Appearance: She is well-developed. She is not ill-appearing or toxic-appearing.  HENT:     Head: Normocephalic and atraumatic.     Right Ear: Ear canal and external ear normal. There is impacted cerumen.     Left Ear: Ear canal and external ear normal. There is impacted cerumen.     Nose: Nose normal.     Right Sinus: No maxillary sinus tenderness or frontal sinus tenderness.     Left Sinus: No maxillary sinus tenderness or frontal sinus tenderness.     Mouth/Throat:     Pharynx: Uvula midline. No posterior oropharyngeal erythema.  Eyes:     General: Lids are normal.     Conjunctiva/sclera: Conjunctivae normal.  Neck:     Trachea: Trachea normal.  Cardiovascular:     Rate and Rhythm: Normal rate and regular rhythm.     Heart sounds: Normal heart sounds, S1 normal and S2 normal.  Pulmonary:     Effort: Pulmonary effort is normal.     Breath sounds: Normal breath sounds. No decreased breath sounds, wheezing, rhonchi or rales.  Lymphadenopathy:     Cervical: No cervical adenopathy.  Skin:    General: Skin is warm and dry.  Neurological:     Mental  Status: She is alert.  Psychiatric:        Speech: Speech normal.        Behavior: Behavior normal. Behavior is cooperative.    Procedure: Cerumen Disimpaction Warm water was applied and gentle ear lavage performed bilaterally. There were no complications and following the disimpaction the tympanic membrane was visible on the bilateral. Tympanic membranes are intact following the procedure.  Auditory canals are normal.  The patient reported relief of symptoms after removal of cerumen.    Assessment and Plan:   Zilla was seen today for otalgia.  Diagnoses and all orders for this visit:  Bilateral impacted cerumen   Tolerated procedure well. Symptoms resolved with lavage. Worsening precautions advised.  . Reviewed expectations re: course of current medical issues. . Discussed self-management of symptoms. . Outlined signs and symptoms indicating need for more acute intervention. . Patient verbalized understanding and all questions were answered. . See orders for this visit as documented in the electronic medical record. . Patient received an After-Visit Summary.  CMA or LPN served as scribe during this visit. History, Physical, and Plan performed by medical provider. The above documentation has been reviewed and is accurate and complete.   Inda Coke, PA-C

## 2019-06-23 ENCOUNTER — Ambulatory Visit: Payer: PPO

## 2019-06-25 ENCOUNTER — Encounter: Payer: Self-pay | Admitting: Physician Assistant

## 2019-06-25 ENCOUNTER — Ambulatory Visit (INDEPENDENT_AMBULATORY_CARE_PROVIDER_SITE_OTHER): Payer: PPO | Admitting: Physician Assistant

## 2019-06-25 DIAGNOSIS — M79671 Pain in right foot: Secondary | ICD-10-CM

## 2019-06-25 NOTE — Progress Notes (Signed)
Virtual Visit via Video   I connected with Tammie [name] on 06/25/19 at 10:00 AM EDT by a video enabled telemedicine application and verified that I am speaking with the correct person using two identifiers. Location patient: Home Location provider: [facility], Office Persons participating in the virtual visit: [place], Dutt PA-C, Anselmo Pickler, LPN   I discussed the limitations of evaluation and management by telemedicine and the availability of in person appointments. The patient expressed understanding and agreed to proceed.  I acted as a Education administrator for [employer], [employer], LPN  Subjective:   HPI:   Foot pain Pt c/o right foot pain ball of foot below toes. Pt said she was on her feet all day on Tuesday walking back and forth a lot. Pt said she rested yesterday and today feels better. Pt has had trouble for years with her feet. She states that she has never seen a podiatrist.  Does have a history of her A1c being 6.8 approx 11 months ago, but now it is down to 5.8.  She reports that when she was doing her cleaning in her house she was wearing her Birkenstocks.  She has never purchased orthotics or other inserts for her shoes, and is wondering if she should do this today.  Denies severe pain, numbness, tingling.  She has not tried ice or anything like that, but does have a foot bath that she has that she can use if needed.  ROS: See pertinent positives and negatives per HPI.  Patient Active Problem List   Diagnosis Date Noted  . Prediabetes   . Hyperlipidemia 01/15/2018  . Hyperglycemia 07/15/2017  . Vertigo 07/15/2017  . Left leg pain 12/20/2016  . Hemorrhoids 12/20/2016  . Dermatitis 12/20/2016  . Osteopenia 10/02/2015  . Systolic murmur [id number]  . Hypertension   . Panic attacks   . History of colorectal cancer   . Allergic rhinitis   . Asthma   . GERD 06/22/2009    Social History   Tobacco Use  . Smoking status: Never  Smoker  . Smokeless tobacco: Never Used  Substance Use Topics  . Alcohol use: Yes    Alcohol/week: 0.0 standard drinks    Comment: occasional wine    Current Outpatient Medications:  .  albuterol (VENTOLIN HFA) 108 (90 Base) MCG/ACT inhaler, Inhale 2 puffs into the lungs every 6 hours as needed for wheezing or shortness of breath., Disp: 8.5 g, Rfl: 2 .  amLODipine (NORVASC) 10 MG tablet, Take 1 tablet (10 mg total) by mouth daily., Disp: 90 tablet, Rfl: 3 .  budesonide-formoterol (SYMBICORT) 160-4.5 MCG/ACT inhaler, INHALE 2 PUFFS INTO THE LUNGS 2 TIMES DAILY, Disp: 10.2 g, Rfl: 5 .  clobetasol (TEMOVATE) 0.05 % external solution, Apply 1 application topically 2 (two) times daily. For 7-10 days up to twice a day maximum. Use only on scalp, Disp: 50 mL, Rfl: 0 .  desloratadine (CLARINEX) 5 MG tablet, TAKE 1 TABLET BY MOUTH EVERY DAY, Disp: 30 tablet, Rfl: 5 .  hydrocortisone (ANUSOL-HC) 2.5 % rectal cream, Place 1 application rectally 2 times daily as needed for hemorrhoids., Disp: 28.35 g, Rfl: 1 .  lisinopril-hydrochlorothiazide (ZESTORETIC) 20-25 MG tablet, TAKE 1 TABLET BY MOUTH EVERY DAY, Disp: 90 tablet, Rfl: 1 .  meclizine (ANTIVERT) 25 MG tablet, TAKE 1 TABLET BY MOUTH 2 TIMES DAILY AS NEEDED FOR vertigo OR dizziness., Disp: 30 tablet, Rfl: 0 .  omeprazole (PRILOSEC) 40 MG capsule, TAKE 1 CAPSULE BY  MOUTH EVERY DAY, Disp: 90 capsule, Rfl: 1 .  PARoxetine (PAXIL) 20 MG tablet, TAKE 1 TABLET BY MOUTH EVERY DAY, Disp: 90 tablet, Rfl: 1  Allergies  Allergen Reactions  . Erythromycin     unknown  . Fentanyl Hives  . Heparin     questionable, unknown  . Hydrocodone-Acetaminophen Hives  . Meperidine Hcl     unknown  . Penicillins     Has patient had a PCN reaction causing immediate rash, facial/tongue/throat swelling, SOB or lightheadedness with hypotension: no Has patient had a PCN reaction causing severe rash involving mucus membranes or skin necrosis: no Has patient had a PCN  reaction that required hospitalization no Has patient had a PCN reaction occurring within the last 10 years: no If all of the above answers are "NO", then may proceed with Cephalosporin use.   . Procaine Hcl     Could not sleep  . Promethazine Hcl     unknown  . Temazepam Hives    unknown    Objective:   VITALS: Per patient if applicable, see vitals. GENERAL: Alert, appears well and in no acute distress. HEENT: Atraumatic, conjunctiva clear, no obvious abnormalities on inspection of external nose and ears. NECK: Normal movements of the head and neck. CARDIOPULMONARY: No increased WOB. Speaking in clear sentences. I:E ratio WNL.  MS: Moves all visible extremities without noticeable abnormality. PSYCH: Pleasant and cooperative, well-groomed. Speech normal rate and rhythm. Affect is appropriate. Insight and judgement are appropriate. Attention is focused, linear, and appropriate.  NEURO: CN grossly intact. Oriented as arrived to appointment on time with no prompting. Moves both UE equally.  SKIN: No obvious lesions, wounds, erythema, or cyanosis noted on face or hands.  Assessment and Plan:   Tammie [name] was seen today for foot pain.  Diagnoses and all orders for this visit:  Right foot pain -     Ambulatory referral to Podiatry   Unclear etiology as we try to examine this virtually.  I did recommend that we send her to podiatry as she has been having foot issues for some time now.  I did recommend that she try to avoid any heavy activity with her foot.  If she has worsening or return of pain I did recommend that we get an x-ray in the interim.  Patient verbalized understanding to plan.   . Reviewed expectations re: course of current medical issues. . Discussed self-management of symptoms. . Outlined signs and symptoms indicating need for more acute intervention. . Patient verbalized understanding and all questions were answered. [name] Health Maintenance issues including appropriate  healthy diet, exercise, and smoking avoidance were discussed with patient. . See orders for this visit as documented in the electronic medical record.  I discussed the assessment and treatment plan with the patient. The patient was provided an opportunity to ask questions and all were answered. The patient agreed with the plan and demonstrated an understanding of the instructions.   The patient was advised to call back or seek an in-person evaluation if the symptoms worsen or if the condition fails to improve as anticipated.   CMA or LPN served as scribe during this visit. History, Physical, and Plan performed by medical provider. The above documentation has been reviewed and is accurate and complete.  Inda Coke, [place] 06/25/2019

## 2019-07-02 DIAGNOSIS — L218 Other seborrheic dermatitis: Secondary | ICD-10-CM | POA: Diagnosis not present

## 2019-07-02 DIAGNOSIS — I872 Venous insufficiency (chronic) (peripheral): Secondary | ICD-10-CM | POA: Diagnosis not present

## 2019-07-02 DIAGNOSIS — L821 Other seborrheic keratosis: Secondary | ICD-10-CM | POA: Diagnosis not present

## 2019-07-02 DIAGNOSIS — D225 Melanocytic nevi of trunk: Secondary | ICD-10-CM | POA: Diagnosis not present

## 2019-07-06 ENCOUNTER — Ambulatory Visit (INDEPENDENT_AMBULATORY_CARE_PROVIDER_SITE_OTHER): Payer: PPO

## 2019-07-06 ENCOUNTER — Other Ambulatory Visit: Payer: Self-pay

## 2019-07-06 ENCOUNTER — Encounter: Payer: Self-pay | Admitting: Urology

## 2019-07-06 ENCOUNTER — Ambulatory Visit: Payer: PPO | Admitting: Sports Medicine

## 2019-07-06 ENCOUNTER — Encounter: Payer: Self-pay | Admitting: Sports Medicine

## 2019-07-06 ENCOUNTER — Other Ambulatory Visit: Payer: Self-pay | Admitting: Sports Medicine

## 2019-07-06 VITALS — BP 153/66 | HR 56 | Resp 16

## 2019-07-06 DIAGNOSIS — M5430 Sciatica, unspecified side: Secondary | ICD-10-CM

## 2019-07-06 DIAGNOSIS — M79672 Pain in left foot: Secondary | ICD-10-CM

## 2019-07-06 DIAGNOSIS — M79671 Pain in right foot: Secondary | ICD-10-CM | POA: Diagnosis not present

## 2019-07-06 DIAGNOSIS — M19079 Primary osteoarthritis, unspecified ankle and foot: Secondary | ICD-10-CM

## 2019-07-06 DIAGNOSIS — M779 Enthesopathy, unspecified: Secondary | ICD-10-CM

## 2019-07-06 DIAGNOSIS — M792 Neuralgia and neuritis, unspecified: Secondary | ICD-10-CM

## 2019-07-06 NOTE — Progress Notes (Signed)
Subjective: Tammie [name] is a 84 y.o. female patient who presents to office for evaluation of right foot pain. Patient complains of progressive pain especially over the last 2 weeks over the right lateral foot patient reports that she also suffers with numbness has sciatic problems and feels like there are rubber bands on the bottom of her feet feels like it is better when she wears her sneakers and was told that the numbness was due to to her sciatica but reports that the new pain along the side of the foot has slowly gotten better with rest elevation changing shoes but wanted to have the area checked.  Patient ranks pain 2-3 out of 10.  Denies injury/trip/fall/sprain/any causative factors.    Review of Systems  All other systems reviewed and are negative.   Patient Active Problem List   Diagnosis Date Noted  . Prediabetes   . Hyperlipidemia 01/15/2018  . Hyperglycemia 07/15/2017  . Vertigo 07/15/2017  . Left leg pain 12/20/2016  . Hemorrhoids 12/20/2016  . Dermatitis 12/20/2016  . Osteopenia 10/02/2015  . Systolic murmur [id number]  . Hypertension   . Panic attacks   . History of colorectal cancer   . Allergic rhinitis   . Asthma   . GERD 06/22/2009    Current Outpatient Medications on File Prior to Visit  Medication Sig Dispense Refill  . albuterol (VENTOLIN HFA) 108 (90 Base) MCG/ACT inhaler Inhale 2 puffs into the lungs every 6 hours as needed for wheezing or shortness of breath. 8.5 g 2  . amLODipine (NORVASC) 10 MG tablet Take 1 tablet (10 mg total) by mouth daily. 90 tablet 3  . augmented betamethasone dipropionate (DIPROLENE-AF) 0.05 % cream apply topically TO affected AREAS UP TO 2 TIMES DAILY AS NEEDED. AVOID FACE, GROIN, AND axilia    . budesonide-formoterol (SYMBICORT) 160-4.5 MCG/ACT inhaler INHALE 2 PUFFS INTO THE LUNGS 2 TIMES DAILY 10.2 g 5  . clobetasol (TEMOVATE) 0.05 % external solution Apply 1 application topically 2 (two) times daily. For 7-10 days up to  twice a day maximum. Use only on scalp 50 mL 0  . desloratadine (CLARINEX) 5 MG tablet TAKE 1 TABLET BY MOUTH EVERY DAY 30 tablet 5  . hydrocortisone (ANUSOL-HC) 2.5 % rectal cream Place 1 application rectally 2 times daily as needed for hemorrhoids. 28.35 g 1  . lisinopril-hydrochlorothiazide (ZESTORETIC) 20-25 MG tablet TAKE 1 TABLET BY MOUTH EVERY DAY 90 tablet 1  . meclizine (ANTIVERT) 25 MG tablet TAKE 1 TABLET BY MOUTH 2 TIMES DAILY AS NEEDED FOR vertigo OR dizziness. 30 tablet 0  . omeprazole (PRILOSEC) 40 MG capsule TAKE 1 CAPSULE BY MOUTH EVERY DAY 90 capsule 1  . PARoxetine (PAXIL) 20 MG tablet TAKE 1 TABLET BY MOUTH EVERY DAY 90 tablet 1   No current facility-administered medications on file prior to visit.     Allergies  Allergen Reactions  . Erythromycin     unknown  . Fentanyl Hives  . Heparin     questionable, unknown  . Hydrocodone-Acetaminophen Hives  . Meperidine Hcl     unknown  . Penicillins     Has patient had a PCN reaction causing immediate rash, facial/tongue/throat swelling, SOB or lightheadedness with hypotension: no Has patient had a PCN reaction causing severe rash involving mucus membranes or skin necrosis: no Has patient had a PCN reaction that required hospitalization no Has patient had a PCN reaction occurring within the last 10 years: no If all of the above answers are "NO", then  may proceed with Cephalosporin use.   . Procaine Hcl     Could not sleep  . Promethazine Hcl     unknown  . Temazepam Hives    unknown    Objective:  General: Alert and oriented x3 in no acute distress  Dermatology: No open lesions bilateral lower extremities, no webspace macerations, no ecchymosis bilateral, all nails x 10 are well manicured.  Vascular: Dorsalis Pedis and Posterior Tibial pedal pulses palpable, Capillary Fill Time 3 seconds in all digits.  Neurology: Gross sensation intact via light touch bilateral, numbness to all toes bilateral likely secondary  to radiculopathy versus sciatic history.  Musculoskeletal: Minimal reproducible tenderness to palpation at the plantar lateral right foot. Strength within normal limits in all groups bilateral.   Gait: Non-Antalgic gait  Xrays  Right foot   Impression: Diffuse arthritis but no other acute findings no fracture or dislocation  Assessment and Plan: Problem List Items Addressed This Visit    None    Visit Diagnoses    Pain in both feet    -  Primary   Relevant Orders   DG Foot Complete Left (Completed)   Capsulitis       Tendonitis       Neuritis       Sciatica, unspecified laterality       Arthritis of foot           -Complete examination performed -Xrays reviewed -Discussed treatment options for capsulitis versus tendinitis and arthritis in the setting of neuritis secondary to sciatica versus radiculopathy -Patient declined injection at this visit -Dispensed compression sleeves for patient to wear to provide additional support and encourage patient to wear good supportive shoes daily -Continue with rest ice elevation and topical pain creams and rubs as needed -Patient to return to office as needed or sooner if condition worsens.  Landis Martins, DPM

## 2019-07-06 NOTE — Patient Instructions (Signed)
Tendonitis and arthritis Continue with CBD balm, rest, ice, soaking, and supportive sleeves and aleve as needed until pain is better

## 2019-07-14 ENCOUNTER — Ambulatory Visit: Payer: PPO | Admitting: Podiatry

## 2019-07-22 ENCOUNTER — Telehealth: Payer: Self-pay | Admitting: Family Medicine

## 2019-07-22 NOTE — Telephone Encounter (Signed)
See note

## 2019-07-22 NOTE — Telephone Encounter (Signed)
Pt would like to confirm appt.

## 2019-07-22 NOTE — Telephone Encounter (Signed)
I left a message asking the patient to call and schedule Medicare AWV with Loma Sousa ([place]) on 07/27/2019 after seeing Dr. Yong Channel.  Im waiting for a call back to either confirm or decline the appointment. If patient calls back, please update appointment notes.  VDM ([place])

## 2019-07-27 ENCOUNTER — Ambulatory Visit (INDEPENDENT_AMBULATORY_CARE_PROVIDER_SITE_OTHER): Payer: PPO | Admitting: Family Medicine

## 2019-07-27 ENCOUNTER — Encounter: Payer: Self-pay | Admitting: Family Medicine

## 2019-07-27 ENCOUNTER — Telehealth: Payer: Self-pay | Admitting: Family Medicine

## 2019-07-27 ENCOUNTER — Other Ambulatory Visit: Payer: Self-pay

## 2019-07-27 ENCOUNTER — Ambulatory Visit (INDEPENDENT_AMBULATORY_CARE_PROVIDER_SITE_OTHER): Payer: PPO

## 2019-07-27 VITALS — BP 146/78 | Temp 98.3°F | Ht 63.0 in | Wt 171.1 lb

## 2019-07-27 VITALS — BP 146/68 | HR 57 | Temp 98.3°F | Ht 63.0 in | Wt 171.0 lb

## 2019-07-27 DIAGNOSIS — K219 Gastro-esophageal reflux disease without esophagitis: Secondary | ICD-10-CM

## 2019-07-27 DIAGNOSIS — Z0000 Encounter for general adult medical examination without abnormal findings: Secondary | ICD-10-CM

## 2019-07-27 DIAGNOSIS — E785 Hyperlipidemia, unspecified: Secondary | ICD-10-CM

## 2019-07-27 DIAGNOSIS — R739 Hyperglycemia, unspecified: Secondary | ICD-10-CM | POA: Diagnosis not present

## 2019-07-27 DIAGNOSIS — I10 Essential (primary) hypertension: Secondary | ICD-10-CM | POA: Diagnosis not present

## 2019-07-27 DIAGNOSIS — Z79899 Other long term (current) drug therapy: Secondary | ICD-10-CM | POA: Diagnosis not present

## 2019-07-27 DIAGNOSIS — J4540 Moderate persistent asthma, uncomplicated: Secondary | ICD-10-CM

## 2019-07-27 DIAGNOSIS — F411 Generalized anxiety disorder: Secondary | ICD-10-CM

## 2019-07-27 MED ORDER — OMEPRAZOLE 20 MG PO CPDR: 20.0000 mg | DELAYED_RELEASE_CAPSULE | Freq: Every day | ORAL | 3 refills | Status: DC

## 2019-07-27 MED ORDER — PAROXETINE HCL 20 MG PO TABS: 20.0000 mg | ORAL_TABLET | Freq: Every day | ORAL | 1 refills | Status: DC

## 2019-07-27 NOTE — Telephone Encounter (Signed)
See request °

## 2019-07-27 NOTE — Progress Notes (Signed)
Phone [phone] In person visit   Subjective:   Tammie [name] is a 84 y.o. year old very pleasant female patient who presents for/with See problem oriented charting Chief Complaint  Patient presents with  . Hypertension   ROS- Review of Systems  Constitutional: Negative.   HENT: Negative.   Eyes: Negative.   Respiratory: Positive for cough and wheezing.   Cardiovascular: Negative.   Gastrointestinal: Negative.   Genitourinary: Negative.   Musculoskeletal: Negative.   Skin: Negative.   Neurological: Negative.   Endo/Heme/Allergies: Negative.   Psychiatric/Behavioral: Negative.      Past Medical History-  Patient Active Problem List   Diagnosis Date Noted  . Hyperglycemia 07/15/2017    Priority: High  . Osteopenia 10/02/2015    Priority: High  . Hyperlipidemia 01/15/2018    Priority: Medium  . Vertigo 07/15/2017    Priority: Medium  . Hypertension     Priority: Medium  . Panic attacks     Priority: Medium  . History of colorectal cancer     Priority: Medium  . Asthma     Priority: Medium  . Left leg pain 12/20/2016    Priority: Low  . Hemorrhoids 12/20/2016    Priority: Low  . Dermatitis 12/20/2016    Priority: Low  . Systolic murmur [id number]    Priority: Low  . Allergic rhinitis     Priority: Low  . GERD 06/22/2009    Priority: Low  . Prediabetes     Medications- reviewed and updated Current Outpatient Medications  Medication Sig Dispense Refill  . albuterol (VENTOLIN HFA) 108 (90 Base) MCG/ACT inhaler Inhale 2 puffs into the lungs every 6 hours as needed for wheezing or shortness of breath. 8.5 g 2  . amLODipine (NORVASC) 10 MG tablet Take 1 tablet (10 mg total) by mouth daily. 90 tablet 3  . augmented betamethasone dipropionate (DIPROLENE-AF) 0.05 % cream apply topically TO affected AREAS UP TO 2 TIMES DAILY AS NEEDED. AVOID FACE, GROIN, AND axilia    . budesonide-formoterol (SYMBICORT) 160-4.5 MCG/ACT inhaler INHALE 2 PUFFS INTO THE LUNGS 2  TIMES DAILY 10.2 g 5  . clobetasol (TEMOVATE) 0.05 % external solution Apply 1 application topically 2 (two) times daily. For 7-10 days up to twice a day maximum. Use only on scalp 50 mL 0  . desloratadine (CLARINEX) 5 MG tablet TAKE 1 TABLET BY MOUTH EVERY DAY 30 tablet 5  . hydrocortisone (ANUSOL-HC) 2.5 % rectal cream Place 1 application rectally 2 times daily as needed for hemorrhoids. 28.35 g 1  . lisinopril-hydrochlorothiazide (ZESTORETIC) 20-25 MG tablet TAKE 1 TABLET BY MOUTH EVERY DAY 90 tablet 1  . meclizine (ANTIVERT) 25 MG tablet TAKE 1 TABLET BY MOUTH 2 TIMES DAILY AS NEEDED FOR vertigo OR dizziness. 30 tablet 0  . omeprazole (PRILOSEC) 20 MG capsule Take 1 capsule (20 mg total) by mouth daily. 90 capsule 3  . PARoxetine (PAXIL) 20 MG tablet Take 1 tablet (20 mg total) by mouth daily. 90 tablet 1   No current facility-administered medications for this visit.      Objective:  BP (!) 146/68   Pulse (!) 57   Temp 98.3 F (36.8 C) (Temporal)   Ht 5\' 3"  (1.6 m)   Wt 171 lb (77.6 kg)   SpO2 94%   BMI 30.29 kg/m  Gen: NAD, resting comfortably CV: RRR no murmurs rubs or gallops Lungs: CTAB no crackles, wheeze, rhonchi Abdomen: soft/nontender/nondistended Ext: trace edema Skin: warm, dry    Assessment and  Plan   #social update- daughter building home in [place] and they will have a room for her- so she can spend time there (not moving there)   #hypertension S: She monitors at home about once a week. Most of the time mid 140s/65.  It can be from 131/65 to 165/70. She has been taking her  amlodipine 10 mg and Lisinopril hydrochlorothiazide 20-25 mg every morning. She has not missed dose.   Review: taking medications as instructed, no medication side effects noted, no TIAs, no chest pain on exertion, no dyspnea on exertion, no swelling of ankles. Smoker: No.  Some stress eating- has regained weight BP Readings from Last 3 Encounters:  07/27/19 (!) 146/68  07/06/19 (!)  153/66  A/P: on repeat improved. Per goal per [facility] per her age. We opted to continue current medicines. She is going to try to work on lifestyle to get levels down lower- also discussed watching salt in diet. She states could cut down on triscuit crackers in Am due to level of salt.    #Asthma S: She has had increased issues with asthma recently at least since she has started back on Symbicort- had started wheezing at night again/not much cough. Using albuterol - has not had to use in a long time  A/P: reasonable control- continue current medicine.  No wheeze today   #Hyperglycemia/insulin resistance/prediabetes S: Significant improvement in A1c last visit with lifestyle changes patient had lost 11 pounds at that time.  Has gained 5 pound since that time   Lab Results  Component Value Date   HGBA1C 5.8 01/11/2019   HGBA1C 6.8 (H) 07/22/2018   HGBA1C 6.1 07/15/2017   A/P: hopefully controlled- she will come back for a1c testing   #hyperlipidemia S: no statin for primary prevention given over age 48 Lab Results  Component Value Date   CHOL 168 07/22/2018   HDL 31.20 (L) 07/22/2018   LDLCALC 105 (H) 07/22/2018   TRIG 161.0 (H) 07/22/2018   CHOLHDL 5 07/22/2018   A/P: she still would like to check on cholesterol levels and we can focus on lifestyle changes to improve #s as long as no heart attack or stroke.    #Long-term PPI-we will check B12.  Willing to try 20mg  prilosec- will send in. Can go back up if needed.   # panic attacks- some anxiety but no full panic attacks (taking only 10mg - half tablet). More stress with covid and not being able to go to the pool. Encouraged exercise to help manage anxiety. Refill paxil since keeping panic attacks away. Journal/writing  # plans to walk on her own back porch  #uses cbd oil on her knees  Recommended follow up: 6 months for physical  Lab/Order associations:   ICD-10-CM   1. Essential hypertension  I10   2. Moderate persistent asthma  without complication  123456   3. Hyperlipidemia, unspecified hyperlipidemia type  E78.5 CBC with Differential    Comprehensive metabolic panel    Lipid panel  4. Hyperglycemia  R73.9 Hemoglobin A1c  5. High risk medication use  Z79.899 Vitamin B12  6. Gastroesophageal reflux disease, unspecified whether esophagitis present  K21.9   7. Generalized anxiety disorder  F41.1 PARoxetine (PAXIL) 20 MG tablet    Meds ordered this encounter  Medications  . omeprazole (PRILOSEC) 20 MG capsule    Sig: Take 1 capsule (20 mg total) by mouth daily.    Dispense:  90 capsule    Refill:  3    This  prescription was filled on 04/06/2019. Any refills authorized will be placed on file.  [name] PARoxetine (PAXIL) 20 MG tablet    Sig: Take 1 tablet (20 mg total) by mouth daily.    Dispense:  90 tablet    Refill:  1    Return precautions advised.  [name], MD

## 2019-07-27 NOTE — Patient Instructions (Signed)
Tammie [name] , Thank you for taking time to come for your Medicare Wellness Visit. I appreciate your ongoing commitment to your health goals. Please review the following plan we discussed and let me know if I can assist you in the future.   Screening recommendations/referrals: Colorectal Screening: up to date; last colonoscopy 09/29/14 Mammogram: no longer indicated Bone Density: last 09/27/15; recommended repeat   Vision and Dental Exams: Recommended annual ophthalmology exams for early detection of glaucoma and other disorders of the eye Recommended annual dental exams for proper oral hygiene  Vaccinations: Influenza vaccine:  recommended this fall either at PCP office or through your local pharmacy  Pneumococcal vaccine: up to date; last 10/19/15 Tdap vaccine: recommended; . Please call your insurance company to determine your out of pocket expense. You may receive this vaccine at your local pharmacy or Health Dept. Shingles vaccine: Please call your insurance company to determine your out of pocket expense for the Shingrix vaccine. You may receive this vaccine at your local pharmacy.  Advanced directives: Please bring a copy of your POA (Power of Attorney) and/or Living Will to your next appointment.  Goals: Recommend to drink at least 6-8 8oz glasses of water per day and consume a balanced diet rich in fresh fruits and vegetables.   Next appointment: Please schedule your Annual Wellness Visit with your Nurse Health Advisor in one year.  Preventive Care 50 Years and Older, Female Preventive care refers to lifestyle choices and visits with your health care provider that can promote health and wellness. What does preventive care include?  A yearly physical exam. This is also called an annual well check.  Dental exams once or twice a year.  Routine eye exams. Ask your health care provider how often you should have your eyes checked.  Personal lifestyle choices, including:  Daily care  of your teeth and gums.  Regular physical activity.  Eating a healthy diet.  Avoiding tobacco and drug use.  Limiting alcohol use.  Practicing safe sex.  Taking low-dose aspirin every day if recommended by your health care provider.  Taking vitamin and mineral supplements as recommended by your health care provider. What happens during an annual well check? The services and screenings done by your health care provider during your annual well check will depend on your age, overall health, lifestyle risk factors, and family history of disease. Counseling  Your health care provider may ask you questions about your:  Alcohol use.  Tobacco use.  Drug use.  Emotional well-being.  Home and relationship well-being.  Sexual activity.  Eating habits.  History of falls.  Memory and ability to understand (cognition).  Work and work Statistician.  Reproductive health. Screening  You may have the following tests or measurements:  Height, weight, and BMI.  Blood pressure.  Lipid and cholesterol levels. These may be checked every 5 years, or more frequently if you are over 62 years old.  Skin check.  Lung cancer screening. You may have this screening every year starting at age 65 if you have a 30-pack-year history of smoking and currently smoke or have quit within the past 15 years.  Fecal occult blood test (FOBT) of the stool. You may have this test every year starting at age 37.  Flexible sigmoidoscopy or colonoscopy. You may have a sigmoidoscopy every 5 years or a colonoscopy every 10 years starting at age 1.  Hepatitis C blood test.  Hepatitis B blood test.  Sexually transmitted disease (STD) testing.  Diabetes screening.  This is done by checking your blood sugar (glucose) after you have not eaten for a while (fasting). You may have this done every 1-3 years.  Bone density scan. This is done to screen for osteoporosis. You may have this done starting at age  49.  Mammogram. This may be done every 1-2 years. Talk to your health care provider about how often you should have regular mammograms. Talk with your health care provider about your test results, treatment options, and if necessary, the need for more tests. Vaccines  Your health care provider may recommend certain vaccines, such as:  Influenza vaccine. This is recommended every year.  Tetanus, diphtheria, and acellular pertussis (Tdap, Td) vaccine. You may need a Td booster every 10 years.  Zoster vaccine. You may need this after age 53.  Pneumococcal 13-valent conjugate (PCV13) vaccine. One dose is recommended after age 75.  Pneumococcal polysaccharide (PPSV23) vaccine. One dose is recommended after age 54. Talk to your health care provider about which screenings and vaccines you need and how often you need them. This information is not intended to replace advice given to you by your health care provider. Make sure you discuss any questions you have with your health care provider. Document Released: 09/29/2015 Document Revised: 05/22/2016 Document Reviewed: 07/04/2015 Elsevier Interactive Patient Education  2017 [place] Prevention in the Home Falls can cause injuries. They can happen to people of all ages. There are many things you can do to make your home safe and to help prevent falls. What can I do on the outside of my home?  Regularly fix the edges of walkways and driveways and fix any cracks.  Remove anything that might make you trip as you walk through a door, such as a raised step or threshold.  Trim any bushes or trees on the path to your home.  Use bright outdoor lighting.  Clear any walking paths of anything that might make someone trip, such as rocks or tools.  Regularly check to see if handrails are loose or broken. Make sure that both sides of any steps have handrails.  Any raised decks and porches should have guardrails on the edges.  Have any  leaves, snow, or ice cleared regularly.  Use sand or salt on walking paths during winter.  Clean up any spills in your garage right away. This includes oil or grease spills. What can I do in the bathroom?  Use night lights.  Install grab bars by the toilet and in the tub and shower. Do not use towel bars as grab bars.  Use non-skid mats or decals in the tub or shower.  If you need to sit down in the shower, use a plastic, non-slip stool.  Keep the floor dry. Clean up any water that spills on the floor as soon as it happens.  Remove soap buildup in the tub or shower regularly.  Attach bath mats securely with double-sided non-slip rug tape.  Do not have throw rugs and other things on the floor that can make you trip. What can I do in the bedroom?  Use night lights.  Make sure that you have a light by your bed that is easy to reach.  Do not use any sheets or blankets that are too big for your bed. They should not hang down onto the floor.  Have a firm chair that has side arms. You can use this for support while you get dressed.  Do not have throw rugs and  other things on the floor that can make you trip. What can I do in the kitchen?  Clean up any spills right away.  Avoid walking on wet floors.  Keep items that you use a lot in easy-to-reach places.  If you need to reach something above you, use a strong step stool that has a grab bar.  Keep electrical cords out of the way.  Do not use floor polish or wax that makes floors slippery. If you must use wax, use non-skid floor wax.  Do not have throw rugs and other things on the floor that can make you trip. What can I do with my stairs?  Do not leave any items on the stairs.  Make sure that there are handrails on both sides of the stairs and use them. Fix handrails that are broken or loose. Make sure that handrails are as long as the stairways.  Check any carpeting to make sure that it is firmly attached to the stairs.  Fix any carpet that is loose or worn.  Avoid having throw rugs at the top or bottom of the stairs. If you do have throw rugs, attach them to the floor with carpet tape.  Make sure that you have a light switch at the top of the stairs and the bottom of the stairs. If you do not have them, ask someone to add them for you. What else can I do to help prevent falls?  Wear shoes that:  Do not have high heels.  Have rubber bottoms.  Are comfortable and fit you well.  Are closed at the toe. Do not wear sandals.  If you use a stepladder:  Make sure that it is fully opened. Do not climb a closed stepladder.  Make sure that both sides of the stepladder are locked into place.  Ask someone to hold it for you, if possible.  Clearly mark and make sure that you can see:  Any grab bars or handrails.  First and last steps.  Where the edge of each step is.  Use tools that help you move around (mobility aids) if they are needed. These include:  Canes.  Walkers.  Scooters.  Crutches.  Turn on the lights when you go into a dark area. Replace any light bulbs as soon as they burn out.  Set up your furniture so you have a clear path. Avoid moving your furniture around.  If any of your floors are uneven, fix them.  If there are any pets around you, be aware of where they are.  Review your medicines with your doctor. Some medicines can make you feel dizzy. This can increase your chance of falling. Ask your doctor what other things that you can do to help prevent falls. This information is not intended to replace advice given to you by your health care provider. Make sure you discuss any questions you have with your health care provider. Document Released: 06/29/2009 Document Revised: 02/08/2016 Document Reviewed: 10/07/2014 Elsevier Interactive Patient Education  2017 [employer].

## 2019-07-27 NOTE — Progress Notes (Signed)
I have reviewed and agree with note, evaluation, plan.   Zane Samson, MD  

## 2019-07-27 NOTE — Telephone Encounter (Signed)
See note  Copied from [place] #[id number]. Topic: General - Call Back - No Documentation >> Jul 27, 2019  4:30 PM Erick Blinks wrote: Reason for CRM: Pt is requesting call back  Best contact: [phone]

## 2019-07-27 NOTE — Telephone Encounter (Signed)
Pharmacy called regarding the decrease in pts omeprazole. Please advise as the prescrtiption sent in was decreased by 20 mg than what she usually takes.    [facility] - [place], [place] - [address]  [address] [place] [address]  Phone: [phone] Fax: [phone]  Not a 24 hour pharmacy; exact hours not known.

## 2019-07-27 NOTE — Progress Notes (Signed)
Subjective:   Tammie [name] is a 84 y.o. female who presents for Medicare Annual (Subsequent) preventive examination.  Review of Systems:   Cardiac Risk Factors include: advanced age (>94men, >76 women);hypertension    Objective:     Vitals: BP (!) 146/78   Temp 98.3 F (36.8 C)   Ht 5\' 3"  (1.6 m)   Wt 171 lb 1.2 oz (77.6 kg)   BMI 30.30 kg/m   Body mass index is 30.3 kg/m.  Advanced Directives 07/27/2019 06/10/2018 11/21/2015 11/20/2015 08/04/2014  Does Patient Have a Medical Advance Directive? Yes Yes Yes Yes Yes  Type of Paramedic of [place];Living will [place];Living will - [place];Living will [place];Living will  Does patient want to make changes to medical advance directive? No - Patient declined No - Patient declined - - -  Copy of Rew in Chart? No - copy requested No - copy requested - No - copy requested -    Tobacco Social History   Tobacco Use  Smoking Status Never Smoker  Smokeless Tobacco Never Used     Counseling given: Not Answered   Clinical Intake:  Pre-visit preparation completed: Yes  Pain : No/denies pain  How often do you need to have someone help you when you read instructions, pamphlets, or other written materials from your doctor or pharmacy?: 1 - Never  Interpreter Needed?: No  Information entered by :: Denman George LPN  Past Medical History:  Diagnosis Date  . Abnormal uterine bleeding   . Allergic rhinitis   . Allergy    seasonal  . Anxiety    panic attacks  . Anxiety disorder   . Asthma   . Cataract    left eye surgery to removed  . Cataract    right eye  . Colorectal cancer ([facility]) 1997   T3, N0  . Depression    pt states she isn't depressed anymore, continues paxil daily  . Endometriosis   . Fibroid   . GERD (gastroesophageal reflux disease)   . Hemorrhoids   . Hyperlipidemia    diet controlled, no  meds  . Hypertension   . PONV (postoperative nausea and vomiting)   . Prediabetes   . Seasonal allergies   . SVD (spontaneous vaginal delivery)    x 3  . Thyroid disease   . Urinary incontinence    Past Surgical History:  Procedure Laterality Date  . cataract surgery Left   . COLECTOMY  1997   SIGMOID COLECTOMY 1997  . COLON SURGERY  1997  . COLONOSCOPY    . DILATATION & CURETTAGE/HYSTEROSCOPY WITH MYOSURE N/A 11/21/2015   Procedure: DILATATION & CURETTAGE/HYSTEROSCOPY WITH MYOSURE;  Surgeon: Salvadore Dom, MD;  Location: [facility];  Service: Gynecology;  Laterality: N/A;  . DILATION AND CURETTAGE OF UTERUS     D & C  . POLYPECTOMY    . TONSILLECTOMY     Family History  Problem Relation Age of Onset  . Sudden death Brother 48       Apparent DVT/PE. all males  . Heart attack Brother   . Varicose Veins Mother   . Hypertension Father   . Sudden death Cousin 64       Paternal cousin -dvt/pe 7 all males  . Coronary artery disease Cousin 66       Paternal cousin, MI  . Breast cancer Daughter 72  . Stroke Maternal Grandmother   .  Stroke Maternal Grandfather   . Early death Paternal Grandmother   . Tuberculosis Paternal Grandmother   . Early death Paternal Grandfather   . Tuberculosis Paternal Grandfather   . Hypertension Son   . Colon cancer Neg Hx   . Rectal cancer Neg Hx   . Stomach cancer Neg Hx    Social History   Socioeconomic History  . Marital status: Divorced    Spouse name: Not on file  . Number of children: Not on file  . Years of education: Not on file  . Highest education level: Not on file  Occupational History  . Not on file  Social Needs  . Financial resource strain: Not on file  . Food insecurity    Worry: Not on file    Inability: Not on file  . Transportation needs    Medical: Not on file    Non-medical: Not on file  Tobacco Use  . Smoking status: Never Smoker  . Smokeless tobacco: Never Used  Substance and Sexual Activity  . Alcohol  use: Yes    Alcohol/week: 0.0 standard drinks    Comment: occasional wine  . Drug use: No  . Sexual activity: Not Currently    Partners: Male    Birth control/protection: Post-menopausal  Lifestyle  . Physical activity    Days per week: Not on file    Minutes per session: Not on file  . Stress: Not on file  Relationships  . Social Herbalist on phone: Not on file    Gets together: Not on file    Attends religious service: Not on file    Active member of club or organization: Not on file    Attends meetings of clubs or organizations: Not on file    Relationship status: Not on file  Other Topics Concern  . Not on file  Social History Narrative   Divorced. Lives alone.  Two children. 2 granddaughters (80 and 10 years old in 2016)   Family of 7 including ex-husband      Shows houses (Artist- Radiographer, therapeutic and rental properties)- shows rentals      Hobbies: painting- enjoys classes, writing, photography, sewing    Outpatient Encounter Medications as of 07/27/2019  Medication Sig  . albuterol (VENTOLIN HFA) 108 (90 Base) MCG/ACT inhaler Inhale 2 puffs into the lungs every 6 hours as needed for wheezing or shortness of breath.  [name] amLODipine (NORVASC) 10 MG tablet Take 1 tablet (10 mg total) by mouth daily.  [name] augmented betamethasone dipropionate (DIPROLENE-AF) 0.05 % cream apply topically TO affected AREAS UP TO 2 TIMES DAILY AS NEEDED. AVOID FACE, GROIN, AND axilia  . budesonide-formoterol (SYMBICORT) 160-4.5 MCG/ACT inhaler INHALE 2 PUFFS INTO THE LUNGS 2 TIMES DAILY  . clobetasol (TEMOVATE) 0.05 % external solution Apply 1 application topically 2 (two) times daily. For 7-10 days up to twice a day maximum. Use only on scalp  . desloratadine (CLARINEX) 5 MG tablet TAKE 1 TABLET BY MOUTH EVERY DAY  . hydrocortisone (ANUSOL-HC) 2.5 % rectal cream Place 1 application rectally 2 times daily as needed for hemorrhoids.  [name] lisinopril-hydrochlorothiazide (ZESTORETIC)  20-25 MG tablet TAKE 1 TABLET BY MOUTH EVERY DAY  . meclizine (ANTIVERT) 25 MG tablet TAKE 1 TABLET BY MOUTH 2 TIMES DAILY AS NEEDED FOR vertigo OR dizziness.  [name] omeprazole (PRILOSEC) 20 MG capsule Take 1 capsule (20 mg total) by mouth daily.  [name] PARoxetine (PAXIL) 20 MG tablet Take 1 tablet (20 mg total)  by mouth daily.  . [DISCONTINUED] omeprazole (PRILOSEC) 40 MG capsule TAKE 1 CAPSULE BY MOUTH EVERY DAY  . [DISCONTINUED] PARoxetine (PAXIL) 20 MG tablet TAKE 1 TABLET BY MOUTH EVERY DAY   No facility-administered encounter medications on file as of 07/27/2019.     Activities of Daily Living In your present state of health, do you have any difficulty performing the following activities: 07/27/2019  Hearing? N  Vision? N  Difficulty concentrating or making decisions? N  Walking or climbing stairs? N  Dressing or bathing? N  Doing errands, shopping? N  Preparing Food and eating ? N  Using the Toilet? N  In the past six months, have you accidently leaked urine? N  Do you have problems with loss of bowel control? N  Managing your Medications? N  Managing your Finances? N  Housekeeping or managing your Housekeeping? N  Some recent data might be hidden    Patient Care Team: Marin Olp, MD as PCP - General (Family Medicine) Salvadore Dom, MD as Consulting Physician (Obstetrics and Gynecology) Landis Martins, DPM as Consulting Physician (Podiatry) Burden, Lincoln Brigham, MD as Consulting Physician (Ophthalmology) Druscilla Brownie, MD as Consulting Physician (Dermatology)    Assessment:   This is a routine wellness examination for Chandel.  Exercise Activities and Dietary recommendations Current Exercise Habits: The patient does not participate in regular exercise at present  Goals    . Weight (lb) < 200 lb (90.7 kg)     Wants to be more physically active and lose some weight       Fall Risk Fall Risk  07/27/2019 06/10/2018 07/15/2017 09/04/2015 08/09/2014  Falls in the  past year? 0 No No No No  Number falls in past yr: 0 - - - -  Injury with Fall? 0 - - - -   Is the patient's home free of loose throw rugs in walkways, pet beds, electrical cords, etc?   yes      Grab bars in the bathroom? yes      Handrails on the stairs?   yes      Adequate lighting?   yes  Timed Get Up and Go performed: completed and within normal timeframe; no gait abnormalities noted   Depression Screen PHQ 2/9 Scores 07/27/2019 01/25/2019 06/10/2018 07/15/2017  PHQ - 2 Score 0 0 0 1  PHQ- 9 Score - 3 - -     Cognitive Function- patient with concerns of being forgetful but attributes to stress and normal aging; patient education handout provided on forgetfulness (screening normal)     6CIT Screen 07/27/2019  What Year? 0 points  What month? 0 points  What time? 0 points  Count back from 20 0 points  Months in reverse 0 points  Repeat phrase 0 points  Total Score 0    Immunization History  Administered Date(s) Administered  . Pneumococcal Conjugate-13 08/09/2014  . Pneumococcal Polysaccharide-23 10/19/2015    Qualifies for Shingles Vaccine?Discussed and patient will check with pharmacy for coverage.  Patient education handout provided   Screening Tests Health Maintenance  Topic Date Due  . TETANUS/TDAP  08/25/2029 (Originally 09/08/1954)  . INFLUENZA VACCINE  08/07/2035 (Originally 04/17/2019)  . DEXA SCAN  Completed  . PNA vac Low Risk Adult  Completed    Cancer Screenings: Lung: Low Dose CT Chest recommended if Age 62-80 years, 30 pack-year currently smoking OR have quit w/in 15years. Patient does not qualify. Breast:  Up to date on Mammogram? Yes   Up to  date of Bone Density/Dexa? Yes Colorectal: Last colonoscopy 09/29/14 with Dr. Fuller Plan    Plan:  I have personally reviewed and addressed the Medicare Annual Wellness questionnaire and have noted the following in the patient's chart:  A. Medical and social history B. Use of alcohol, tobacco or illicit drugs  C.  Current medications and supplements D. Functional ability and status E.  Nutritional status F.  Physical activity G. Advance directives H. List of other physicians I.  Hospitalizations, surgeries, and ER visits in previous 12 months J.  [place] such as hearing and vision if needed, cognitive and depression L. Referrals, records requested, and appointments- none   In addition, I have reviewed and discussed with patient certain preventive protocols, quality metrics, and best practice recommendations. A written personalized care plan for preventive services as well as general preventive health recommendations were provided to patient.   Signed,  Denman George, LPN  Nurse Health Advisor   Nurse Notes: no additional

## 2019-07-27 NOTE — Patient Instructions (Addendum)
Team- please print out labs I ordered- show her icd 10 codes as well as orders- she is going to check with insurance to see if covered before coming in for labs- she will call back to schedule fasting labs after she checks with health team advantage.   Vitamin D from pharmacy 1000 to 2000 units per day  Please try to exercise minimum 10-15 minutes 5 days a week and we can build up at follow up  Try omeprazole 20mg  instead of 40mg  to see if that works for you- we can always go back up if we need to  [facility] you have a wonderful Holiday season and thrilled to hear about the room available for you at your daughter's house!

## 2019-07-27 NOTE — Assessment & Plan Note (Signed)
#  Long-term PPI-we will check B12.  Willing to try 20mg  prilosec- will send in.

## 2019-07-28 NOTE — Telephone Encounter (Signed)
Called and spoke with pt pharmacy and made them aware of dose decrease.

## 2019-07-28 NOTE — Telephone Encounter (Signed)
Need to call patient this was on her AVS from yesterday   Try omeprazole 20mg  instead of 40mg  to see if that works for you- we can always go back up if we need to

## 2019-08-09 ENCOUNTER — Other Ambulatory Visit: Payer: Self-pay

## 2019-08-11 ENCOUNTER — Other Ambulatory Visit: Payer: Self-pay

## 2019-08-16 ENCOUNTER — Other Ambulatory Visit: Payer: Self-pay | Admitting: Family Medicine

## 2019-08-16 DIAGNOSIS — F411 Generalized anxiety disorder: Secondary | ICD-10-CM

## 2019-08-24 ENCOUNTER — Other Ambulatory Visit: Payer: Self-pay

## 2019-09-27 ENCOUNTER — Other Ambulatory Visit: Payer: Self-pay | Admitting: Family Medicine

## 2019-09-29 ENCOUNTER — Other Ambulatory Visit: Payer: Self-pay

## 2019-10-13 ENCOUNTER — Other Ambulatory Visit: Payer: Self-pay

## 2019-10-25 ENCOUNTER — Other Ambulatory Visit: Payer: Self-pay

## 2019-10-26 ENCOUNTER — Other Ambulatory Visit (INDEPENDENT_AMBULATORY_CARE_PROVIDER_SITE_OTHER): Payer: PPO

## 2019-10-26 DIAGNOSIS — Z79899 Other long term (current) drug therapy: Secondary | ICD-10-CM | POA: Diagnosis not present

## 2019-10-26 DIAGNOSIS — R739 Hyperglycemia, unspecified: Secondary | ICD-10-CM | POA: Diagnosis not present

## 2019-10-26 DIAGNOSIS — E785 Hyperlipidemia, unspecified: Secondary | ICD-10-CM | POA: Diagnosis not present

## 2019-10-26 LAB — CBC WITH DIFFERENTIAL/PLATELET
Basophils Absolute: 0.1 10*3/uL (ref 0.0–0.1)
Basophils Relative: 0.8 % (ref 0.0–3.0)
Eosinophils Absolute: 0.3 10*3/uL (ref 0.0–0.7)
Eosinophils Relative: 4.1 % (ref 0.0–5.0)
HCT: 42.6 % (ref 36.0–46.0)
Hemoglobin: 15.1 g/dL — ABNORMAL HIGH (ref 12.0–15.0)
Lymphocytes Relative: 19.8 % (ref 12.0–46.0)
Lymphs Abs: 1.7 10*3/uL (ref 0.7–4.0)
MCHC: 35.4 g/dL (ref 30.0–36.0)
MCV: 89.9 fl (ref 78.0–100.0)
Monocytes Absolute: 0.9 10*3/uL (ref 0.1–1.0)
Monocytes Relative: 11 % (ref 3.0–12.0)
Neutro Abs: 5.4 10*3/uL (ref 1.4–7.7)
Neutrophils Relative %: 64.3 % (ref 43.0–77.0)
Platelets: 278 10*3/uL (ref 150.0–400.0)
RBC: 4.74 Mil/uL (ref 3.87–5.11)
RDW: 13.4 % (ref 11.5–15.5)
WBC: 8.4 10*3/uL (ref 4.0–10.5)

## 2019-10-26 LAB — COMPREHENSIVE METABOLIC PANEL
ALT: 17 U/L (ref 0–35)
AST: 21 U/L (ref 0–37)
Albumin: 4.4 g/dL (ref 3.5–5.2)
Alkaline Phosphatase: 98 U/L (ref 39–117)
BUN: 17 mg/dL (ref 6–23)
CO2: 34 mEq/L — ABNORMAL HIGH (ref 19–32)
Calcium: 10.6 mg/dL — ABNORMAL HIGH (ref 8.4–10.5)
Chloride: 100 mEq/L (ref 96–112)
Creatinine, Ser: 0.77 mg/dL (ref 0.40–1.20)
GFR: 71.74 mL/min (ref >60.00)
Glucose, Bld: 138 mg/dL — ABNORMAL HIGH (ref 70–99)
Potassium: 3.8 mEq/L (ref 3.5–5.1)
Sodium: 140 mEq/L (ref 135–145)
Total Bilirubin: 0.7 mg/dL (ref 0.2–1.2)
Total Protein: 7.4 g/dL (ref 6.0–8.3)

## 2019-10-26 LAB — LDL CHOLESTEROL, DIRECT: Direct LDL: 115 mg/dL

## 2019-10-26 LAB — LIPID PANEL
Cholesterol: 177 mg/dL (ref 0–200)
HDL: 31.2 mg/dL — ABNORMAL LOW (ref >39.00)
NonHDL: 145.58
Total CHOL/HDL Ratio: 6
Triglycerides: 229 mg/dL — ABNORMAL HIGH (ref 0.0–149.0)
VLDL: 45.8 mg/dL — ABNORMAL HIGH (ref 0.0–40.0)

## 2019-10-26 LAB — HEMOGLOBIN A1C: Hgb A1c MFr Bld: 6.3 % (ref 4.6–6.5)

## 2019-10-26 LAB — VITAMIN B12: Vitamin B-12: 701 pg/mL (ref 211–911)

## 2019-12-02 ENCOUNTER — Other Ambulatory Visit: Payer: Self-pay | Admitting: Family Medicine

## 2019-12-02 DIAGNOSIS — J4541 Moderate persistent asthma with (acute) exacerbation: Secondary | ICD-10-CM

## 2020-02-10 ENCOUNTER — Other Ambulatory Visit: Payer: Self-pay | Admitting: Family Medicine

## 2020-02-11 ENCOUNTER — Other Ambulatory Visit: Payer: Self-pay | Admitting: Family Medicine

## 2020-04-13 ENCOUNTER — Telehealth: Payer: Self-pay

## 2020-04-13 NOTE — Telephone Encounter (Signed)
Pt would like one of Dr. Ansel Bong nurses to call her back

## 2020-04-13 NOTE — Telephone Encounter (Signed)
Called patient wanted to make sure she has not missed any calls from our office.

## 2020-04-21 ENCOUNTER — Telehealth: Payer: Self-pay | Admitting: Family Medicine

## 2020-04-21 NOTE — Progress Notes (Signed)
  Chronic Care Management   Outreach Note  04/21/2020 Name: Tammie [name] MRN: [id number] DOB: 21-Nov-1934  Referred by: Marin Olp, MD Reason for referral : No chief complaint on file.   An unsuccessful telephone outreach was attempted today. The patient was referred to the pharmacist for assistance with care management and care coordination.   Follow Up Plan:   Earney [place] Upstream Scheduler

## 2020-04-24 ENCOUNTER — Other Ambulatory Visit: Payer: Self-pay | Admitting: Family Medicine

## 2020-05-05 ENCOUNTER — Telehealth: Payer: Self-pay | Admitting: Family Medicine

## 2020-05-05 NOTE — Progress Notes (Signed)
  Chronic Care Management   Outreach Note  05/05/2020 Name: Tammie [name] MRN: [id number] DOB: 1935-02-22  Referred by: Marin Olp, MD Reason for referral : No chief complaint on file.   An unsuccessful telephone outreach was attempted today. The patient was referred to the pharmacist for assistance with care management and care coordination.   Follow Up Plan:   Earney Hamburg Upstream Scheduler

## 2020-05-08 ENCOUNTER — Telehealth: Payer: Self-pay | Admitting: Family Medicine

## 2020-05-08 NOTE — Progress Notes (Signed)
  Chronic Care Management   Outreach Note  05/08/2020 Name: Tammie [name] MRN: [id number] DOB: Apr 26, 1935  Referred by: Marin Olp, MD Reason for referral : No chief complaint on file.   An unsuccessful telephone outreach was attempted today. The patient was referred to the pharmacist for assistance with care management and care coordination.   Follow Up Plan:   Earney Hamburg Upstream Scheduler

## 2020-05-09 ENCOUNTER — Telehealth: Payer: Self-pay | Admitting: Family Medicine

## 2020-05-09 NOTE — Progress Notes (Signed)
  Chronic Care Management   Outreach Note  05/09/2020 Name: Tammie [name] MRN: [id number] DOB: 1935-01-18  Referred by: Marin Olp, MD Reason for referral : No chief complaint on file.   An unsuccessful telephone outreach was attempted today. The patient was referred to the pharmacist for assistance with care management and care coordination.   Follow Up Plan:   Earney Hamburg Upstream Scheduler

## 2020-05-10 ENCOUNTER — Telehealth: Payer: Self-pay | Admitting: Family Medicine

## 2020-05-10 NOTE — Progress Notes (Signed)
°  Chronic Care Management   Outreach Note  05/10/2020 Name: Tammie [name] MRN: [id number] DOB: Oct 08, 1934  Referred by: Marin Olp, MD Reason for referral : No chief complaint on file.   An unsuccessful telephone outreach was attempted today. The patient was referred to the pharmacist for assistance with care management and care coordination.   Follow Up Plan:   Earney Hamburg Upstream Scheduler

## 2020-06-28 DIAGNOSIS — L218 Other seborrheic dermatitis: Secondary | ICD-10-CM | POA: Diagnosis not present

## 2020-06-28 DIAGNOSIS — L820 Inflamed seborrheic keratosis: Secondary | ICD-10-CM | POA: Diagnosis not present

## 2020-08-03 ENCOUNTER — Other Ambulatory Visit: Payer: Self-pay | Admitting: Family Medicine

## 2020-08-03 DIAGNOSIS — F411 Generalized anxiety disorder: Secondary | ICD-10-CM

## 2020-08-14 NOTE — Progress Notes (Signed)
Phone [phone] Virtual visit via phonenote   Subjective:   Chief Complaint  Patient presents with  . Cough  . Nasal Congestion  . Positive Covid test   This visit type was conducted due to national recommendations for restrictions regarding the COVID-19 Pandemic (e.g. social distancing).  This format is felt to be most appropriate for this patient at this time balancing risks to patient and risks to population by having him in for in person visit.  All issues noted in this document were discussed and addressed.  No physical exam was performed (except for noted visual exam or audio findings with Telehealth visits).  The patient has consented to conduct a Telehealth visit and understands insurance will be billed.   Our team/I connected with Sallyanne Kuster at  3:40 PM EST by phone (patient did not have equipment for webex) and verified that I am speaking with the correct person using two identifiers.  Location patient: [facility] Location provider: [facility], office Persons participating in the virtual visit:  patient  Time on phone: 15 minutes  Our team/I discussed the limitations of evaluation and management by telemedicine and the availability of in person appointments. In light of current covid-19 pandemic, patient also understands that we are trying to protect them by minimizing in office contact if at all possible.  The patient expressed consent for telemedicine visit and agreed to proceed. Patient understands insurance will be billed.   Past Medical History-  Patient Active Problem List   Diagnosis Date Noted  . Prediabetes     Priority: High  . Hyperglycemia 07/15/2017    Priority: High  . Osteopenia 10/02/2015    Priority: High  . Hyperlipidemia 01/15/2018    Priority: Medium  . Vertigo 07/15/2017    Priority: Medium  . Hypertension     Priority: Medium  . Panic attacks     Priority: Medium  . History of colorectal cancer     Priority: Medium  . Asthma      Priority: Medium  . Left leg pain 12/20/2016    Priority: Low  . Hemorrhoids 12/20/2016    Priority: Low  . Dermatitis 12/20/2016    Priority: Low  . Systolic murmur 19/14/7829    Priority: Low  . Allergic rhinitis     Priority: Low  . GERD 06/22/2009    Priority: Low    Medications- reviewed and updated Current Outpatient Medications  Medication Sig Dispense Refill  . albuterol (VENTOLIN HFA) 108 (90 Base) MCG/ACT inhaler Inhale 2 puffs into the lungs every 6 hours as needed for wheezing or shortness of breath. 8.5 g 2  . amLODipine (NORVASC) 10 MG tablet TAKE 1 TABLET BY MOUTH EVERY DAY 90 tablet 3  . augmented betamethasone dipropionate (DIPROLENE-AF) 0.05 % cream apply topically TO affected AREAS UP TO 2 TIMES DAILY AS NEEDED. AVOID FACE, GROIN, AND axilia    . budesonide-formoterol (SYMBICORT) 160-4.5 MCG/ACT inhaler INHALE 2 PUFFS INTO THE LUNGS 2 TIMES DAILY 10.2 g 5  . clobetasol (TEMOVATE) 0.05 % external solution Apply 1 application topically 2 (two) times daily. For 7-10 days up to twice a day maximum. Use only on scalp 50 mL 0  . desloratadine (CLARINEX) 5 MG tablet TAKE 1 TABLET BY MOUTH EVERY DAY 30 tablet 5  . hydrocortisone (ANUSOL-HC) 2.5 % rectal cream Place 1 application rectally 2 times daily as needed for hemorrhoids. 28.35 g 1  . lisinopril-hydrochlorothiazide (ZESTORETIC) 20-25 MG tablet TAKE 1 TABLET BY MOUTH EVERY DAY 90  tablet 1  . meclizine (ANTIVERT) 25 MG tablet TAKE 1 TABLET BY MOUTH 2 TIMES DAILY AS NEEDED FOR vertigo OR dizziness. 30 tablet 0  . omeprazole (PRILOSEC) 20 MG capsule Take 1 capsule (20 mg total) by mouth daily. Please schedule a physical with Dr. Yong Channel for further refills [phone] 90 capsule 0  . PARoxetine (PAXIL) 20 MG tablet Take 1 tablet (20 mg total) by mouth daily. 90 tablet 1   No current facility-administered medications for this visit.     Objective:  no self reported vitals - denies fever Nonlabored voice, normal speech        Assessment and Plan    # Cough, Congestion and Positive Covid Test  S:Patient mentioned that she has been having cough and congestion Tuesday night( 08-08-2020). She stated that she was exposed to covid on Sunday( 08-06-2020). She has taken otc Coricidin. Patient stated that she tested positive for covid yesterday on binax home test (08-14-2020)- initial test negative 3 days into it. She denies having a fever, body aches and chills. Main symptoms are coughing and fatigue. Coughing has improved over the last couple days. Unvaccinated from covid 19- She has no plans of getting the covid vaccine or flu shot.  Very low in energy and cough are main symptoms. Cough and cold for high blood pressure helping some.  A/P: Patient with testing confirming covid 19 with first day of covid 19 symptoms  08/08/20 Therefore: - recommended patient watch closely for shortness of breath or confusion or worsening symptoms and if those occur he should contact us immediately or seek care in the emergency department -recommended patient consider purchasing pulse oximeter and if levels 94% or below persistently- seek care at the hospital - for quarantine if covid 19 needs to be at least 10 days since first symptom AND at least 24 hours fever free without fever reducing medications AND have improvement in respiratory symptoms  - earliest possible day out of quarantine December 3rd, recommendations for patient - we also discussed unvaccinated close contacts would need 14 day quarantine after last close contact with patient  (denies contact) If High risk for complications-we discussed potential for antibody treatment- she agrees -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  -Patient stopped Symbicort a few days ago-discussed budesonide potential benefit with COVID-19-recommended restarting.  Fortunately no wheezing or significant shortness of breath-no obvious asthma  exacerbation.  Recommended follow up: As needed if symptoms fail to improve  Lab/Order associations:   ICD-10-CM   1. COVID-19  U07.1     Return precautions advised.  [name], MD

## 2020-08-15 ENCOUNTER — Telehealth (HOSPITAL_COMMUNITY): Payer: Self-pay | Admitting: Nurse Practitioner

## 2020-08-15 ENCOUNTER — Encounter: Payer: Self-pay | Admitting: Family Medicine

## 2020-08-15 ENCOUNTER — Other Ambulatory Visit (HOSPITAL_COMMUNITY): Payer: Self-pay | Admitting: Family

## 2020-08-15 ENCOUNTER — Telehealth (INDEPENDENT_AMBULATORY_CARE_PROVIDER_SITE_OTHER): Payer: PPO | Admitting: Family Medicine

## 2020-08-15 ENCOUNTER — Other Ambulatory Visit: Payer: Self-pay

## 2020-08-15 DIAGNOSIS — U071 COVID-19: Secondary | ICD-10-CM

## 2020-08-15 NOTE — Patient Instructions (Addendum)
Health Maintenance Due  Topic Date Due  . COVID-19 Vaccine (1) Has not received her vaccine. Never done    Depression screen [facility] 2/9 08/15/2020 07/27/2019 01/25/2019  Decreased Interest 0 0 0  Down, Depressed, Hopeless 0 0 0  PHQ - 2 Score 0 0 0  Altered sleeping - - 0  Tired, decreased energy - - 3  Change in appetite - - 0  Feeling bad or failure about yourself  - - 0  Trouble concentrating - - 0  Moving slowly or fidgety/restless - - 0  Suicidal thoughts - - 0  PHQ-9 Score - - 3    Recommended follow up: No follow-ups on file.

## 2020-08-15 NOTE — Progress Notes (Signed)
I connected by phone with Tammie [name] on 08/15/2020 at 6:50 PM to discuss the potential use of a new treatment for mild to moderate COVID-19 viral infection in non-hospitalized patients.  This patient is a 85 y.o. female that meets the FDA criteria for Emergency Use Authorization of COVID monoclonal antibody casirivimab/imdevimab, bamlanivimab/eteseviamb, or sotrovimab.  Has a (+) direct SARS-CoV-2 viral test result  Has mild or moderate COVID-19   Is NOT hospitalized due to COVID-19  Is within 10 days of symptom onset  Has at least one of the high risk factor(s) for progression to severe COVID-19 and/or hospitalization as defined in EUA.  Specific high risk criteria : Older age (>/= 83 yo), Cardiovascular disease or hypertension and Chronic Lung Disease   Symptoms of cough began 08/09/20.   I have spoken and communicated the following to the patient or parent/caregiver regarding COVID monoclonal antibody treatment:  1. FDA has authorized the emergency use for the treatment of mild to moderate COVID-19 in adults and pediatric patients with positive results of direct SARS-CoV-2 viral testing who are 48 years of age and older weighing at least 40 kg, and who are at high risk for progressing to severe COVID-19 and/or hospitalization.  2. The significant known and potential risks and benefits of COVID monoclonal antibody, and the extent to which such potential risks and benefits are unknown.  3. Information on available alternative treatments and the risks and benefits of those alternatives, including clinical trials.  4. Patients treated with COVID monoclonal antibody should continue to self-isolate and use infection control measures (e.g., wear mask, isolate, social distance, avoid sharing personal items, clean and disinfect "high touch" surfaces, and frequent handwashing) according to CDC guidelines.   5. The patient or parent/caregiver has the option to accept or refuse COVID  monoclonal antibody treatment.  After reviewing this information with the patient, the patient has agreed to receive one of the available covid 19 monoclonal antibodies and will be provided an appropriate fact sheet prior to infusion. Asencion Gowda, NP 08/15/2020 6:50 PM

## 2020-08-15 NOTE — Telephone Encounter (Signed)
Called to discuss with patient about Covid symptoms and the use of the monoclonal antibody infusion for those with mild to moderate Covid symptoms and at a high risk of hospitalization.     Pt appears to qualify for this infusion due to co-morbid conditions and/or a member of an at-risk group in accordance with the FDA Emergency Use Authorization.    Risk factors include: Age, Asthma, Hypertension   Symptom onset: 11/24: cough   Tested positive for COVID 19: + 08/14/20 (antigen home test)   Discussed information regarding costs of monoclonal antibody treatment, given both CPT & REV codes, and encouraged patient to call their health insurance company to verify cost of treatment that pt will be financially responsible for.  Patient aware they will receive a call from APP for further information and to schedule appointment. All questions answered.

## 2020-08-16 ENCOUNTER — Ambulatory Visit (HOSPITAL_COMMUNITY)
Admission: RE | Admit: 2020-08-16 | Discharge: 2020-08-16 | Disposition: A | Discharge status: Home / Self Care | Payer: PPO | Source: Ambulatory Visit | Attending: Pulmonary Disease | Admitting: Pulmonary Disease

## 2020-08-16 DIAGNOSIS — U071 COVID-19: Secondary | ICD-10-CM | POA: Diagnosis present

## 2020-08-16 DIAGNOSIS — Z23 Encounter for immunization: Secondary | ICD-10-CM | POA: Insufficient documentation

## 2020-08-16 MED ORDER — FAMOTIDINE IN NACL 20-0.9 MG/50ML-% IV SOLN: 20.0000 mg | Freq: Once | INTRAVENOUS | Status: DC | PRN

## 2020-08-16 MED ORDER — SODIUM CHLORIDE 0.9 % IV SOLN: INTRAVENOUS | Status: DC | PRN

## 2020-08-16 MED ORDER — SOTROVIMAB 500 MG/8ML IV SOLN
500.0000 mg | Freq: Once | INTRAVENOUS | Status: AC
  Administered 2020-08-16: 500 mg via INTRAVENOUS

## 2020-08-16 MED ORDER — ALBUTEROL SULFATE HFA 108 (90 BASE) MCG/ACT IN AERS: 2.0000 | INHALATION_SPRAY | Freq: Once | RESPIRATORY_TRACT | Status: DC | PRN

## 2020-08-16 MED ORDER — METHYLPREDNISOLONE SODIUM SUCC 125 MG IJ SOLR: 125.0000 mg | Freq: Once | INTRAMUSCULAR | Status: DC | PRN

## 2020-08-16 MED ORDER — EPINEPHRINE 0.3 MG/0.3ML IJ SOAJ: 0.3000 mg | Freq: Once | INTRAMUSCULAR | Status: DC | PRN

## 2020-08-16 MED ORDER — DIPHENHYDRAMINE HCL 50 MG/ML IJ SOLN: 50.0000 mg | Freq: Once | INTRAMUSCULAR | Status: DC | PRN

## 2020-08-16 NOTE — Discharge Instructions (Signed)
10 Things You Can Do to Manage Your COVID-19 Symptoms at Home If you have possible or confirmed COVID-19: 1. Stay home from work and school. And stay away from other public places. If you must go out, avoid using any kind of public transportation, ridesharing, or taxis. 2. Monitor your symptoms carefully. If your symptoms get worse, call your healthcare provider immediately. 3. Get rest and stay hydrated. 4. If you have a medical appointment, call the healthcare provider ahead of time and tell them that you have or may have COVID-19. 5. For medical emergencies, call 911 and notify the dispatch personnel that you have or may have COVID-19. 6. Cover your cough and sneezes with a tissue or use the inside of your elbow. 7. Wash your hands often with soap and water for at least 20 seconds or clean your hands with an alcohol-based hand sanitizer that contains at least 60% alcohol. 8. As much as possible, stay in a specific room and away from other people in your home. Also, you should use a separate bathroom, if available. If you need to be around other people in or outside of the home, wear a mask. 9. Avoid sharing personal items with other people in your household, like dishes, towels, and bedding. 10. Clean all surfaces that are touched often, like counters, tabletops, and doorknobs. Use household cleaning sprays or wipes according to the label instructions. cdc.gov/coronavirus 03/17/2019 This information is not intended to replace advice given to you by your health care provider. Make sure you discuss any questions you have with your health care provider. Document Revised: 08/19/2019 Document Reviewed: 08/19/2019 Elsevier Patient Education  2020 Elsevier Inc. What types of side effects do monoclonal antibody drugs cause?  Common side effects  In general, the more common side effects caused by monoclonal antibody drugs include: . Allergic reactions, such as hives or itching . Flu-like signs and  symptoms, including chills, fatigue, fever, and muscle aches and pains . Nausea, vomiting . Diarrhea . Skin rashes . Low blood pressure   The CDC is recommending patients who receive monoclonal antibody treatments wait at least 90 days before being vaccinated.  Currently, there are no data on the safety and efficacy of mRNA COVID-19 vaccines in persons who received monoclonal antibodies or convalescent plasma as part of COVID-19 treatment. Based on the estimated half-life of such therapies as well as evidence suggesting that reinfection is uncommon in the 90 days after initial infection, vaccination should be deferred for at least 90 days, as a precautionary measure until additional information becomes available, to avoid interference of the antibody treatment with vaccine-induced immune responses. If you have any questions or concerns after the infusion please call the Advanced Practice Provider on call at [phone]. This number is ONLY intended for your use regarding questions or concerns about the infusion post-treatment side-effects.  Please do not provide this number to others for use. For return to work notes please contact your primary care provider.   If someone you know is interested in receiving treatment please have them call the COVID hotline at [phone].   

## 2020-08-16 NOTE — Progress Notes (Signed)
Patient reviewed Fact Sheet for Patients, Parents, and Caregivers for Emergency Use Authorization (EUA) of Sotrovimab for the Treatment of Coronavirus. Patient also reviewed and is agreeable to the estimated cost of treatment. Patient is agreeable to proceed.   

## 2020-08-16 NOTE — Progress Notes (Signed)
Diagnosis: COVID-19  Physician: Dr. Patrick Wright  Procedure: Covid Infusion Clinic Med: Sotrovimab infusion - Provided patient with sotrovimab fact sheet for patients, parents, and caregivers prior to infusion.   Complications: No immediate complications noted  Discharge: Discharged home    

## 2020-10-02 ENCOUNTER — Ambulatory Visit: Payer: PPO

## 2020-10-23 ENCOUNTER — Other Ambulatory Visit: Payer: Self-pay | Admitting: Family Medicine

## 2020-10-30 ENCOUNTER — Other Ambulatory Visit: Payer: Self-pay | Admitting: Family Medicine

## 2020-10-30 DIAGNOSIS — F411 Generalized anxiety disorder: Secondary | ICD-10-CM

## 2020-11-02 ENCOUNTER — Ambulatory Visit (INDEPENDENT_AMBULATORY_CARE_PROVIDER_SITE_OTHER): Payer: PPO

## 2020-11-02 ENCOUNTER — Other Ambulatory Visit: Payer: Self-pay

## 2020-11-02 VITALS — BP 136/65 | HR 76 | Temp 98.3°F | Resp 20 | Wt 171.8 lb

## 2020-11-02 DIAGNOSIS — Z0000 Encounter for general adult medical examination without abnormal findings: Secondary | ICD-10-CM

## 2020-11-02 NOTE — Patient Instructions (Addendum)
Tammie [name] , Thank you for taking time to come for your Medicare Wellness Visit. I appreciate your ongoing commitment to your health goals. Please review the following plan we discussed and let me know if I can assist you in the future.   Screening recommendations/referrals: Colonoscopy: Done 09/19/14 Mammogram: Done 09/21/12 Bone Density: Done 09/27/15 Recommended yearly ophthalmology/optometry visit for glaucoma screening and checkup Recommended yearly dental visit for hygiene and checkup  Vaccinations: Influenza vaccine: Due and discussed Pneumococcal vaccine: Up to date Tdap vaccine: due and discussed Shingles vaccine: Shingrix discussed. Please contact your pharmacy for coverage information.    Covid-19:Declined and discussed   Advanced directives: Please bring a copy of your health care power of attorney and living will to the office at your convenience.  Conditions/risks identified: Lose weight and get back to exercise   Next appointment: Follow up in one year for your annual wellness visit    Preventive Care 65 Years and Older, Female Preventive care refers to lifestyle choices and visits with your health care provider that can promote health and wellness. What does preventive care include?  A yearly physical exam. This is also called an annual well check.  Dental exams once or twice a year.  Routine eye exams. Ask your health care provider how often you should have your eyes checked.  Personal lifestyle choices, including:  Daily care of your teeth and gums.  Regular physical activity.  Eating a healthy diet.  Avoiding tobacco and drug use.  Limiting alcohol use.  Practicing safe sex.  Taking low-dose aspirin every day.  Taking vitamin and mineral supplements as recommended by your health care provider. What happens during an annual well check? The services and screenings done by your health care provider during your annual well check will depend on your age,  overall health, lifestyle risk factors, and family history of disease. Counseling  Your health care provider may ask you questions about your:  Alcohol use.  Tobacco use.  Drug use.  Emotional well-being.  Home and relationship well-being.  Sexual activity.  Eating habits.  History of falls.  Memory and ability to understand (cognition).  Work and work Statistician.  Reproductive health. Screening  You may have the following tests or measurements:  Height, weight, and BMI.  Blood pressure.  Lipid and cholesterol levels. These may be checked every 5 years, or more frequently if you are over 52 years old.  Skin check.  Lung cancer screening. You may have this screening every year starting at age 44 if you have a 30-pack-year history of smoking and currently smoke or have quit within the past 15 years.  Fecal occult blood test (FOBT) of the stool. You may have this test every year starting at age 30.  Flexible sigmoidoscopy or colonoscopy. You may have a sigmoidoscopy every 5 years or a colonoscopy every 10 years starting at age 51.  Hepatitis C blood test.  Hepatitis B blood test.  Sexually transmitted disease (STD) testing.  Diabetes screening. This is done by checking your blood sugar (glucose) after you have not eaten for a while (fasting). You may have this done every 1-3 years.  Bone density scan. This is done to screen for osteoporosis. You may have this done starting at age 9.  Mammogram. This may be done every 1-2 years. Talk to your health care provider about how often you should have regular mammograms. Talk with your health care provider about your test results, treatment options, and if necessary, the need  for more tests. Vaccines  Your health care provider may recommend certain vaccines, such as:  Influenza vaccine. This is recommended every year.  Tetanus, diphtheria, and acellular pertussis (Tdap, Td) vaccine. You may need a Td booster every 10  years.  Zoster vaccine. You may need this after age 31.  Pneumococcal 13-valent conjugate (PCV13) vaccine. One dose is recommended after age 84.  Pneumococcal polysaccharide (PPSV23) vaccine. One dose is recommended after age 53. Talk to your health care provider about which screenings and vaccines you need and how often you need them. This information is not intended to replace advice given to you by your health care provider. Make sure you discuss any questions you have with your health care provider. Document Released: 09/29/2015 Document Revised: 05/22/2016 Document Reviewed: 07/04/2015 Elsevier Interactive Patient Education  2017 [place] Prevention in the Home Falls can cause injuries. They can happen to people of all ages. There are many things you can do to make your home safe and to help prevent falls. What can I do on the outside of my home?  Regularly fix the edges of walkways and driveways and fix any cracks.  Remove anything that might make you trip as you walk through a door, such as a raised step or threshold.  Trim any bushes or trees on the path to your home.  Use bright outdoor lighting.  Clear any walking paths of anything that might make someone trip, such as rocks or tools.  Regularly check to see if handrails are loose or broken. Make sure that both sides of any steps have handrails.  Any raised decks and porches should have guardrails on the edges.  Have any leaves, snow, or ice cleared regularly.  Use sand or salt on walking paths during winter.  Clean up any spills in your garage right away. This includes oil or grease spills. What can I do in the bathroom?  Use night lights.  Install grab bars by the toilet and in the tub and shower. Do not use towel bars as grab bars.  Use non-skid mats or decals in the tub or shower.  If you need to sit down in the shower, use a plastic, non-slip stool.  Keep the floor dry. Clean up any water that  spills on the floor as soon as it happens.  Remove soap buildup in the tub or shower regularly.  Attach bath mats securely with double-sided non-slip rug tape.  Do not have throw rugs and other things on the floor that can make you trip. What can I do in the bedroom?  Use night lights.  Make sure that you have a light by your bed that is easy to reach.  Do not use any sheets or blankets that are too big for your bed. They should not hang down onto the floor.  Have a firm chair that has side arms. You can use this for support while you get dressed.  Do not have throw rugs and other things on the floor that can make you trip. What can I do in the kitchen?  Clean up any spills right away.  Avoid walking on wet floors.  Keep items that you use a lot in easy-to-reach places.  If you need to reach something above you, use a strong step stool that has a grab bar.  Keep electrical cords out of the way.  Do not use floor polish or wax that makes floors slippery. If you must use wax, use  non-skid floor wax.  Do not have throw rugs and other things on the floor that can make you trip. What can I do with my stairs?  Do not leave any items on the stairs.  Make sure that there are handrails on both sides of the stairs and use them. Fix handrails that are broken or loose. Make sure that handrails are as long as the stairways.  Check any carpeting to make sure that it is firmly attached to the stairs. Fix any carpet that is loose or worn.  Avoid having throw rugs at the top or bottom of the stairs. If you do have throw rugs, attach them to the floor with carpet tape.  Make sure that you have a light switch at the top of the stairs and the bottom of the stairs. If you do not have them, ask someone to add them for you. What else can I do to help prevent falls?  Wear shoes that:  Do not have high heels.  Have rubber bottoms.  Are comfortable and fit you well.  Are closed at the  toe. Do not wear sandals.  If you use a stepladder:  Make sure that it is fully opened. Do not climb a closed stepladder.  Make sure that both sides of the stepladder are locked into place.  Ask someone to hold it for you, if possible.  Clearly mark and make sure that you can see:  Any grab bars or handrails.  First and last steps.  Where the edge of each step is.  Use tools that help you move around (mobility aids) if they are needed. These include:  Canes.  Walkers.  Scooters.  Crutches.  Turn on the lights when you go into a dark area. Replace any light bulbs as soon as they burn out.  Set up your furniture so you have a clear path. Avoid moving your furniture around.  If any of your floors are uneven, fix them.  If there are any pets around you, be aware of where they are.  Review your medicines with your doctor. Some medicines can make you feel dizzy. This can increase your chance of falling. Ask your doctor what other things that you can do to help prevent falls. This information is not intended to replace advice given to you by your health care provider. Make sure you discuss any questions you have with your health care provider. Document Released: 06/29/2009 Document Revised: 02/08/2016 Document Reviewed: 10/07/2014 Elsevier Interactive Patient Education  2017 [employer].

## 2020-11-02 NOTE — Progress Notes (Signed)
Subjective:   Tammie [name] is a 86 y.o. female who presents for Medicare Annual (Subsequent) preventive examination.  Review of Systems     Cardiac Risk Factors include: advanced age (>58men, >59 women);dyslipidemia;hypertension;obesity (BMI >30kg/m2)     Objective:    Today's Vitals   11/02/20 1349  BP: 136/65  Pulse: 76  Resp: 20  Temp: 98.3 F (36.8 C)  SpO2: 94%  Weight: 171 lb 12.8 oz (77.9 kg)   Body mass index is 30.43 kg/m.  Advanced Directives 11/02/2020 07/27/2019 06/10/2018 11/21/2015 11/20/2015 08/04/2014  Does Patient Have a Medical Advance Directive? Yes Yes Yes Yes Yes Yes  Type of Advance Directive Living will [place];Living will [place];Living will - [place];Living will [place];Living will  Does patient want to make changes to medical advance directive? - No - Patient declined No - Patient declined - - -  Copy of [place] in Chart? - No - copy requested No - copy requested - No - copy requested -    Current Medications (verified) Outpatient Encounter Medications as of 11/02/2020  Medication Sig  . albuterol (VENTOLIN HFA) 108 (90 Base) MCG/ACT inhaler Inhale 2 puffs into the lungs every 6 hours as needed for wheezing or shortness of breath.  [name] amLODipine (NORVASC) 10 MG tablet TAKE 1 TABLET BY MOUTH EVERY DAY  . augmented betamethasone dipropionate (DIPROLENE-AF) 0.05 % cream apply topically TO affected AREAS UP TO 2 TIMES DAILY AS NEEDED. AVOID FACE, GROIN, AND axilia  . budesonide-formoterol (SYMBICORT) 160-4.5 MCG/ACT inhaler INHALE 2 PUFFS INTO THE LUNGS 2 TIMES DAILY  . clobetasol (TEMOVATE) 0.05 % external solution Apply 1 application topically 2 (two) times daily. For 7-10 days up to twice a day maximum. Use only on scalp  . hydrocortisone (ANUSOL-HC) 2.5 % rectal cream Place 1 application rectally 2 times daily as needed for hemorrhoids.  [name]  lisinopril-hydrochlorothiazide (ZESTORETIC) 20-25 MG tablet TAKE 1 TABLET BY MOUTH EVERY DAY  . meclizine (ANTIVERT) 25 MG tablet TAKE 1 TABLET BY MOUTH 2 TIMES DAILY AS NEEDED FOR vertigo OR dizziness.  [name] omeprazole (PRILOSEC) 20 MG capsule Take 1 capsule (20 mg total) by mouth daily. Please schedule a physical with Dr. Yong Channel for further refills [phone]  . PARoxetine (PAXIL) 20 MG tablet TAKE 1 TABLET BY MOUTH EVERY DAY  . [DISCONTINUED] desloratadine (CLARINEX) 5 MG tablet TAKE 1 TABLET BY MOUTH EVERY DAY (Patient not taking: Reported on 11/02/2020)   No facility-administered encounter medications on file as of 11/02/2020.    Allergies (verified) Erythromycin, Fentanyl, Heparin, Hydrocodone-acetaminophen, Meperidine hcl, Penicillins, Procaine hcl, Promethazine hcl, and Temazepam   History: Past Medical History:  Diagnosis Date  . Abnormal uterine bleeding   . Allergic rhinitis   . Allergy    seasonal  . Anxiety    panic attacks  . Anxiety disorder   . Asthma   . Cataract    left eye surgery to removed  . Cataract    right eye  . Colorectal cancer ([place]) 1997   T3, N0  . Depression    pt states she isn't depressed anymore, continues paxil daily  . Endometriosis   . Fibroid   . GERD (gastroesophageal reflux disease)   . Hemorrhoids   . Hyperlipidemia    diet controlled, no meds  . Hypertension   . PONV (postoperative nausea and vomiting)   . Prediabetes   . Seasonal allergies   . SVD (spontaneous  vaginal delivery)    x 3  . Thyroid disease   . Urinary incontinence    Past Surgical History:  Procedure Laterality Date  . cataract surgery Left   . COLECTOMY  1997   SIGMOID COLECTOMY 1997  . COLON SURGERY  1997  . COLONOSCOPY    . DILATATION & CURETTAGE/HYSTEROSCOPY WITH MYOSURE N/A 11/21/2015   Procedure: DILATATION & CURETTAGE/HYSTEROSCOPY WITH MYOSURE;  Surgeon: Salvadore Dom, MD;  Location: [facility];  Service: Gynecology;  Laterality: N/A;  . DILATION AND  CURETTAGE OF UTERUS     D & C  . POLYPECTOMY    . TONSILLECTOMY     Family History  Problem Relation Age of Onset  . Sudden death Brother 37       Apparent DVT/PE. all males  . Heart attack Brother   . Varicose Veins Mother   . Hypertension Father   . Sudden death Cousin 50       Paternal cousin -dvt/pe 7 all males  . Coronary artery disease Cousin 80       Paternal cousin, MI  . Breast cancer Daughter 22  . Stroke Maternal Grandmother   . Stroke Maternal Grandfather   . Early death Paternal Grandmother   . Tuberculosis Paternal Grandmother   . Early death Paternal Grandfather   . Tuberculosis Paternal Grandfather   . Hypertension Son   . Colon cancer Neg Hx   . Rectal cancer Neg Hx   . Stomach cancer Neg Hx    Social History   Socioeconomic History  . Marital status: Divorced    Spouse name: Not on file  . Number of children: Not on file  . Years of education: Not on file  . Highest education level: Not on file  Occupational History  . Occupation: part time  Tobacco Use  . Smoking status: Never Smoker  . Smokeless tobacco: Never Used  Vaping Use  . Vaping Use: Never used  Substance and Sexual Activity  . Alcohol use: Yes    Alcohol/week: 0.0 standard drinks    Comment: occasional wine  . Drug use: No  . Sexual activity: Not Currently    Partners: Male    Birth control/protection: Post-menopausal  Other Topics Concern  . Not on file  Social History Narrative   Divorced. Lives alone.  Two children. 2 granddaughters (47 and 22 years old in 2016)   Family of 7 including ex-husband      Shows houses (Artist- Radiographer, therapeutic and rental properties)- shows rentals      Hobbies: painting- enjoys classes, writing, photography, sewing   Social Determinants of Health   Financial Resource Strain: Low Risk   . Difficulty of Paying Living Expenses: Not hard at all  Food Insecurity: No Food Insecurity  . Worried About Charity fundraiser in the Last Year:  Never true  . Ran Out of Food in the Last Year: Never true  Transportation Needs: No Transportation Needs  . Lack of Transportation (Medical): No  . Lack of Transportation (Non-Medical): No  Physical Activity: Inactive  . Days of Exercise per Week: 0 days  . Minutes of Exercise per Session: 0 min  Stress: No Stress Concern Present  . Feeling of Stress : Only a little  Social Connections: Moderately Isolated  . Frequency of Communication with Friends and Family: More than three times a week  . Frequency of Social Gatherings with Friends and Family: More than three times a week  . Attends Religious  Services: More than 4 times per year  . Active Member of Clubs or Organizations: No  . Attends Archivist Meetings: Never  . Marital Status: Divorced    Tobacco Counseling Counseling given: Not Answered   Clinical Intake:  Pre-visit preparation completed: Yes  Pain : No/denies pain Pain Location: Other (Comment) (burning during urination) Pain Descriptors / Indicators: Burning Pain Onset: 1 to 4 weeks ago Pain Frequency: Constant     BMI - recorded: 30.3 Nutritional Status: BMI > 30  Obese Nutritional Risks: None Diabetes: No  How often do you need to have someone help you when you read instructions, pamphlets, or other written materials from your doctor or pharmacy?: 1 - Never  Diabetic?No  Interpreter Needed?: No  Information entered by :: Charlott Rakes, LPN   Activities of Daily Living In your present state of health, do you have any difficulty performing the following activities: 11/02/2020  Hearing? N  Vision? N  Difficulty concentrating or making decisions? N  Walking or climbing stairs? N  Dressing or bathing? N  Doing errands, shopping? N  Preparing Food and eating ? N  Using the Toilet? N  In the past six months, have you accidently leaked urine? Y  Comment couging hard  Do you have problems with loss of bowel control? N  Managing your  Medications? N  Managing your Finances? N  Housekeeping or managing your Housekeeping? N  Some recent data might be hidden    Patient Care Team: Marin Olp, MD as PCP - General (Family Medicine) Salvadore Dom, MD as Consulting Physician (Obstetrics and Gynecology) Landis Martins, DPM as Consulting Physician (Podiatry) Burden, Lincoln Brigham, MD as Consulting Physician (Ophthalmology) Druscilla Brownie, MD as Consulting Physician (Dermatology)  Indicate any recent Medical Services you may have received from other than [facility] providers in the past year (date may be approximate).     Assessment:   This is a routine wellness examination for [place].  Hearing/Vision screen  Hearing Screening   125Hz  250Hz  500Hz  1000Hz  2000Hz  3000Hz  4000Hz  6000Hz  8000Hz   Right ear:           Left ear:           Comments: No denies any hearing   Vision Screening Comments: Pt follows up with new provider for eye exams  Dietary issues and exercise activities discussed: Current Exercise Habits: The patient does not participate in regular exercise at present  Goals    . Patient Stated     Lose weight and get back to exercise     . Weight (lb) < 200 lb (90.7 kg)     Wants to be more physically active and lose some weight      Depression Screen PHQ 2/9 Scores 11/02/2020 08/15/2020 07/27/2019 01/25/2019 06/10/2018 07/15/2017 09/04/2015  PHQ - 2 Score 0 0 0 0 0 1 0  PHQ- 9 Score - - - 3 - - -    Fall Risk Fall Risk  11/02/2020 08/11/2019 07/27/2019 06/10/2018 07/15/2017  Falls in the past year? 0 0 0 No No  Comment - Emmi Telephone Survey: data to providers prior to load - - -  Number falls in past yr: 0 - 0 - -  Injury with Fall? 0 - 0 - -  Risk for fall due to : Impaired vision;Impaired balance/gait - - - -  Follow up Falls prevention discussed - - - -    FALL RISK PREVENTION PERTAINING TO THE HOME:  Any stairs in  or around the home? No  If so, are there any without handrails? No  Home  free of loose throw rugs in walkways, pet beds, electrical cords, etc? Yes  Adequate lighting in your home to reduce risk of falls? Yes   ASSISTIVE DEVICES UTILIZED TO PREVENT FALLS:  Life alert? No  Use of a cane, walker or w/c? No  Grab bars in the bathroom? Yes  Shower chair or bench in shower? No  Elevated toilet seat or a handicapped toilet? No   TIMED UP AND GO:  Was the test performed? Yes .  Length of time to ambulate 10 feet: 10 sec.   Gait steady and fast without use of assistive device  Cognitive Function:     6CIT Screen 11/02/2020 07/27/2019  What Year? 0 points 0 points  What month? 0 points 0 points  What time? - 0 points  Count back from 20 0 points 0 points  Months in reverse 0 points 0 points  Repeat phrase 0 points 0 points  Total Score - 0    Immunizations Immunization History  Administered Date(s) Administered  . Pneumococcal Conjugate-13 08/09/2014  . Pneumococcal Polysaccharide-23 10/19/2015    TDAP status: Due, Education has been provided regarding the importance of this vaccine. Advised may receive this vaccine at local pharmacy or Health Dept. Aware to provide a copy of the vaccination record if obtained from local pharmacy or Health Dept. Verbalized acceptance and understanding.  Flu Vaccine status: Due, Education has been provided regarding the importance of this vaccine. Advised may receive this vaccine at local pharmacy or Health Dept. Aware to provide a copy of the vaccination record if obtained from local pharmacy or Health Dept. Verbalized acceptance and understanding.  Pneumococcal vaccine status: Up to date  Covid-19 vaccine status: Declined, Education has been provided regarding the importance of this vaccine but patient still declined. Advised may receive this vaccine at local pharmacy or Health Dept.or vaccine clinic. Aware to provide a copy of the vaccination record if obtained from local pharmacy or Health Dept. Verbalized acceptance  and understanding.  Qualifies for Shingles Vaccine? Yes   Zostavax completed No   Shingrix Completed?: No.    Education has been provided regarding the importance of this vaccine. Patient has been advised to call insurance company to determine out of pocket expense if they have not yet received this vaccine. Advised may also receive vaccine at local pharmacy or Health Dept. Verbalized acceptance and understanding.  Screening Tests Health Maintenance  Topic Date Due  . COVID-19 Vaccine (1) Never done  . TETANUS/TDAP  08/25/2029 (Originally 09/08/1954)  . INFLUENZA VACCINE  08/07/2035 (Originally 04/16/2020)  . DEXA SCAN  Completed  . PNA vac Low Risk Adult  Completed    Health Maintenance  Health Maintenance Due  Topic Date Due  . COVID-19 Vaccine (1) Never done    Colorectal cancer screening: No longer required.   Mammogram status: No longer required due to age.  Bone Density status: Completed 09/27/15. Results reflect: Bone density results: OSTEOPENIA. Repeat every 2 years.   Additional Screening:  Vision Screening: Recommended annual ophthalmology exams for early detection of glaucoma and other disorders of the eye. Is the patient up to date with their annual eye exam?  Yes  Who is the provider or what is the name of the office in which the patient attends annual eye exams? Starting with new provider  If pt is not established with a provider, would they like to be referred to  a provider to establish care? No .   Dental Screening: Recommended annual dental exams for proper oral hygiene  Community Resource Referral / Chronic Care Management: CRR required this visit?  No   CCM required this visit?  No      Plan:     I have personally reviewed and noted the following in the patient's chart:   . Medical and social history . Use of alcohol, tobacco or illicit drugs  . Current medications and supplements . Functional ability and status . Nutritional status . Physical  activity . Advanced directives . List of other physicians . Hospitalizations, surgeries, and ER visits in previous 12 months . Vitals . Screenings to include cognitive, depression, and falls . Referrals and appointments  In addition, I have reviewed and discussed with patient certain preventive protocols, quality metrics, and best practice recommendations. A written personalized care plan for preventive services as well as general preventive health recommendations were provided to patient.     Willette Brace, LPN   1/61/0960   Nurse Notes: None

## 2020-11-06 NOTE — Progress Notes (Signed)
Phone [phone] In person visit   Subjective:   Tammie [name] is a 86 y.o. year old very pleasant female patient who presents for/with See problem oriented charting Chief Complaint  Patient presents with   burning with urination     3 weeks ago on and off    This visit occurred during the SARS-CoV-2 public health emergency.  Safety protocols were in place, including screening questions prior to the visit, additional usage of staff PPE, and extensive cleaning of exam room while observing appropriate contact time as indicated for disinfecting solutions.   Past Medical History-  Patient Active Problem List   Diagnosis Date Noted   Prediabetes     Priority: High   Hyperglycemia 07/15/2017    Priority: High   Osteopenia 10/02/2015    Priority: High   Hyperlipidemia 01/15/2018    Priority: Medium   Vertigo 07/15/2017    Priority: Medium   Hypertension     Priority: Medium   Panic attacks     Priority: Medium   History of colorectal cancer     Priority: Medium   Asthma     Priority: Medium   Left leg pain 12/20/2016    Priority: Low   Hemorrhoids 12/20/2016    Priority: Low   Dermatitis 12/20/2016    Priority: Low   Systolic murmur 09/81/1914    Priority: Low   Allergic rhinitis     Priority: Low   GERD 06/22/2009    Priority: Low    Medications- reviewed and updated Current Outpatient Medications  Medication Sig Dispense Refill   amLODipine (NORVASC) 10 MG tablet TAKE 1 TABLET BY MOUTH EVERY DAY 90 tablet 3   augmented betamethasone dipropionate (DIPROLENE-AF) 0.05 % cream apply topically TO affected AREAS UP TO 2 TIMES DAILY AS NEEDED. AVOID FACE, GROIN, AND axilia     budesonide-formoterol (SYMBICORT) 160-4.5 MCG/ACT inhaler INHALE 2 PUFFS INTO THE LUNGS 2 TIMES DAILY 10.2 g 5   Chlorpheniramine-DM (CORICIDIN HBP COUGH/COLD PO) Take by mouth.     clobetasol (TEMOVATE) 0.05 % external solution Apply 1 application topically 2 (two) times  daily. For 7-10 days up to twice a day maximum. Use only on scalp 50 mL 0   hydrocortisone (ANUSOL-HC) 2.5 % rectal cream Place 1 application rectally 2 times daily as needed for hemorrhoids. 28.35 g 1   lisinopril-hydrochlorothiazide (ZESTORETIC) 20-25 MG tablet TAKE 1 TABLET BY MOUTH EVERY DAY 90 tablet 1   meclizine (ANTIVERT) 25 MG tablet TAKE 1 TABLET BY MOUTH 2 TIMES DAILY AS NEEDED FOR vertigo OR dizziness. 30 tablet 0   omeprazole (PRILOSEC) 20 MG capsule Take 1 capsule (20 mg total) by mouth daily. Please schedule a physical with Dr. Yong Channel for further refills [phone] 90 capsule 0   PARoxetine (PAXIL) 20 MG tablet TAKE 1 TABLET BY MOUTH EVERY DAY 90 tablet 1   albuterol (VENTOLIN HFA) 108 (90 Base) MCG/ACT inhaler Inhale 2 puffs into the lungs every 6 hours as needed for wheezing or shortness of breath. (Patient not taking: Reported on 11/07/2020) 8.5 g 2   No current facility-administered medications for this visit.     Objective:  BP (!) 144/66    Pulse (!) 56    Temp 98.4 F (36.9 C) (Temporal)    Ht 5\' 3"  (1.6 m)    Wt 173 lb 6.4 oz (78.7 kg)    SpO2 95%    BMI 30.72 kg/m  Gen: NAD, resting comfortably CV: RRR no murmurs rubs or gallops Lungs:  CTAB no crackles, wheeze, rhonchi Abdomen: soft/nontender/nondistended/normal bowel sounds. No rebound or guarding. No suprapubic pain or cva tenderness Ext: no edema Skin: warm, dry    Assessment and Plan  #Concern for UTI S: Patients symptoms started 3 weeks ago.  Complains of dysuria:  On and off for 3 weeks- mild then would go away; polyuria: slightly; nocturia: stable from baseline; urgency: some- has had to wear pad.  Symptoms are the same fromm starting.  ROS- no fever, chills, nausea, vomiting, flank pain. No blood in urine.  A/P: UA - patient having trouble obtaining this but has not had many fluids today- we gave her water to help (she has peeded twice today). Likely UTI. Will get culture. Empiric treatment - she  opts out.  Patient to follow up if new or worsening symptoms or failure to improve.   #hypertension S: medication: amlodipine 10 mg, lisinopril hctz 20-25 mg Home readings #s: 130s over 50s or 60s- occasionally 140s BP Readings from Last 3 Encounters:  11/07/20 (!) 144/66  11/02/20 136/65  08/16/20 (!) 144/77  A/P: mild poor control but not feeling great today- was controlled just last week at [facility]- we will monitor this and continue current meds for now  Recommended follow up:  Physical within 4 months Future Appointments  Date Time Provider [place]  11/08/2021  2:30 PM [facility]   Lab/Order associations:   ICD-10-CM   1. Burning with urination  R30.0 Urine Culture    POCT Urinalysis Dipstick (Automated)  2. Primary hypertension  I10     No orders of the defined types were placed in this encounter.   Return precautions advised.  [name], MD

## 2020-11-06 NOTE — Patient Instructions (Addendum)
Please stop by lab before you go If you have mychart- we will send your results within 3 business days of [place] receiving them.  If you do not have mychart- we will call you about results within 5 business days of [place] receiving them.  *please also note that you will see labs on mychart as soon as they post. I will later go in and write notes on them- will say "notes from Dr. Yong Channel"  Possible UTI- we need to get the initial urine and culture- we can give you as many bottles of water as you need and get a urine before you leave.   Recommended follow up:  Physical within 4 months

## 2020-11-07 ENCOUNTER — Other Ambulatory Visit: Payer: Self-pay

## 2020-11-07 ENCOUNTER — Ambulatory Visit (INDEPENDENT_AMBULATORY_CARE_PROVIDER_SITE_OTHER): Payer: PPO | Admitting: Family Medicine

## 2020-11-07 ENCOUNTER — Encounter: Payer: Self-pay | Admitting: Family Medicine

## 2020-11-07 VITALS — BP 144/66 | HR 56 | Temp 98.4°F | Ht 63.0 in | Wt 173.4 lb

## 2020-11-07 DIAGNOSIS — I10 Essential (primary) hypertension: Secondary | ICD-10-CM

## 2020-11-07 DIAGNOSIS — R300 Dysuria: Secondary | ICD-10-CM

## 2020-11-07 LAB — POC URINALSYSI DIPSTICK (AUTOMATED)
Bilirubin, UA: NEGATIVE
Blood, UA: NEGATIVE
Glucose, UA: NEGATIVE
Ketones, UA: NEGATIVE
Nitrite, UA: NEGATIVE
Protein, UA: NEGATIVE
Spec Grav, UA: 1.01 (ref 1.010–1.025)
Urobilinogen, UA: 0.2 E.U./dL
pH, UA: 7.5 (ref 5.0–8.0)

## 2020-11-07 NOTE — Addendum Note (Signed)
Addended by: Brandy Hale on: 11/07/2020 03:14 PM   Modules accepted: Orders

## 2020-11-08 LAB — URINE CULTURE
MICRO NUMBER:: 11564246
SPECIMEN QUALITY:: ADEQUATE

## 2020-11-16 DIAGNOSIS — H35033 Hypertensive retinopathy, bilateral: Secondary | ICD-10-CM | POA: Diagnosis not present

## 2020-11-16 DIAGNOSIS — H524 Presbyopia: Secondary | ICD-10-CM | POA: Diagnosis not present

## 2020-12-08 ENCOUNTER — Other Ambulatory Visit: Payer: Self-pay | Admitting: Family Medicine

## 2020-12-08 DIAGNOSIS — J4541 Moderate persistent asthma with (acute) exacerbation: Secondary | ICD-10-CM

## 2021-01-08 ENCOUNTER — Telehealth: Payer: Self-pay

## 2021-01-08 NOTE — Telephone Encounter (Signed)
Patient is asking If she is able to use flonase or another nasal spray for her allergies Please advise

## 2021-01-09 NOTE — Telephone Encounter (Signed)
Called and lm on pt vm making her aware that she can use OTC nasal spray for her allergies.

## 2021-01-22 ENCOUNTER — Other Ambulatory Visit: Payer: Self-pay | Admitting: Family Medicine

## 2021-01-29 ENCOUNTER — Other Ambulatory Visit: Payer: Self-pay | Admitting: Family Medicine

## 2021-04-11 ENCOUNTER — Telehealth: Payer: Self-pay

## 2021-04-11 NOTE — Telephone Encounter (Signed)
Patient calling back to discuss vertigo- I advised her to go to urgent care and she has agreed to do this

## 2021-04-11 NOTE — Telephone Encounter (Signed)
Patient called in stating she had bad vertigo last week and wanted to speak with someone about her symptoms and see if it was medication related. Advised we need to schedule an appointment, she declined because she is still very dizzy. Explained to patient I was going to have her speak with triage nurse. Waiting on triage note.

## 2021-04-11 NOTE — Telephone Encounter (Signed)
Caller states she has vertigo and Friday morning when she woke up she was dizzy and is still not feeling stable . Caller states she was stumbling . Caller states she is still lightheaded and dizzy. Caller also states she has a feeling that someone is clamping on her head like a headache and this is how she felt when she had anxiety. [place] Not Listed UC Translation No Nurse Assessment Nurse: Altamease Oiler, RN, Adriana Date/Time (Eastern Time): 04/11/2021 11:34:05 AM Confirm and document reason for call. If symptomatic, describe symptoms. ---pt states she has been dizzy since friday. has gotten better. bp of 151/67. Does the patient have any new or worsening symptoms? ---Yes Will a triage be completed? ---Yes Related visit to physician within the last 2 weeks? ---No Does the PT have any chronic conditions? (i.e. diabetes, asthma, this includes High risk factors for pregnancy, etc.) ---Yes List chronic conditions. ---htn Is this a behavioral health or substance abuse call? ---No Guidelines Guideline Title Affirmed Question Affirmed Notes Nurse Date/Time (Eastern Time) Dizziness - Lightheadedness SEVERE dizziness (e.g., unable to stand, requires support Sarajane Marek 04/11/2021 11:38:54 AM PLEASE NOTE: All timestamps contained within this report are represented as [place] Standard Time. CONFIDENTIALTY NOTICE: This fax transmission is intended only for the addressee. It contains information that is legally privileged, confidential or otherwise protected from use or disclosure. If you are not the intended recipient, you are strictly prohibited from reviewing, disclosing, copying using or disseminating any of this information or taking any action in reliance on or regarding this information. If you have received this fax in error, please notify us immediately by telephone so that we can arrange for its return to [place]. Phone: [phone], Toll-Free: [phone], Fax:  [phone] Page: 2 of 2 Call Id: [id number] Guidelines Guideline Title Affirmed Question Affirmed Notes Nurse Date/Time Eilene Ghazi Time) to walk, feels like passing out now) Disp. Time Eilene Ghazi Time) Disposition Final User 04/11/2021 11:01:05 AM Attempt made - message left Altamease Oiler RN, Fabio Bering 04/11/2021 11:19:32 AM Attempt made - no message left Crist Fat, Fabio Bering 04/11/2021 11:42:30 AM Go to ED Now (or PCP triage) Yes Altamease Oiler, RN, Adriana Caller Disagree/Comply Disagree Caller Understands Yes PreDisposition Call Doctor Care Advice Given Per Guideline GO TO ED NOW (OR PCP TRIAGE): * IF NO PCP (PRIMARY CARE PROVIDER) SECOND-LEVEL TRIAGE: You need to be seen within the next hour. Go to the [facility] at _____________ [place] as soon as you can. ANOTHER ADULT SHOULD DRIVE: * It is better and safer if another adult drives instead of you. CARE ADVICE given per Dizziness (Adult) guideline. Comments User: Kizzie Fantasia, RN Date/Time Eilene Ghazi Time): 04/11/2021 11:46:14 AM spoke to office and informed them pt did not want to go to [place]

## 2021-04-16 ENCOUNTER — Other Ambulatory Visit: Payer: Self-pay

## 2021-04-16 ENCOUNTER — Ambulatory Visit (INDEPENDENT_AMBULATORY_CARE_PROVIDER_SITE_OTHER): Payer: PPO | Admitting: Family Medicine

## 2021-04-16 ENCOUNTER — Encounter: Payer: Self-pay | Admitting: Family Medicine

## 2021-04-16 ENCOUNTER — Other Ambulatory Visit: Payer: Self-pay | Admitting: Family Medicine

## 2021-04-16 ENCOUNTER — Ambulatory Visit: Payer: PPO | Admitting: Physician Assistant

## 2021-04-16 VITALS — BP 154/70 | HR 60 | Temp 98.7°F | Ht 63.0 in | Wt 172.6 lb

## 2021-04-16 DIAGNOSIS — H8112 Benign paroxysmal vertigo, left ear: Secondary | ICD-10-CM

## 2021-04-16 DIAGNOSIS — I10 Essential (primary) hypertension: Secondary | ICD-10-CM | POA: Diagnosis not present

## 2021-04-16 NOTE — Patient Instructions (Addendum)
Top blood pressure  number is poorly controlled today but recent stressor loss of dog and vertigo issues- we opted to have her do some home monitoring over next 3 weeks (particularly after vertigo is better) and she will update me at that [facility]- for now continue current meds. She is concerned meds could cause dizziness which is possible but on other hand we really need to control blood pressure to reduce stroke risk at minimum <150/90 though I prefer <140/90 if possible.   Recommended follow up: Return in about 4 months (around 08/16/2021) for physical or sooner if needed. Particularly for new or worsening symptoms including worsening vertigo, new hearing loss or ringing in the ear

## 2021-04-16 NOTE — Progress Notes (Signed)
Phone [phone] In person visit   Subjective:   Tammie [name] is a 86 y.o. year old very pleasant female patient who presents for/with See problem oriented charting Chief Complaint  Patient presents with   Dizziness    Started a couple of weeks ago, with vomiting. She has taken Dramamine for symptoms, but still have not subsided.    Ear Pain    Left ear is impacted.    This visit occurred during the SARS-CoV-2 public health emergency.  Safety protocols were in place, including screening questions prior to the visit, additional usage of staff PPE, and extensive cleaning of exam room while observing appropriate contact time as indicated for disinfecting solutions.   Past Medical History-  Patient Active Problem List   Diagnosis Date Noted   Prediabetes     Priority: High   Hyperglycemia 07/15/2017    Priority: High   Osteopenia 10/02/2015    Priority: High   Hyperlipidemia 01/15/2018    Priority: Medium   Vertigo 07/15/2017    Priority: Medium   Hypertension     Priority: Medium   Panic attacks     Priority: Medium   History of colorectal cancer     Priority: Medium   Asthma     Priority: Medium   Left leg pain 12/20/2016    Priority: Low   Hemorrhoids 12/20/2016    Priority: Low   Dermatitis 12/20/2016    Priority: Low   Systolic murmur [id number]    Priority: Low   Allergic rhinitis     Priority: Low   GERD 06/22/2009    Priority: Low    Medications- reviewed and updated Current Outpatient Medications  Medication Sig Dispense Refill   amLODipine (NORVASC) 10 MG tablet TAKE 1 TABLET BY MOUTH EVERY DAY 90 tablet 3   augmented betamethasone dipropionate (DIPROLENE-AF) 0.05 % cream apply topically TO affected AREAS UP TO 2 TIMES DAILY AS NEEDED. AVOID FACE, GROIN, AND axilia     budesonide-formoterol (SYMBICORT) 160-4.5 MCG/ACT inhaler INHALE 2 PUFFS into lungs 2 TIMES DAILY 10.2 g 5   Chlorpheniramine-DM (CORICIDIN HBP COUGH/COLD PO) Take by mouth.      clobetasol (TEMOVATE) 0.05 % external solution Apply 1 application topically 2 (two) times daily. For 7-10 days up to twice a day maximum. Use only on scalp 50 mL 0   hydrocortisone (ANUSOL-HC) 2.5 % rectal cream Place 1 application rectally 2 times daily as needed for hemorrhoids. 28.35 g 1   lisinopril-hydrochlorothiazide (ZESTORETIC) 20-25 MG tablet TAKE 1 TABLET BY MOUTH EVERY DAY 90 tablet 1   meclizine (ANTIVERT) 25 MG tablet TAKE 1 TABLET BY MOUTH 2 TIMES DAILY AS NEEDED FOR vertigo OR dizziness. 30 tablet 0   omeprazole (PRILOSEC) 20 MG capsule TAKE 1 CAPSULE BY MOUTH EVERY DAY **NEED APPOINTMENT FOR MORE REFILLS** 90 capsule 0   PARoxetine (PAXIL) 20 MG tablet TAKE 1 TABLET BY MOUTH EVERY DAY 90 tablet 1   albuterol (VENTOLIN HFA) 108 (90 Base) MCG/ACT inhaler Inhale 2 puffs into the lungs every 6 hours as needed for wheezing or shortness of breath. (Patient not taking: No sig reported) 8.5 g 2   No current facility-administered medications for this visit.     Objective:  BP (!) 154/70   Pulse 60   Temp 98.7 F (37.1 C) (Temporal)   Ht '5\' 3"'$  (1.6 m)   Wt 172 lb 9.6 oz (78.3 kg)   SpO2 97%   BMI 30.57 kg/m  Gen: NAD, resting comfortably CV: RRR  no rubs or gallops Lungs: CTAB no crackles, wheeze, rhonchi Ext: trace edema Skin: warm, dry Neuro: Neuro: CN II-XII intact, sensation and reflexes normal throughout, 5/5 muscle strength in bilateral upper and lower extremities. Normal finger to nose. Normal rapid alternating movements. No pronator drift. Normal romberg. Normal gait.  - has cane but not needing at present- using in case has recurrent symptoms    Assessment and Plan   #Vertigo/dizziness S: Patient with history of vertigo/dizziness dating back to at least 2018.  She has had meclizine on hand in the past for potential BPPV-previously with head movement or quick position changes.  Improved when she states still.  Has declined physical therapy previously.  No neuroimaging  has been performed. In past usually would improve with 3 days.   Patient called on 04/11/2021 and appears was advised to visit an urgent care (apparently we did not have any openings).  She reports she considered going to urgent care but ended up not going   - symptoms started 11 days ago  came on severe- hit her getting out of bed going to bathroom- severe vertigo- barely made it to her kitchen. If moved head symptoms recurred, if stabilized symptoms would resolve within a minute maximum- looking straight ahead would be most helpful.  - she reports much more persistent this time though improving.  -she declines headache- had some pressure in sinuses with barometric pressure changes- which is common for her- feels some pressure in left ear -has thrown up twice in whole time. Dramamine did help  - felt much better yesterday morning- today felt better other than bedning over to work on something in the sink -no ringing in ears. Has left ear fullness sensation but chronic congestion . No hearing loss with episode A/P: patient with long term issues with BPPV. No tinnitus to suggest menieres and episodes are short lived with head movement and resolve with keeping head still. I suspect thi sis another episode of BPPV. She declines PT vestibular rehab for now- but will call if she changes her mind. She did not want me to do dix hallpike- nervous about causing recurrence- has nausea with episodes. Bought meclizine- ok to use this.  - she has had a few days of sinus pressure- is using flonase for nasal congestion starting a day or two ago - possible OME though no air fluid level. No tinnitus once again to suggest menieres. OME left and believe BPPV left ear most likely as well.  -reassuring neurological exam   #hypertension S: medication: lisinopril-hctz 20-25 mg, amlodipine 10 mg. Trying to stay well hydrated- feels does pretty well Home readings #s: bp was running higher when having vertigo- most recently at  that time 151/70. At dentist was 73 within last 2 months- was not checking at home much recnetly other than vertigo - lost granddog recently and died in her arms and stress was higher BP Readings from Last 3 Encounters:  04/16/21 (!) 154/70  11/07/20 (!) 144/66  11/02/20 [id number]  A/P: systolic blood pressure is poorly controlled today but recent stressor loss of dog and vertigo issues- we opted to have her do some home monitoring over next 3 weeks (particularly after vertigo is better) and she will update me at that [facility]- for now continue current meds. She is concerned meds could cause dizziness which is possible but on other hand we really need to control blood pressure to reduce stroke risk at minimum <150/90 though I prefer <140/90 if possible.  -some dry  cough may want to try ARB instead of ACE-i  Recommended follow up: Return in about 4 months (around 08/16/2021) for physical or sooner if needed. Future Appointments  Date Time Provider [place]  11/08/2021  2:30 PM [facility]    Lab/Order associations:   ICD-10-CM   1. Benign paroxysmal positional vertigo of left ear  H81.12     2. Primary hypertension  I10      Return precautions advised.  [name], MD

## 2021-05-04 ENCOUNTER — Telehealth (INDEPENDENT_AMBULATORY_CARE_PROVIDER_SITE_OTHER): Payer: PPO | Admitting: Physician Assistant

## 2021-05-04 VITALS — Ht <= 58 in

## 2021-05-04 DIAGNOSIS — H8112 Benign paroxysmal vertigo, left ear: Secondary | ICD-10-CM | POA: Diagnosis not present

## 2021-05-04 DIAGNOSIS — J0110 Acute frontal sinusitis, unspecified: Secondary | ICD-10-CM

## 2021-05-04 MED ORDER — AZITHROMYCIN 250 MG PO TABS: ORAL_TABLET | ORAL | 0 refills | Status: DC

## 2021-05-04 NOTE — Patient Instructions (Signed)
Recheck as needed °

## 2021-05-04 NOTE — Progress Notes (Signed)
Virtual Visit via Video Note  I connected with  Tammie [name]  on 05/04/21 at  4:00 PM EDT by a video enabled telemedicine application and verified that I am speaking with the correct person using two identifiers.  Location: Patient: home Provider: Therapist, music at [place] present: Patient and myself   I discussed the limitations of evaluation and management by telemedicine and the availability of in person appointments. The patient expressed understanding and agreed to proceed.   History of Present Illness: Chief complaint: Sinus pressure, room spinning sensation vertigo, hoarse Symptom onset: 3 weeks ago Pertinent positives: Nasal congestion, postnasal drip Pertinent negatives: ST, body aches, chills, cough, fever Treatments tried: Flonase Sick exposure: None    Observations/Objective:   Gen: Awake, alert, no acute distress Resp: Breathing is even and non-labored Psych: calm/pleasant demeanor Neuro: Alert and Oriented x 3, + facial symmetry, speech is clear.   Assessment and Plan: 1. Benign paroxysmal positional vertigo of left ear 2. Acute frontal sinusitis, recurrence not specified Still having similar symptoms to 04-16-21 when she saw Dr. Yong Channel. Some improvement in the vertigo, but now having sinus symptoms. As this has been going on well more than 2 weeks, will start antibiotic therapy. She has done well with Z-pak in the past, sent Rx for this today. Encouraged her to continue pushing fluids. Use Flonase and nasal saline, daily allergy medication like loratadine 10 mg. She also has meclizine if needed for vertigo. She will recheck in person if worse or no improvement.   Follow Up Instructions:    I discussed the assessment and treatment plan with the patient. The patient was provided an opportunity to ask questions and all were answered. The patient agreed with the plan and demonstrated an understanding of the instructions.   The patient was  advised to call back or seek an in-person evaluation if the symptoms worsen or if the condition fails to improve as anticipated.  Video connection was lost at >50% of the duration of the visit, at which time the remainder of the visit was completed via audio only. Total time spent on phone was 11 min 15 sec.   Rommie Dunn M Cillian Gwinner, PA-C

## 2021-05-15 ENCOUNTER — Telehealth: Payer: Self-pay

## 2021-05-15 NOTE — Telephone Encounter (Signed)
Pt called stating that Dr Yong Channel told her to keep a journal for her blood pressure. She stated that she has a few questions about  what Dr Yong Channel wants. She stated that she has some readings over the past 3 weeks. Irina stated that she is just a little confused on how to keep her journal. She would like to speak to a nurse. Please Advise.

## 2021-05-16 NOTE — Telephone Encounter (Signed)
Called and lm on pt vm tcb. 

## 2021-05-16 NOTE — Telephone Encounter (Signed)
Patient is returning a call from [place]. She states the way she took her blood pressure once a week and then then averaged them all together, wanting to know if that is okay or if she needs to record them daily or at least twice a week.

## 2021-05-17 NOTE — Telephone Encounter (Signed)
Dr. Yong Channel, please see message and blood pressure readings.

## 2021-05-17 NOTE — Telephone Encounter (Signed)
Spoke to pt told her we would like her to take her blood pressure in the morning and record daily and send to [place] once a week. Pt verbalized understanding. Asked pt if she is having any headaches or dizziness? Pt said she is just getting over Vertigo attack and has been having headaches off and on due to sinus pressure. Denies chest pain, SOB of blurred vision. Told pt okay continue to monitor and I will forward what you send to Dr. Yong Channel. Pt verbalized understanding.

## 2021-05-17 NOTE — Telephone Encounter (Signed)
Called and lm for pt tcb, when pt calls back please let her know that she should take and log blood daily and send [place] the log in a week.

## 2021-05-17 NOTE — Telephone Encounter (Signed)
Richrd Humbles- so are you saying she is going to message me with pressures in another week? Or are these the only #s she is submitting?

## 2021-05-17 NOTE — Telephone Encounter (Signed)
BP Readings between 1 and 2 o'clock in the afternoon   8/8 146/64  8/9  137/62  8/10 146/60  8/11 148/58 8/13 141/76 8/15 143/61 8/18 142/58  8/20 145/59 8/22 144/57 8/30 156/64  AVG 143/61

## 2021-05-18 NOTE — Telephone Encounter (Signed)
Perfect thanks- will be on the lookout!

## 2021-05-18 NOTE — Telephone Encounter (Signed)
Dr. Yong Channel, she will sending more in another week.

## 2021-05-28 ENCOUNTER — Telehealth: Payer: Self-pay

## 2021-05-28 NOTE — Telephone Encounter (Signed)
Patient states she last seen dr. Yong Channel she was told to keep track of her BP reading and she has been taking them down but she is concered about her machine she is using and would like to come in to have just a nurse take her BP to make sure she's is correct please advise if Im able to schedule

## 2021-05-29 NOTE — Telephone Encounter (Signed)
Yes, ok to schedule

## 2021-05-29 NOTE — Telephone Encounter (Signed)
Patient scheduled.

## 2021-05-30 ENCOUNTER — Other Ambulatory Visit: Payer: Self-pay

## 2021-05-30 ENCOUNTER — Ambulatory Visit: Payer: PPO

## 2021-05-30 VITALS — BP 173/72

## 2021-05-30 DIAGNOSIS — Z0130 Encounter for examination of blood pressure without abnormal findings: Secondary | ICD-10-CM

## 2021-05-30 NOTE — Progress Notes (Signed)
Pt came in today for BP check. Pt's blood pressure was very faint. 1st reading was 120/60 with manual cuff. 2nd reading was done with her personal BP cuff; reading was 172/72. I used the Dinamap for the 3rd BP which was similar to her personal BP cuff; 173/72. I asked for another ear to listen, Isla Pence got 162/70. Pt brought BP log, they were copied and sent for scanning. Also placed in [place] box for reviewing.

## 2021-05-31 ENCOUNTER — Other Ambulatory Visit: Payer: Self-pay | Admitting: Family Medicine

## 2021-05-31 NOTE — Telephone Encounter (Signed)
Please thank patient for dropping off home blood pressure readings-her averages typically mid 0000000 systolic over mid 0000000 diastolic  It also sounds like she is getting some higher blood pressures at times.  On the other hand she has had lightheadedness with blood pressure running lower.  She also has had dry cough on lisinopril.  Please give her the following option 1.  Continue amlodipine 10 mg 2.  Stop lisinopril hydrochlorothiazide 20-25 mg 3.  Start valsartan hydrochlorothiazide 320-12.5 mg daily #90 with 3 refills-I have pended this medication-you may fill for her if she agrees.  Valsartan is similar to lisinopril but is less likely to cause cough.  If she makes this change I would like for her to update me in another 3 weeks with blood pressures

## 2021-06-01 MED ORDER — VALSARTAN-HYDROCHLOROTHIAZIDE 320-12.5 MG PO TABS: 1.0000 | ORAL_TABLET | Freq: Every day | ORAL | 3 refills | Status: DC

## 2021-06-01 NOTE — Telephone Encounter (Signed)
Below message give to pt via mychart as well as lm on pt vm tcb for below message.

## 2021-06-05 NOTE — Telephone Encounter (Signed)
Pt agreed to change via mychart on 06/01/21.

## 2021-07-18 ENCOUNTER — Ambulatory Visit: Payer: PPO | Admitting: Podiatry

## 2021-07-23 ENCOUNTER — Ambulatory Visit: Payer: PPO | Admitting: Podiatry

## 2021-07-23 ENCOUNTER — Other Ambulatory Visit: Payer: Self-pay | Admitting: Family Medicine

## 2021-07-23 ENCOUNTER — Encounter: Payer: Self-pay | Admitting: Podiatry

## 2021-07-23 ENCOUNTER — Other Ambulatory Visit: Payer: Self-pay

## 2021-07-23 DIAGNOSIS — M792 Neuralgia and neuritis, unspecified: Secondary | ICD-10-CM

## 2021-07-23 DIAGNOSIS — M205X2 Other deformities of toe(s) (acquired), left foot: Secondary | ICD-10-CM

## 2021-07-23 DIAGNOSIS — M205X1 Other deformities of toe(s) (acquired), right foot: Secondary | ICD-10-CM

## 2021-07-23 NOTE — Progress Notes (Signed)
  Subjective:  Patient ID: Tammie [name], female    DOB: 26-Jan-1935,   MRN: [id number]  Chief Complaint  Patient presents with   Foot Pain    The right foot rolls outward and I have some sciatic nerve for about 2 years and numbness and tingling and there is not any swelling    86 y.o. female presents for concern of nerve pain in her feet. Relates she was seen 2 years ago with Dr. Cannon Kettle and told she had some capsulitis vs neuritis. Relates it resolved and now having similar shooting, burning and numbness on the tops of her feet to her toes . Denies any other pedal complaints. Denies n/v/f/c.   Past Medical History:  Diagnosis Date   Abnormal uterine bleeding    Allergic rhinitis    Allergy    seasonal   Anxiety    panic attacks   Anxiety disorder    Asthma    Cataract    left eye surgery to removed   Cataract    right eye   Colorectal cancer ([place]) 1997   T3, N0   Depression    pt states she isn't depressed anymore, continues paxil daily   Endometriosis    Fibroid    GERD (gastroesophageal reflux disease)    Hemorrhoids    Hyperlipidemia    diet controlled, no meds   Hypertension    PONV (postoperative nausea and vomiting)    Prediabetes    Seasonal allergies    SVD (spontaneous vaginal delivery)    x 3   Thyroid disease    Urinary incontinence     Objective:  Physical Exam: Vascular: DP/PT pulses 2/4 bilateral. CFT <3 seconds. Normal hair growth on digits. No edema.  Skin. No lacerations or abrasions bilateral feet.  Musculoskeletal: MMT 5/5 bilateral lower extremities in DF, PF, Inversion and Eversion. Deceased ROM in DF of ankle joint.  Tender over first MTPJ with pain on ROM.  Neurological: Sensation intact to light touch.   Assessment:   1. Neuritis   2. Hallux limitus of right foot   3. Hallux limitus of left foot      Plan:  Patient was evaluated and treated and all questions answered. Discussed neuropathy and etiology as well as treatment with  patient.  -Discussed and educated patient on foot care, especially with  regards to the vascular, neurological and musculoskeletal systems.  -Discussed supportive shoes at all times and checking feet regularly.  -Continue with topical creams  -Patient to return as needed.     Lorenda Peck, DPM

## 2021-09-15 ENCOUNTER — Other Ambulatory Visit: Payer: Self-pay | Admitting: Family Medicine

## 2021-09-15 DIAGNOSIS — F411 Generalized anxiety disorder: Secondary | ICD-10-CM

## 2021-09-27 ENCOUNTER — Other Ambulatory Visit: Payer: Self-pay | Admitting: Family Medicine

## 2021-10-03 ENCOUNTER — Other Ambulatory Visit: Payer: Self-pay | Admitting: Family Medicine

## 2021-10-03 DIAGNOSIS — J4541 Moderate persistent asthma with (acute) exacerbation: Secondary | ICD-10-CM

## 2021-10-17 ENCOUNTER — Other Ambulatory Visit: Payer: Self-pay | Admitting: Family Medicine

## 2021-10-26 ENCOUNTER — Encounter (HOSPITAL_BASED_OUTPATIENT_CLINIC_OR_DEPARTMENT_OTHER): Payer: Self-pay | Admitting: Emergency Medicine

## 2021-10-26 ENCOUNTER — Telehealth: Payer: Self-pay | Admitting: Physician Assistant

## 2021-10-26 ENCOUNTER — Ambulatory Visit (INDEPENDENT_AMBULATORY_CARE_PROVIDER_SITE_OTHER): Payer: PPO | Admitting: Physician Assistant

## 2021-10-26 ENCOUNTER — Other Ambulatory Visit: Payer: Self-pay

## 2021-10-26 ENCOUNTER — Emergency Department (HOSPITAL_BASED_OUTPATIENT_CLINIC_OR_DEPARTMENT_OTHER): Payer: PPO

## 2021-10-26 ENCOUNTER — Emergency Department (HOSPITAL_BASED_OUTPATIENT_CLINIC_OR_DEPARTMENT_OTHER)
Admission: EM | Admit: 2021-10-26 | Discharge: 2021-10-26 | Disposition: A | Discharge status: Home / Self Care | Payer: PPO | Attending: Emergency Medicine | Admitting: Emergency Medicine

## 2021-10-26 ENCOUNTER — Encounter: Payer: Self-pay | Admitting: Physician Assistant

## 2021-10-26 VITALS — BP 136/65 | HR 64 | Temp 98.2°F | Ht <= 58 in | Wt 175.2 lb

## 2021-10-26 DIAGNOSIS — M7989 Other specified soft tissue disorders: Secondary | ICD-10-CM

## 2021-10-26 DIAGNOSIS — R609 Edema, unspecified: Secondary | ICD-10-CM

## 2021-10-26 DIAGNOSIS — R600 Localized edema: Secondary | ICD-10-CM | POA: Insufficient documentation

## 2021-10-26 DIAGNOSIS — Z79899 Other long term (current) drug therapy: Secondary | ICD-10-CM | POA: Diagnosis not present

## 2021-10-26 DIAGNOSIS — Z8249 Family history of ischemic heart disease and other diseases of the circulatory system: Secondary | ICD-10-CM

## 2021-10-26 DIAGNOSIS — M79662 Pain in left lower leg: Secondary | ICD-10-CM | POA: Diagnosis not present

## 2021-10-26 NOTE — Progress Notes (Signed)
Subjective:    Patient ID: Tammie [name], female    DOB: 08-Jul-1935, 87 y.o.   MRN: [id number]  Chief Complaint  Patient presents with   Leg Swelling    Leg Pain  The incident occurred more than 1 week ago. There was no injury mechanism. The pain is present in the left leg. The quality of the pain is described as aching. The pain is mild. She has tried elevation for the symptoms.   Blood clots run in her family (mom, brother, cousins) and she is worried this might be the case.  No personal hx of clotting.   No CP, dizziness, SOB, redness of lower leg. No fevers.  No recent surgeries or travel.   Past Medical History:  Diagnosis Date   Abnormal uterine bleeding    Allergic rhinitis    Allergy    seasonal   Anxiety    panic attacks   Anxiety disorder    Asthma    Cataract    left eye surgery to removed   Cataract    right eye   Colorectal cancer ([place]) 1997   T3, N0   Depression    pt states she isn't depressed anymore, continues paxil daily   Endometriosis    Fibroid    GERD (gastroesophageal reflux disease)    Hemorrhoids    Hyperlipidemia    diet controlled, no meds   Hypertension    PONV (postoperative nausea and vomiting)    Prediabetes    Seasonal allergies    SVD (spontaneous vaginal delivery)    x 3   Thyroid disease    Urinary incontinence     Past Surgical History:  Procedure Laterality Date   cataract surgery Left    [place] N/A 11/21/2015   Procedure: [place];  Surgeon: Salvadore Dom, MD;  Location: [facility];  Service: Gynecology;  Laterality: N/A;   DILATION AND CURETTAGE OF UTERUS     D & C   POLYPECTOMY     TONSILLECTOMY      Family History  Problem Relation Age of Onset   Sudden death Brother 64       Apparent DVT/PE. all males   Heart attack Brother    Varicose  Veins Mother    Hypertension Father    Sudden death Cousin 67       Paternal cousin -dvt/pe 7 all males   Coronary artery disease Cousin 3       Paternal cousin, MI   Breast cancer Daughter 51   Stroke Maternal Grandmother    Stroke Maternal Grandfather    Early death Paternal Grandmother    Tuberculosis Paternal Grandmother    Early death Paternal Grandfather    Tuberculosis Paternal Grandfather    Hypertension Son    Colon cancer Neg Hx    Rectal cancer Neg Hx    Stomach cancer Neg Hx     Social History   Tobacco Use   Smoking status: Never   Smokeless tobacco: Never  Vaping Use   Vaping Use: Never used  Substance Use Topics   Alcohol use: Yes    Alcohol/week: 0.0 standard drinks    Comment: occasional wine   Drug use: No     Allergies  Allergen Reactions   Erythromycin     unknown   Fentanyl Hives  Heparin     questionable, unknown   Hydrocodone-Acetaminophen Hives   Meperidine Hcl     unknown   Penicillins     Has patient had a PCN reaction causing immediate rash, facial/tongue/throat swelling, SOB or lightheadedness with hypotension: no Has patient had a PCN reaction causing severe rash involving mucus membranes or skin necrosis: no Has patient had a PCN reaction that required hospitalization no Has patient had a PCN reaction occurring within the last 10 years: no If all of the above answers are "NO", then may proceed with Cephalosporin use.    Procaine Hcl     Could not sleep   Promethazine Hcl     unknown   Temazepam Hives    unknown    Review of Systems NEGATIVE UNLESS OTHERWISE INDICATED IN HPI      Objective:     BP 136/65    Pulse 64    Temp 98.2 F (36.8 C)    Ht 4' (1.219 m)    Wt 175 lb 3.2 oz (79.5 kg)    SpO2 96%    BMI 53.46 kg/m   Wt Readings from Last 3 Encounters:  10/26/21 175 lb 3.2 oz (79.5 kg)  04/16/21 172 lb 9.6 oz (78.3 kg)  11/07/20 173 lb 6.4 oz (78.7 kg)    BP Readings from Last 3 Encounters:  10/26/21 136/65   05/30/21 (!) 173/72  04/16/21 (!) 154/70     Physical Exam Vitals and nursing note reviewed.  Constitutional:      Appearance: Normal appearance. She is obese. She is not toxic-appearing.  HENT:     Head: Normocephalic and atraumatic.     Right Ear: External ear normal.     Left Ear: External ear normal.     Nose: Nose normal.     Mouth/Throat:     Mouth: Mucous membranes are moist.  Eyes:     Extraocular Movements: Extraocular movements intact.     Conjunctiva/sclera: Conjunctivae normal.     Pupils: Pupils are equal, round, and reactive to light.  Cardiovascular:     Rate and Rhythm: Normal rate and regular rhythm.     Pulses: Normal pulses.     Heart sounds: Normal heart sounds.     Comments: Homan's sign negative bilateral lower legs. Relatively equal swelling in both legs, but noticeable anterior "indentation" of LLE not in RLE.  Left lower leg some TTP along calf and slightly increased erythema compared to RLE.  Pulmonary:     Effort: Pulmonary effort is normal.     Breath sounds: Normal breath sounds.  Abdominal:     Palpations: Abdomen is soft.  Musculoskeletal:        General: Normal range of motion.     Cervical back: Normal range of motion and neck supple.     Right lower leg: 1+ Pitting Edema present.     Left lower leg: 1+ Pitting Edema present.  Skin:    General: Skin is warm and dry.  Neurological:     General: No focal deficit present.     Mental Status: She is alert and oriented to person, place, and time.  Psychiatric:        Mood and Affect: Mood normal.        Behavior: Behavior normal.        Thought Content: Thought content normal.        Judgment: Judgment normal.       Assessment & Plan:   Problem List Items  Addressed This Visit   None Visit Diagnoses     Pain and swelling of left lower leg    -  Primary   Relevant Orders   VAS [place] LOWER EXTREMITY VENOUS (DVT)   Family history of blood clots       Relevant Orders   VAS [place] LOWER  EXTREMITY VENOUS (DVT)       1. Pain and swelling of left lower leg 2. Family history of blood clots -Pt most concerned about possible DVT, will try to obtain venous US today; if positive, of course will need to be started on anticoagulation  -She does not have any red flag symptoms today, no need for ER right now. -Encouraged elevated above heart level, drinking plenty of water, and limiting salt to help with regular edema in legs -She may want to consider coag panel in the future, can discuss with PCP   Tiphanie Vo M Yeraldi Fidler, PA-C

## 2021-10-26 NOTE — Addendum Note (Signed)
Addended by: Cranston Neighbor on: 10/26/2021 01:27 PM   Modules accepted: Orders

## 2021-10-26 NOTE — Discharge Instructions (Signed)
Try compression stockings and elevation of your feet when you are sleeping.  Follow-up with your doctor this week.  Turn to the ER if your symptoms worsen if you have chest pain or any additional concerns.

## 2021-10-26 NOTE — ED Provider Notes (Signed)
[facility] EMERGENCY DEPT Provider Note   CSN: [id number] Arrival date & time: 10/26/21  1550     History  Chief Complaint  Patient presents with   Leg Swelling    Tammie [name] is a 87 y.o. female.  Patient presents with chief complaint of swelling to the left calf.  She noticed a bump in the left calf about 3 days ago she put some ice on it and the swelling is gotten better but still moderately swollen per patient.  She spoke with her primary care doctor and had an ultrasound set up for Monday, however she grew nervous that she has multiple family members had blood clots that she presents to the ER.  Denies chest pain or shortness of breath denies fevers or cough or vomiting or diarrhea.  She states her left calf swelling is improved over the last 3 days.      Home Medications Prior to Admission medications   Medication Sig Start Date End Date Taking? Authorizing Provider  albuterol (VENTOLIN HFA) 108 (90 Base) MCG/ACT inhaler Inhale 2 puffs into the lungs every 6 hours as needed for wheezing or shortness of breath. 04/15/19   Marin Olp, MD  amLODipine (NORVASC) 10 MG tablet TAKE 1 TABLET BY MOUTH EVERY DAY 01/29/21   Marin Olp, MD  augmented betamethasone dipropionate (DIPROLENE-AF) 0.05 % cream  07/02/19   [provider]  budesonide-formoterol (SYMBICORT) 160-4.5 MCG/ACT inhaler INHALE 2 PUFFS into lungs 2 TIMES DAILY 10/04/21   Marin Olp, MD  Chlorpheniramine-DM (CORICIDIN HBP COUGH/COLD PO) Take by mouth.    [provider]  clobetasol (TEMOVATE) 0.05 % external solution Apply 1 application topically 2 (two) times daily. For 7-10 days up to twice a day maximum. Use only on scalp 12/20/16   Marin Olp, MD  hydrocortisone (ANUSOL-HC) 2.5 % rectal cream Place 1 application rectally 2 times daily as needed for hemorrhoids. 06/26/17   Marin Olp, MD  lisinopril-hydrochlorothiazide (ZESTORETIC) 20-25 MG tablet TAKE 1  TABLET BY MOUTH EVERY DAY 09/27/21   Marin Olp, MD  meclizine (ANTIVERT) 25 MG tablet TAKE 1 TABLET BY MOUTH 2 TIMES DAILY AS NEEDED FOR vertigo OR dizziness. 02/23/18   Marin Olp, MD  omeprazole (PRILOSEC) 20 MG capsule TAKE 1 CAPSULE BY MOUTH EVERY DAY 10/17/21   Marin Olp, MD  PARoxetine (PAXIL) 20 MG tablet TAKE 1 TABLET BY MOUTH EVERY DAY 09/18/21   Marin Olp, MD      Allergies    Erythromycin, Fentanyl, Heparin, Hydrocodone-acetaminophen, Meperidine hcl, Penicillins, Procaine hcl, Promethazine hcl, and Temazepam    Review of Systems   Review of Systems  Constitutional:  Negative for fever.  HENT:  Negative for ear pain.   Eyes:  Negative for pain.  Respiratory:  Negative for cough.   Cardiovascular:  Negative for chest pain.  Gastrointestinal:  Negative for abdominal pain.  Genitourinary:  Negative for flank pain.  Musculoskeletal:  Negative for back pain.  Skin:  Negative for rash.  Neurological:  Negative for headaches.   Physical Exam Updated Vital Signs BP (!) 182/71    Pulse 71    Temp 98.4 F (36.9 C) (Oral)    Resp 16    Ht 5\' 4"  (1.626 m)    Wt 79.8 kg    SpO2 95%    BMI 30.21 kg/m  Physical Exam Constitutional:      General: She is not in acute distress.    Appearance:  Normal appearance.  HENT:     Head: Normocephalic.     Nose: Nose normal.  Eyes:     Extraocular Movements: Extraocular movements intact.  Cardiovascular:     Rate and Rhythm: Normal rate.  Pulmonary:     Effort: Pulmonary effort is normal.  Musculoskeletal:        General: Normal range of motion.     Cervical back: Normal range of motion.     Comments: Mild edema left lower extremity.  No abnormal warmth no cellulitis no tenderness.  Neurovascularly intact.  Neurological:     General: No focal deficit present.     Mental Status: She is alert. Mental status is at baseline.    ED Results / Procedures / Treatments   Labs (all labs ordered are listed, but only  abnormal results are displayed) Labs Reviewed - No data to display  EKG None  Radiology US Venous Img Lower  Left (DVT Study)  Result Date: 10/26/2021 CLINICAL DATA:  Pain and swelling EXAM: Left LOWER EXTREMITY VENOUS DOPPLER ULTRASOUND TECHNIQUE: Gray-scale sonography with compression, as well as color and duplex ultrasound, were performed to evaluate the deep venous system(s) from the level of the common femoral vein through the popliteal and proximal calf veins. COMPARISON:  None. FINDINGS: VENOUS Normal compressibility of the common femoral, superficial femoral, and popliteal veins, as well as the visualized calf veins. Visualized portions of profunda femoral vein and great saphenous vein unremarkable. No filling defects to suggest DVT on grayscale or color Doppler imaging. Doppler waveforms show normal direction of venous flow, normal respiratory plasticity and response to augmentation. Limited views of the contralateral common femoral vein are unremarkable. OTHER There is edema in the subcutaneous plane, especially in the calf. Limitations: none IMPRESSION: There is no evidence of deep venous thrombosis in the left lower extremity. Electronically Signed   By: Elmer Picker M.D.   On: 10/26/2021 17:42    Procedures Procedures    Medications Ordered in ED Medications - No data to display  ED Course/ Medical Decision Making/ A&P                           Medical Decision Making Amount and/or Complexity of Data Reviewed ECG/medicine tests: ordered.   Review of records shows an office visit with podiatry July 23, 2021.  Ultrasound shows no evidence of DVT.  Patient otherwise hemodynamically stable, advised continued outpatient follow-up with Dr. This week.  Advising immediate return for worsening symptoms trouble breathing fevers or any additional concerns.        Final Clinical Impression(s) / ED Diagnoses Final diagnoses:  Edema, unspecified type    Rx / DC  Orders ED Discharge Orders     None         Luna Fuse, MD 10/26/21 [phone]

## 2021-10-26 NOTE — Telephone Encounter (Signed)
I received a message about this patient needing a STAT US r/o DVT around 12:52pm 10/26/21  After review of the chart, there are different ways this has to be ordered for different locations.  I asked the office to change the order to US Venous Lower Left  DVT  ([id number]) as these are easier and quicker to get scheduled. Upon calling scheduling they were unable to get the patient in anywhere due to being booked up at [facility] so we went back to the original Vascular order at [facility] and I was not able to get anyone on the phone to schedule the patient. I called over 10 times and got no answer with the Vascular lab with [facility].   I spoke with Alyssa Allwardt, PA and she is aware of the difficulty I am having getting her scheduled. She stated that the patient was home now and she was going to treat preventatively over the weekend with blood thinners and we would try to get her scheduled next week sometime.   I have made Tomasa Hosteller the OM aware of this as well since her Referral Coordinator is out today.   It is now 1:56pm 10/26/21, I am sending this note to Alyssa as FYI and for documentation purposes to ensure this is followed up on next week.

## 2021-10-26 NOTE — Telephone Encounter (Signed)
Patient notified,she will be going to ER to get checked out today

## 2021-10-26 NOTE — ED Triage Notes (Signed)
Pt worried about possible blood clot in left lower leg. Pt saw PCP and has doppler set up for Monday. States she noticed a lump on her left leg a week ago. Denies any SOB or CP.

## 2021-11-08 ENCOUNTER — Ambulatory Visit (INDEPENDENT_AMBULATORY_CARE_PROVIDER_SITE_OTHER): Payer: PPO

## 2021-11-08 ENCOUNTER — Other Ambulatory Visit: Payer: Self-pay

## 2021-11-08 ENCOUNTER — Ambulatory Visit: Payer: PPO

## 2021-11-08 DIAGNOSIS — Z0000 Encounter for general adult medical examination without abnormal findings: Secondary | ICD-10-CM

## 2021-11-08 NOTE — Progress Notes (Signed)
Virtual Visit via Telephone Note  I connected with  Tammie [name] on 11/08/21 at  8:00 AM EST by telephone and verified that I am speaking with the correct person using two identifiers.  Medicare Annual Wellness visit completed telephonically due to Covid-19 pandemic.   Persons participating in this call: This Health Coach and this patient.   Location: Patient: Home Provider: Office    I discussed the limitations, risks, security and privacy concerns of performing an evaluation and management service by telephone and the availability of in person appointments. The patient expressed understanding and agreed to proceed.  Unable to perform video visit due to video visit attempted and failed and/or patient does not have video capability.   Some vital signs may be absent or patient reported.   Willette Brace, LPN   Subjective:   Tammie [name] is a 87 y.o. female who presents for Medicare Annual (Subsequent) preventive examination.  Review of Systems     Cardiac Risk Factors include: advanced age (>94men, >82 women);hypertension;dyslipidemia;obesity (BMI >30kg/m2)     Objective:    There were no vitals filed for this visit. There is no height or weight on file to calculate BMI.  Advanced Directives 11/08/2021 11/02/2020 07/27/2019 06/10/2018 11/21/2015 11/20/2015 08/04/2014  Does Patient Have a Medical Advance Directive? Yes Yes Yes Yes Yes Yes Yes  Type of Academic librarian Living will [place];Living will [place];Living will - [place];Living will [place];Living will  Does patient want to make changes to medical advance directive? - - No - Patient declined No - Patient declined - - -  Copy of [place] in Chart? No - copy requested - No - copy requested No - copy requested - No - copy requested -    Current Medications (verified) Outpatient Encounter  Medications as of 11/08/2021  Medication Sig   albuterol (VENTOLIN HFA) 108 (90 Base) MCG/ACT inhaler Inhale 2 puffs into the lungs every 6 hours as needed for wheezing or shortness of breath.   amLODipine (NORVASC) 10 MG tablet TAKE 1 TABLET BY MOUTH EVERY DAY   budesonide-formoterol (SYMBICORT) 160-4.5 MCG/ACT inhaler INHALE 2 PUFFS into lungs 2 TIMES DAILY   Chlorpheniramine-DM (CORICIDIN HBP COUGH/COLD PO) Take by mouth.   clobetasol (TEMOVATE) 0.05 % external solution Apply 1 application topically 2 (two) times daily. For 7-10 days up to twice a day maximum. Use only on scalp   hydrocortisone (ANUSOL-HC) 2.5 % rectal cream Place 1 application rectally 2 times daily as needed for hemorrhoids.   lisinopril-hydrochlorothiazide (ZESTORETIC) 20-25 MG tablet TAKE 1 TABLET BY MOUTH EVERY DAY   meclizine (ANTIVERT) 25 MG tablet TAKE 1 TABLET BY MOUTH 2 TIMES DAILY AS NEEDED FOR vertigo OR dizziness.   omeprazole (PRILOSEC) 20 MG capsule TAKE 1 CAPSULE BY MOUTH EVERY DAY   PARoxetine (PAXIL) 20 MG tablet TAKE 1 TABLET BY MOUTH EVERY DAY   augmented betamethasone dipropionate (DIPROLENE-AF) 0.05 % cream  (Patient not taking: Reported on 11/08/2021)   No facility-administered encounter medications on file as of 11/08/2021.    Allergies (verified) Erythromycin, Fentanyl, Heparin, Hydrocodone-acetaminophen, Meperidine hcl, Penicillins, Procaine hcl, Promethazine hcl, and Temazepam   History: Past Medical History:  Diagnosis Date   Abnormal uterine bleeding    Allergic rhinitis    Allergy    seasonal   Anxiety    panic attacks   Anxiety disorder    Asthma    Cataract  left eye surgery to removed   Cataract    right eye   Colorectal cancer ([place]) 1997   T3, N0   Depression    pt states she isn't depressed anymore, continues paxil daily   Endometriosis    Fibroid    GERD (gastroesophageal reflux disease)    Hemorrhoids    Hyperlipidemia    diet controlled, no meds   Hypertension     PONV (postoperative nausea and vomiting)    Prediabetes    Seasonal allergies    SVD (spontaneous vaginal delivery)    x 3   Thyroid disease    Urinary incontinence    Past Surgical History:  Procedure Laterality Date   cataract surgery Left    [place] N/A 11/21/2015   Procedure: [place];  Surgeon: Salvadore Dom, MD;  Location: [facility];  Service: Gynecology;  Laterality: N/A;   DILATION AND CURETTAGE OF UTERUS     D & C   POLYPECTOMY     TONSILLECTOMY     Family History  Problem Relation Age of Onset   Sudden death Brother 62       Apparent DVT/PE. all males   Heart attack Brother    Varicose Veins Mother    Hypertension Father    Sudden death Cousin 72       Paternal cousin -dvt/pe 7 all males   Coronary artery disease Cousin 10       Paternal cousin, MI   Breast cancer Daughter 60   Stroke Maternal Grandmother    Stroke Maternal Grandfather    Early death Paternal Grandmother    Tuberculosis Paternal Grandmother    Early death Paternal Grandfather    Tuberculosis Paternal Grandfather    Hypertension Son    Colon cancer Neg Hx    Rectal cancer Neg Hx    Stomach cancer Neg Hx    Social History   Socioeconomic History   Marital status: Divorced    Spouse name: Not on file   Number of children: Not on file   Years of education: Not on file   Highest education level: Not on file  Occupational History   Occupation: part time  Tobacco Use   Smoking status: Never   Smokeless tobacco: Never  Vaping Use   Vaping Use: Never used  Substance and Sexual Activity   Alcohol use: Yes    Alcohol/week: 0.0 standard drinks    Comment: occasional wine   Drug use: No   Sexual activity: Not Currently    Partners: Male    Birth control/protection: Post-menopausal  Other Topics Concern   Not on file  Social  History Narrative   Divorced. Lives alone.  Two children. 2 granddaughters (66 and 41 years old in 2016)   Family of 7 including ex-husband      Shows houses (Artist- Radiographer, therapeutic and rental properties)- shows rentals      Hobbies: painting- enjoys classes, writing, photography, sewing   Social Determinants of Radio broadcast assistant Strain: Low Risk    Difficulty of Paying Living Expenses: Not hard at all  Food Insecurity: No Food Insecurity   Worried About Charity fundraiser in the Last Year: Never true   Arboriculturist in the Last Year: Never true  Transportation Needs: No Transportation Needs  Lack of Transportation (Medical): No   Lack of Transportation (Non-Medical): No  Physical Activity: Inactive   Days of Exercise per Week: 0 days   Minutes of Exercise per Session: 0 min  Stress: No Stress Concern Present   Feeling of Stress : Only a little  Social Connections: Moderately Isolated   Frequency of Communication with Friends and Family: More than three times a week   Frequency of Social Gatherings with Friends and Family: More than three times a week   Attends Religious Services: More than 4 times per year   Active Member of [employer] or Organizations: No   Attends Music therapist: Never   Marital Status: Divorced    Tobacco Counseling Counseling given: Not Answered   Clinical Intake:  Pre-visit preparation completed: Yes  Pain : No/denies pain     BMI - recorded: 52.67 Nutritional Status: BMI > 30  Obese Nutritional Risks: None Diabetes: No  How often do you need to have someone help you when you read instructions, pamphlets, or other written materials from your doctor or pharmacy?: 1 - Never  Diabetic?No  Interpreter Needed?: No  Information entered by :: Charlott Rakes, LPN   Activities of Daily Living In your present state of health, do you have any difficulty performing the following activities: 11/08/2021  Hearing? N   Vision? N  Difficulty concentrating or making decisions? N  Walking or climbing stairs? N  Dressing or bathing? N  Doing errands, shopping? N  Preparing Food and eating ? N  Using the Toilet? N  In the past six months, have you accidently leaked urine? Y  Comment wears a pad  Do you have problems with loss of bowel control? N  Managing your Medications? N  Managing your Finances? N  Housekeeping or managing your Housekeeping? N  Some recent data might be hidden    Patient Care Team: Marin Olp, MD as PCP - General (Family Medicine) Salvadore Dom, MD as Consulting Physician (Obstetrics and Gynecology) Landis Martins, DPM as Consulting Physician (Podiatry) Burden, Lincoln Brigham, MD as Consulting Physician (Ophthalmology) Druscilla Brownie, MD as Consulting Physician (Dermatology)  Indicate any recent Medical Services you may have received from other than [facility] providers in the past year (date may be approximate).     Assessment:   This is a routine wellness examination for Tammie [name].  Hearing/Vision screen Hearing Screening - Comments:: Pt denies any hearing issues  Vision Screening - Comments:: Pt follows up with Dr Joya San for annual eye exams   Dietary issues and exercise activities discussed: Current Exercise Habits: The patient does not participate in regular exercise at present   Goals Addressed             This Visit's Progress    Patient Stated       Start walking more        Depression Screen PHQ 2/9 Scores 11/08/2021 04/16/2021 11/02/2020 08/15/2020 07/27/2019 01/25/2019 06/10/2018  PHQ - 2 Score 0 0 0 0 0 0 0  PHQ- 9 Score - - - - - 3 -    Fall Risk Fall Risk  11/08/2021 04/16/2021 11/07/2020 11/02/2020 08/11/2019  Falls in the past year? 0 0 0 0 0  Comment - - - - Emmi Telephone Survey: data to providers prior to load  Number falls in past yr: 0 - 0 0 -  Injury with Fall? 0 - 0 0 -  Risk for fall due to : Impaired vision - - Impaired vision;Impaired  balance/gait -  Follow up Falls prevention discussed - - Falls prevention discussed -    FALL RISK PREVENTION PERTAINING TO THE HOME:  Any stairs in or around the home? No  If so, are there any without handrails? No  Home free of loose throw rugs in walkways, pet beds, electrical cords, etc? Yes  Adequate lighting in your home to reduce risk of falls? Yes   ASSISTIVE DEVICES UTILIZED TO PREVENT FALLS:  Life alert? No  Use of a cane, walker or w/c? No  Grab bars in the bathroom? Yes  Shower chair or bench in shower? No  Elevated toilet seat or a handicapped toilet? No   TIMED UP AND GO:  Was the test performed? No .   Cognitive Function:     6CIT Screen 11/08/2021 11/02/2020 07/27/2019  What Year? 0 points 0 points 0 points  What month? 0 points 0 points 0 points  What time? 0 points - 0 points  Count back from 20 0 points 0 points 0 points  Months in reverse 0 points 0 points 0 points  Repeat phrase 0 points 0 points 0 points  Total Score 0 - 0    Immunizations Immunization History  Administered Date(s) Administered   Pneumococcal Conjugate-13 08/09/2014   Pneumococcal Polysaccharide-23 10/19/2015    TDAP status: Due, Education has been provided regarding the importance of this vaccine. Advised may receive this vaccine at local pharmacy or Health Dept. Aware to provide a copy of the vaccination record if obtained from local pharmacy or Health Dept. Verbalized acceptance and understanding.  Flu Vaccine status: Declined, Education has been provided regarding the importance of this vaccine but patient still declined. Advised may receive this vaccine at local pharmacy or Health Dept. Aware to provide a copy of the vaccination record if obtained from local pharmacy or Health Dept. Verbalized acceptance and understanding.  Pneumococcal vaccine status: Up to date  Covid-19 vaccine status: Declined, Education has been provided regarding the importance of this vaccine but  patient still declined. Advised may receive this vaccine at local pharmacy or Health Dept.or vaccine clinic. Aware to provide a copy of the vaccination record if obtained from local pharmacy or Health Dept. Verbalized acceptance and understanding.  Qualifies for Shingles Vaccine? Yes   Zostavax completed No   Shingrix Completed?: No.    Education has been provided regarding the importance of this vaccine. Patient has been advised to call insurance company to determine out of pocket expense if they have not yet received this vaccine. Advised may also receive vaccine at local pharmacy or Health Dept. Verbalized acceptance and understanding.  Screening Tests Health Maintenance  Topic Date Due   COVID-19 Vaccine (1) Never done   Zoster Vaccines- Shingrix (1 of 2) Never done   TETANUS/TDAP  08/25/2029 (Originally 09/08/1954)   INFLUENZA VACCINE  08/07/2035 (Originally 04/16/2021)   Pneumonia Vaccine 33+ Years old  Completed   DEXA SCAN  Completed   HPV VACCINES  Aged Out    Health Maintenance  Health Maintenance Due  Topic Date Due   COVID-19 Vaccine (1) Never done   Zoster Vaccines- Shingrix (1 of 2) Never done    Colorectal cancer screening: No longer required.   Mammogram status: No longer required due to age.  Bone Density status: Completed 09/27/15. Results reflect: Bone density results: OSTEOPENIA. Repeat every 2 years.   Additional Screening:   Vision Screening: Recommended annual ophthalmology exams for early detection of glaucoma and other disorders of the eye. Is  the patient up to date with their annual eye exam?  Yes  Who is the provider or what is the name of the office in which the patient attends annual eye exams? Dr Joya San  If pt is not established with a provider, would they like to be referred to a provider to establish care? No .   Dental Screening: Recommended annual dental exams for proper oral hygiene  Community Resource Referral / Chronic Care  Management: CRR required this visit?  No   CCM required this visit?  No      Plan:     I have personally reviewed and noted the following in the patients chart:   Medical and social history Use of alcohol, tobacco or illicit drugs  Current medications and supplements including opioid prescriptions.  Functional ability and status Nutritional status Physical activity Advanced directives List of other physicians Hospitalizations, surgeries, and ER visits in previous 12 months Vitals Screenings to include cognitive, depression, and falls Referrals and appointments  In addition, I have reviewed and discussed with patient certain preventive protocols, quality metrics, and best practice recommendations. A written personalized care plan for preventive services as well as general preventive health recommendations were provided to patient.     Willette Brace, LPN   2/83/1517   Nurse Notes: None at this time

## 2021-11-08 NOTE — Patient Instructions (Signed)
Tammie [name] , Thank you for taking time to come for your Medicare Wellness Visit. I appreciate your ongoing commitment to your health goals. Please review the following plan we discussed and let me know if I can assist you in the future.   Screening recommendations/referrals: Colonoscopy: No Longer required  Mammogram: No longer required  Bone Density: Done 09/27/15 repeat every 2 years  Recommended yearly ophthalmology/optometry visit for glaucoma screening and checkup Recommended yearly dental visit for hygiene and checkup  Vaccinations: Influenza vaccine: declined and discussed  Pneumococcal vaccine: Up to date Tdap vaccine: Due and discussed  Shingles vaccine: Shingrix discussed. Please contact your pharmacy for coverage information.   Covid-19:Declined and discussed   Advanced directives: Please bring a copy of your health care power of attorney and living will to the office at your convenience.  Conditions/risks identified: Start to walk more   Next appointment: Follow up in one year for your annual wellness visit    Preventive Care 65 Years and Older, Female Preventive care refers to lifestyle choices and visits with your health care provider that can promote health and wellness. What does preventive care include? A yearly physical exam. This is also called an annual well check. Dental exams once or twice a year. Routine eye exams. Ask your health care provider how often you should have your eyes checked. Personal lifestyle choices, including: Daily care of your teeth and gums. Regular physical activity. Eating a healthy diet. Avoiding tobacco and drug use. Limiting alcohol use. Practicing safe sex. Taking low-dose aspirin every day. Taking vitamin and mineral supplements as recommended by your health care provider. What happens during an annual well check? The services and screenings done by your health care provider during your annual well check will depend on your  age, overall health, lifestyle risk factors, and family history of disease. Counseling  Your health care provider may ask you questions about your: Alcohol use. Tobacco use. Drug use. Emotional well-being. Home and relationship well-being. Sexual activity. Eating habits. History of falls. Memory and ability to understand (cognition). Work and work Statistician. Reproductive health. Screening  You may have the following tests or measurements: Height, weight, and BMI. Blood pressure. Lipid and cholesterol levels. These may be checked every 5 years, or more frequently if you are over 50 years old. Skin check. Lung cancer screening. You may have this screening every year starting at age 85 if you have a 30-pack-year history of smoking and currently smoke or have quit within the past 15 years. Fecal occult blood test (FOBT) of the stool. You may have this test every year starting at age 85. Flexible sigmoidoscopy or colonoscopy. You may have a sigmoidoscopy every 5 years or a colonoscopy every 10 years starting at age 85. Hepatitis C blood test. Hepatitis B blood test. Sexually transmitted disease (STD) testing. Diabetes screening. This is done by checking your blood sugar (glucose) after you have not eaten for a while (fasting). You may have this done every 1-3 years. Bone density scan. This is done to screen for osteoporosis. You may have this done starting at age 85. Mammogram. This may be done every 1-2 years. Talk to your health care provider about how often you should have regular mammograms. Talk with your health care provider about your test results, treatment options, and if necessary, the need for more tests. Vaccines  Your health care provider may recommend certain vaccines, such as: Influenza vaccine. This is recommended every year. Tetanus, diphtheria, and acellular pertussis (Tdap, Td)  vaccine. You may need a Td booster every 10 years. Zoster vaccine. You may need this after  age 85. Pneumococcal 13-valent conjugate (PCV13) vaccine. One dose is recommended after age 85. Pneumococcal polysaccharide (PPSV23) vaccine. One dose is recommended after age 85. Talk to your health care provider about which screenings and vaccines you need and how often you need them. This information is not intended to replace advice given to you by your health care provider. Make sure you discuss any questions you have with your health care provider. Document Released: 09/29/2015 Document Revised: 05/22/2016 Document Reviewed: 07/04/2015 Elsevier Interactive Patient Education  2017 [place] Prevention in the Home Falls can cause injuries. They can happen to people of all ages. There are many things you can do to make your home safe and to help prevent falls. What can I do on the outside of my home? Regularly fix the edges of walkways and driveways and fix any cracks. Remove anything that might make you trip as you walk through a door, such as a raised step or threshold. Trim any bushes or trees on the path to your home. Use bright outdoor lighting. Clear any walking paths of anything that might make someone trip, such as rocks or tools. Regularly check to see if handrails are loose or broken. Make sure that both sides of any steps have handrails. Any raised decks and porches should have guardrails on the edges. Have any leaves, snow, or ice cleared regularly. Use sand or salt on walking paths during winter. Clean up any spills in your garage right away. This includes oil or grease spills. What can I do in the bathroom? Use night lights. Install grab bars by the toilet and in the tub and shower. Do not use towel bars as grab bars. Use non-skid mats or decals in the tub or shower. If you need to sit down in the shower, use a plastic, non-slip stool. Keep the floor dry. Clean up any water that spills on the floor as soon as it happens. Remove soap buildup in the tub or shower  regularly. Attach bath mats securely with double-sided non-slip rug tape. Do not have throw rugs and other things on the floor that can make you trip. What can I do in the bedroom? Use night lights. Make sure that you have a light by your bed that is easy to reach. Do not use any sheets or blankets that are too big for your bed. They should not hang down onto the floor. Have a firm chair that has side arms. You can use this for support while you get dressed. Do not have throw rugs and other things on the floor that can make you trip. What can I do in the kitchen? Clean up any spills right away. Avoid walking on wet floors. Keep items that you use a lot in easy-to-reach places. If you need to reach something above you, use a strong step stool that has a grab bar. Keep electrical cords out of the way. Do not use floor polish or wax that makes floors slippery. If you must use wax, use non-skid floor wax. Do not have throw rugs and other things on the floor that can make you trip. What can I do with my stairs? Do not leave any items on the stairs. Make sure that there are handrails on both sides of the stairs and use them. Fix handrails that are broken or loose. Make sure that handrails are as long  as the stairways. Check any carpeting to make sure that it is firmly attached to the stairs. Fix any carpet that is loose or worn. Avoid having throw rugs at the top or bottom of the stairs. If you do have throw rugs, attach them to the floor with carpet tape. Make sure that you have a light switch at the top of the stairs and the bottom of the stairs. If you do not have them, ask someone to add them for you. What else can I do to help prevent falls? Wear shoes that: Do not have high heels. Have rubber bottoms. Are comfortable and fit you well. Are closed at the toe. Do not wear sandals. If you use a stepladder: Make sure that it is fully opened. Do not climb a closed stepladder. Make sure that  both sides of the stepladder are locked into place. Ask someone to hold it for you, if possible. Clearly mark and make sure that you can see: Any grab bars or handrails. First and last steps. Where the edge of each step is. Use tools that help you move around (mobility aids) if they are needed. These include: Canes. Walkers. Scooters. Crutches. Turn on the lights when you go into a dark area. Replace any light bulbs as soon as they burn out. Set up your furniture so you have a clear path. Avoid moving your furniture around. If any of your floors are uneven, fix them. If there are any pets around you, be aware of where they are. Review your medicines with your doctor. Some medicines can make you feel dizzy. This can increase your chance of falling. Ask your doctor what other things that you can do to help prevent falls. This information is not intended to replace advice given to you by your health care provider. Make sure you discuss any questions you have with your health care provider. Document Released: 06/29/2009 Document Revised: 02/08/2016 Document Reviewed: 10/07/2014 Elsevier Interactive Patient Education  2017 [employer].

## 2021-11-12 NOTE — Progress Notes (Signed)
? ?Phone [phone] ?In person visit ?  ?Subjective:  ? ?Tammie [name] is a 87 y.o. year old very pleasant female patient who presents for/with See problem oriented charting ?Chief Complaint  ?Patient presents with  ? Follow-up  ?  Pt is here to f/u on edema and she states that is doing better. She is trying to find support stokings b/c she cant wear compression socks.  ? ? ?This visit occurred during the SARS-CoV-2 public health emergency.  Safety protocols were in place, including screening questions prior to the visit, additional usage of staff PPE, and extensive cleaning of exam room while observing appropriate contact time as indicated for disinfecting solutions.  ? ?Past Medical History-  ?Patient Active Problem List  ? Diagnosis Date Noted  ? Prediabetes   ?  Priority: High  ? Hyperglycemia 07/15/2017  ?  Priority: High  ? Osteopenia 10/02/2015  ?  Priority: High  ? Venous insufficiency 11/15/2021  ?  Priority: Medium   ? Hyperlipidemia 01/15/2018  ?  Priority: Medium   ? Vertigo 07/15/2017  ?  Priority: Medium   ? Hypertension   ?  Priority: Medium   ? Panic attacks   ?  Priority: Medium   ? History of colorectal cancer   ?  Priority: Medium   ? Asthma   ?  Priority: Medium   ? Left leg pain 12/20/2016  ?  Priority: Low  ? Hemorrhoids 12/20/2016  ?  Priority: Low  ? Dermatitis 12/20/2016  ?  Priority: Low  ? Systolic murmur 78/29/5621  ?  Priority: Low  ? Allergic rhinitis   ?  Priority: Low  ? GERD 06/22/2009  ?  Priority: Low  ? ? ?Medications- reviewed and updated ?Current Outpatient Medications  ?Medication Sig Dispense Refill  ? albuterol (VENTOLIN HFA) 108 (90 Base) MCG/ACT inhaler Inhale 2 puffs into the lungs every 6 hours as needed for wheezing or shortness of breath. 8.5 g 2  ? amLODipine (NORVASC) 10 MG tablet TAKE 1 TABLET BY MOUTH EVERY DAY 90 tablet 3  ? augmented betamethasone dipropionate (DIPROLENE-AF) 0.05 % cream     ? budesonide-formoterol (SYMBICORT) 160-4.5 MCG/ACT inhaler INHALE 2  PUFFS into lungs 2 TIMES DAILY 10.2 g 5  ? Chlorpheniramine-DM (CORICIDIN HBP COUGH/COLD PO) Take by mouth.    ? clobetasol (TEMOVATE) 0.05 % external solution Apply 1 application topically 2 (two) times daily. For 7-10 days up to twice a day maximum. Use only on scalp 50 mL 0  ? hydrocortisone (ANUSOL-HC) 2.5 % rectal cream Place 1 application rectally 2 times daily as needed for hemorrhoids. 28.35 g 1  ? lisinopril-hydrochlorothiazide (ZESTORETIC) 20-25 MG tablet TAKE 1 TABLET BY MOUTH EVERY DAY 90 tablet 1  ? meclizine (ANTIVERT) 25 MG tablet TAKE 1 TABLET BY MOUTH 2 TIMES DAILY AS NEEDED FOR vertigo OR dizziness. 30 tablet 0  ? omeprazole (PRILOSEC) 20 MG capsule TAKE 1 CAPSULE BY MOUTH EVERY DAY 90 capsule 0  ? PARoxetine (PAXIL) 20 MG tablet TAKE 1 TABLET BY MOUTH EVERY DAY 90 tablet 1  ? ?No current facility-administered medications for this visit.  ? ?  ?Objective:  ?BP 130/62   Pulse (!) 58   Temp 97.9 ?F (36.6 ?C)   Ht 5\' 4"  (1.626 m)   Wt 175 lb 9.6 oz (79.7 kg)   SpO2 95%   BMI 30.14 kg/m?  ?Gen: NAD, resting comfortably ?CV: RRR no murmurs rubs or gallops ?Lungs: CTAB no crackles, wheeze, rhonchi ?Abdomen: soft/nontender/nondistended/normal bowel  sounds. No rebound or guarding.  ?Ext: trace edema ?Skin: warm, dry ? ?  ? ?Assessment and Plan  ? ?# ED F/U with edema  ?S:Pt was seen on 10/26/2021 with an evaluation of edema with her left calf. She noticed a bump on the calf 3 days before visit and put ice on it which made the swelling improve however still had moderate swollen. She had a ultrasound set up that following Monday.  ?-Ultrasound showed no evidence of DVT.  Patient otherwise hemodynamically stable. ? ?- patient later realized was having saltine crackers x6 and reviewed just the cracker had 24% of daily value of salt.  ?A/P: I think this is primarily venous insufficiency in combo with her amlodipine- I think using compression stockings is reasonable.  ? ?#Vertigo/dizziness- history of this  and had flare up late last year- she reports has calmed back down to her baseline- some mild dizziness but much better ? ?#hypertension ?S: medication: lisinopril-hctz 20-25 mg, amlodipine 10 mg. Trying to stay well hydrated- feels does pretty well ?- she felt more lightheded on valsartan (had changed with cough and asthma but she didn't tolerate the change) ?BP Readings from Last 3 Encounters:  ?11/15/21 130/62  ?10/26/21 (!) 165/75  ?10/26/21 136/65  ?A/P: well controlled on repeat- continue current meds ? ?# Asthma ?S: Maintenance Medication: Symbicort 2 puffs twice daily ?As needed medication: albuterol. Patient is using this almost never per week.  ?-moving from her apartment onto [place] and thinks will be better for allergies/asthma- no carpet like current apartment ?A/P: Controlled. Continue current medications.  ? ?# Depression ?S: Medication:paxil 20 mg  ?Depression screen [facility] 2/9 11/08/2021 04/16/2021 11/02/2020  ?Decreased Interest 0 0 0  ?Down, Depressed, Hopeless 0 0 0  ?PHQ - 2 Score 0 0 0  ?Altered sleeping - - -  ?Tired, decreased energy - - -  ?Change in appetite - - -  ?Feeling bad or failure about yourself  - - -  ?Trouble concentrating - - -  ?Moving slowly or fidgety/restless - - -  ?Suicidal thoughts - - -  ?PHQ-9 Score - - -  ?A/P: full remission- continue current meds ? ?# Hyperglycemia/insulin resistance/prediabetes ?S:  Medication: none ?Exercise and diet- wants to increase exercise when gets dog- needs to cut down on ice cream ?Lab Results  ?Component Value Date  ? HGBA1C 6.3 10/26/2019  ? HGBA1C 5.8 01/11/2019  ? HGBA1C 6.8 (H) 07/22/2018  ? ? A/P: has been close to diabetes- if has another a1c above 6.5 would diagnose as such- update when she comes back for labs ? ?#hyperlipidemia ?S: Medication:none  ?Lab Results  ?Component Value Date  ? CHOL 177 10/26/2019  ? HDL 31.20 (L) 10/26/2019  ? LDLCALC 105 (H) 07/22/2018  ? LDLDIRECT 115.0 10/26/2019  ? TRIG 229.0 (H) 10/26/2019  ? CHOLHDL 6  10/26/2019  ? A/P: usually we do not start medicine over age 36 except for heart attack/stroke/other indication- continue without meds- try to focus on healthy diet and regular exercise.  ? ?Recommended follow up: Return in about 6 months (around 05/18/2022) for physical or sooner if needed. ?-cancel may visit ?Future Appointments  ?Date Time Provider [place]  ?01/29/2022  1:00 PM Marin Olp, MD [facility]  ?11/22/2022  8:00 AM [facility] HEALTH COACH [facility]  ? ?Lab/Order associations: ?  ICD-10-CM   ?1. Primary hypertension  I10   ?  ?2. Moderate persistent asthma without complication  W58.09   ?  ?3. Vertigo  R42   ?  ?4. Venous insufficiency  I87.2   ?  ?5. Hyperglycemia  R73.9 Hemoglobin A1c  ?  ?6. Hyperlipidemia, unspecified hyperlipidemia type  E78.5 CBC with Differential/Platelet  ?  Comprehensive metabolic panel  ?  Lipid panel  ?  TSH  ?  ? ?No orders of the defined types were placed in this encounter. ? ?I,Jada Bradford,acting as a scribe for [name], MD.,have documented all relevant documentation on the behalf of [name], MD,as directed by  [name], MD while in the presence of [name], MD. ? ?I, [name], MD, have reviewed all documentation for this visit. The documentation on 11/15/21 for the exam, diagnosis, procedures, and orders are all accurate and complete. ? ? ?Return precautions advised.  ?[name], MD ? ? ?

## 2021-11-15 ENCOUNTER — Ambulatory Visit (INDEPENDENT_AMBULATORY_CARE_PROVIDER_SITE_OTHER): Payer: PPO | Admitting: Family Medicine

## 2021-11-15 ENCOUNTER — Other Ambulatory Visit: Payer: Self-pay

## 2021-11-15 ENCOUNTER — Encounter: Payer: Self-pay | Admitting: Family Medicine

## 2021-11-15 VITALS — BP 130/62 | HR 58 | Temp 97.9°F | Ht 64.0 in | Wt 175.6 lb

## 2021-11-15 DIAGNOSIS — J4540 Moderate persistent asthma, uncomplicated: Secondary | ICD-10-CM | POA: Diagnosis not present

## 2021-11-15 DIAGNOSIS — I10 Essential (primary) hypertension: Secondary | ICD-10-CM | POA: Diagnosis not present

## 2021-11-15 DIAGNOSIS — I872 Venous insufficiency (chronic) (peripheral): Secondary | ICD-10-CM

## 2021-11-15 DIAGNOSIS — R42 Dizziness and giddiness: Secondary | ICD-10-CM

## 2021-11-15 DIAGNOSIS — R739 Hyperglycemia, unspecified: Secondary | ICD-10-CM

## 2021-11-15 DIAGNOSIS — E785 Hyperlipidemia, unspecified: Secondary | ICD-10-CM | POA: Diagnosis not present

## 2021-11-15 NOTE — Patient Instructions (Addendum)
Get your shingrix vaccine at the pharmacy and let us know when you have gotten it. ? ?Schedule a lab visit at the check out desk within 2 weeks. Return for future fasting labs meaning nothing but water after midnight please. Ok to take your medications with water.  ? ? ?Recommended follow up: Return in about 6 months (around 05/18/2022) for physical or sooner if needed.  ?- cancel your physical in may ?

## 2021-11-22 DIAGNOSIS — D225 Melanocytic nevi of trunk: Secondary | ICD-10-CM | POA: Diagnosis not present

## 2021-11-22 DIAGNOSIS — L814 Other melanin hyperpigmentation: Secondary | ICD-10-CM | POA: Diagnosis not present

## 2021-11-22 DIAGNOSIS — L821 Other seborrheic keratosis: Secondary | ICD-10-CM | POA: Diagnosis not present

## 2021-11-22 DIAGNOSIS — L219 Seborrheic dermatitis, unspecified: Secondary | ICD-10-CM | POA: Diagnosis not present

## 2021-11-29 ENCOUNTER — Other Ambulatory Visit (INDEPENDENT_AMBULATORY_CARE_PROVIDER_SITE_OTHER): Payer: PPO

## 2021-11-29 DIAGNOSIS — E785 Hyperlipidemia, unspecified: Secondary | ICD-10-CM | POA: Diagnosis not present

## 2021-11-29 DIAGNOSIS — R739 Hyperglycemia, unspecified: Secondary | ICD-10-CM | POA: Diagnosis not present

## 2021-11-29 LAB — LIPID PANEL
Cholesterol: 178 mg/dL (ref 0–200)
HDL: 36.3 mg/dL — ABNORMAL LOW (ref >39.00)
LDL Cholesterol: 103 mg/dL — ABNORMAL HIGH (ref 0–99)
NonHDL: 141.72
Total CHOL/HDL Ratio: 5
Triglycerides: 196 mg/dL — ABNORMAL HIGH (ref 0.0–149.0)
VLDL: 39.2 mg/dL (ref 0.0–40.0)

## 2021-11-29 LAB — CBC WITH DIFFERENTIAL/PLATELET
Basophils Absolute: 0.1 10*3/uL (ref 0.0–0.1)
Basophils Relative: 1 % (ref 0.0–3.0)
Eosinophils Absolute: 0.4 10*3/uL (ref 0.0–0.7)
Eosinophils Relative: 5.5 % — ABNORMAL HIGH (ref 0.0–5.0)
HCT: 44 % (ref 36.0–46.0)
Hemoglobin: 15.4 g/dL — ABNORMAL HIGH (ref 12.0–15.0)
Lymphocytes Relative: 21.9 % (ref 12.0–46.0)
Lymphs Abs: 1.6 10*3/uL (ref 0.7–4.0)
MCHC: 35 g/dL (ref 30.0–36.0)
MCV: 90.8 fl (ref 78.0–100.0)
Monocytes Absolute: 0.8 10*3/uL (ref 0.1–1.0)
Monocytes Relative: 10.6 % (ref 3.0–12.0)
Neutro Abs: 4.4 10*3/uL (ref 1.4–7.7)
Neutrophils Relative %: 61 % (ref 43.0–77.0)
Platelets: 264 10*3/uL (ref 150.0–400.0)
RBC: 4.85 Mil/uL (ref 3.87–5.11)
RDW: 13.5 % (ref 11.5–15.5)
WBC: 7.1 10*3/uL (ref 4.0–10.5)

## 2021-11-29 LAB — COMPREHENSIVE METABOLIC PANEL
ALT: 27 U/L (ref 0–35)
AST: 28 U/L (ref 0–37)
Albumin: 4.5 g/dL (ref 3.5–5.2)
Alkaline Phosphatase: 91 U/L (ref 39–117)
BUN: 15 mg/dL (ref 6–23)
CO2: 30 mEq/L (ref 19–32)
Calcium: 10.5 mg/dL (ref 8.4–10.5)
Chloride: 99 mEq/L (ref 96–112)
Creatinine, Ser: 0.79 mg/dL (ref 0.40–1.20)
GFR: 68.78 mL/min (ref >60.00)
Glucose, Bld: 174 mg/dL — ABNORMAL HIGH (ref 70–99)
Potassium: 3.7 mEq/L (ref 3.5–5.1)
Sodium: 139 mEq/L (ref 135–145)
Total Bilirubin: 0.7 mg/dL (ref 0.2–1.2)
Total Protein: 7.5 g/dL (ref 6.0–8.3)

## 2021-11-29 LAB — TSH: TSH: 4.91 u[IU]/mL (ref 0.35–5.50)

## 2021-11-29 LAB — HEMOGLOBIN A1C: Hgb A1c MFr Bld: 7.1 % — ABNORMAL HIGH (ref 4.6–6.5)

## 2022-01-14 ENCOUNTER — Other Ambulatory Visit: Payer: Self-pay | Admitting: Family Medicine

## 2022-01-17 ENCOUNTER — Telehealth: Payer: Self-pay | Admitting: Family Medicine

## 2022-01-17 MED ORDER — OMEPRAZOLE 20 MG PO CPDR: DELAYED_RELEASE_CAPSULE | ORAL | 3 refills | Status: DC

## 2022-01-17 NOTE — Telephone Encounter (Signed)
Please reroute Rx to pharmacy below. Pharmacist states pt cannot get refills from [facility]. ? ?.. ?Encourage patient to contact the pharmacy for refills or they can request refills through [facility] ? ?LAST APPOINTMENT DATE:   ?11/15/21 ? ?NEXT APPOINTMENT DATE: ?05/22/22 ? ?MEDICATION: ?omeprazole (PRILOSEC) 20 MG capsule [[id number]]  ? ? ?Is the patient out of medication?  ?unknown ? ?PHARMACY: ?[facility] ?[place] [address] ?[phone] ? ?Let patient know to contact pharmacy at the end of the day to make sure medication is ready. ? ?Please notify patient to allow 48-72 hours to process  ? ?

## 2022-01-17 NOTE — Telephone Encounter (Signed)
Rx sent to requested pharmacy

## 2022-01-29 ENCOUNTER — Encounter: Payer: PPO | Admitting: Family Medicine

## 2022-04-09 ENCOUNTER — Other Ambulatory Visit: Payer: Self-pay | Admitting: Family Medicine

## 2022-05-22 ENCOUNTER — Encounter: Payer: Self-pay | Admitting: Family Medicine

## 2022-05-22 ENCOUNTER — Ambulatory Visit (INDEPENDENT_AMBULATORY_CARE_PROVIDER_SITE_OTHER): Payer: PPO | Admitting: Family Medicine

## 2022-05-22 VITALS — BP 134/68 | HR 56 | Temp 98.7°F | Ht 64.0 in | Wt 162.2 lb

## 2022-05-22 DIAGNOSIS — E785 Hyperlipidemia, unspecified: Secondary | ICD-10-CM

## 2022-05-22 DIAGNOSIS — M5432 Sciatica, left side: Secondary | ICD-10-CM

## 2022-05-22 DIAGNOSIS — E119 Type 2 diabetes mellitus without complications: Secondary | ICD-10-CM

## 2022-05-22 DIAGNOSIS — F410 Panic disorder [episodic paroxysmal anxiety] without agoraphobia: Secondary | ICD-10-CM

## 2022-05-22 DIAGNOSIS — I10 Essential (primary) hypertension: Secondary | ICD-10-CM

## 2022-05-22 DIAGNOSIS — Z0000 Encounter for general adult medical examination without abnormal findings: Secondary | ICD-10-CM | POA: Diagnosis not present

## 2022-05-22 DIAGNOSIS — M85851 Other specified disorders of bone density and structure, right thigh: Secondary | ICD-10-CM | POA: Diagnosis not present

## 2022-05-22 NOTE — Patient Instructions (Addendum)
-  consider shingrix at pharmacy -also due for Tdap (tetanus) at pharmacy  For next eye exam- please let them know you have diabetes and send [place] a copy  We will call you within two weeks about your referral to [facility]- you can also ask him about possible orthotics. If you do not hear within 2 weeks, give [place] a call.   Calcium: '1200mg'$  (through diet only given higher calcium in past) recommended  -team give calcium in diet handout please Vitamin D: 1000 units a day recommended  Schedule your bone density test at check out desk.  - located [address] across the street from [place] - in the basement - you DO NEED an appointment for the bone density tests.   Schedule a lab visit at the check out desk within 2 weeks. Return for future fasting labs meaning nothing but water after midnight please. Ok to take your medications with water.   Recommended follow up: Return in about 6 months (around 11/20/2022) for followup or sooner if needed.Schedule b4 you leave.

## 2022-05-22 NOTE — Progress Notes (Signed)
Phone [phone]   Subjective:  Patient presents today for their annual physical. Chief complaint-noted.   See problem oriented charting- ROS- full  review of systems was completed and negative except for: dental problems, post nasal drip from allergies- can cause cough, back pain- ongoing issues- DDD x-ray 2019- not reported worsening, leg swelling  The following were reviewed and entered/updated in epic: Past Medical History:  Diagnosis Date   Abnormal uterine bleeding    Allergic rhinitis    Allergy    seasonal   Anxiety    panic attacks   Anxiety disorder    Asthma    Cataract    left eye surgery to removed   Cataract    right eye   Colorectal cancer ([place]) 1997   T3, N0   Depression    pt states she isn't depressed anymore, continues paxil daily   Endometriosis    Fibroid    GERD (gastroesophageal reflux disease)    Hemorrhoids    Hyperlipidemia    diet controlled, no meds   Hypertension    PONV (postoperative nausea and vomiting)    Prediabetes    Seasonal allergies    SVD (spontaneous vaginal delivery)    x 3   Thyroid disease    Urinary incontinence    Patient Active Problem List   Diagnosis Date Noted   Diabetes mellitus without complication ([place]) 47/82/9562    Priority: High   Osteopenia 10/02/2015    Priority: High   Venous insufficiency 11/15/2021    Priority: Medium    Hyperlipidemia 01/15/2018    Priority: Medium    Vertigo 07/15/2017    Priority: Medium    Hypertension     Priority: Medium    Panic attacks     Priority: Medium    History of colorectal cancer     Priority: Medium    Asthma     Priority: Medium    Left leg pain 12/20/2016    Priority: Low   Hemorrhoids 12/20/2016    Priority: Low   Dermatitis 12/20/2016    Priority: Low   Systolic murmur 13/04/6577    Priority: Low   Allergic rhinitis     Priority: Low   GERD 06/22/2009    Priority: Low   Past Surgical History:  Procedure Laterality Date   cataract surgery  Left    [place] N/A 11/21/2015   Procedure: DILATATION & CURETTAGE/HYSTEROSCOPY WITH MYOSURE;  Surgeon: Salvadore Dom, MD;  Location: [facility];  Service: Gynecology;  Laterality: N/A;   DILATION AND CURETTAGE OF UTERUS     D & C   POLYPECTOMY     TONSILLECTOMY      Family History  Problem Relation Age of Onset   Sudden death Brother 35       Apparent DVT/PE. all males   Heart attack Brother    Varicose Veins Mother    Hypertension Father    Sudden death Cousin 38       Paternal cousin -dvt/pe 7 all males   Coronary artery disease Cousin 39       Paternal cousin, MI   Breast cancer Daughter 79   Stroke Maternal Grandmother    Stroke Maternal Grandfather    Early death Paternal Grandmother    Tuberculosis Paternal Grandmother    Early death Paternal Grandfather    Tuberculosis Paternal  Grandfather    Hypertension Son    Colon cancer Neg Hx    Rectal cancer Neg Hx    Stomach cancer Neg Hx     Medications- reviewed and updated Current Outpatient Medications  Medication Sig Dispense Refill   albuterol (VENTOLIN HFA) 108 (90 Base) MCG/ACT inhaler Inhale 2 puffs into the lungs every 6 hours as needed for wheezing or shortness of breath. 8.5 g 2   amLODipine (NORVASC) 10 MG tablet TAKE 1 TABLET BY MOUTH EVERY DAY 90 tablet 3   augmented betamethasone dipropionate (DIPROLENE-AF) 0.05 % cream      budesonide-formoterol (SYMBICORT) 160-4.5 MCG/ACT inhaler INHALE 2 PUFFS into lungs 2 TIMES DAILY 10.2 g 5   Chlorpheniramine-DM (CORICIDIN HBP COUGH/COLD PO) Take by mouth.     clobetasol (TEMOVATE) 0.05 % external solution Apply 1 application topically 2 (two) times daily. For 7-10 days up to twice a day maximum. Use only on scalp 50 mL 0   hydrocortisone (ANUSOL-HC) 2.5 % rectal cream Place 1 application rectally 2 times daily as needed for hemorrhoids. 28.35 g 1    lisinopril-hydrochlorothiazide (ZESTORETIC) 20-25 MG tablet TAKE ONE TABLET ONCE DAILY 30 tablet 2   meclizine (ANTIVERT) 25 MG tablet TAKE 1 TABLET BY MOUTH 2 TIMES DAILY AS NEEDED FOR vertigo OR dizziness. 30 tablet 0   omeprazole (PRILOSEC) 20 MG capsule TAKE 1 CAPSULE BY MOUTH EVERY DAY 90 capsule 3   PARoxetine (PAXIL) 20 MG tablet TAKE 1 TABLET BY MOUTH EVERY DAY 90 tablet 1   No current facility-administered medications for this visit.    Allergies-reviewed and updated Allergies  Allergen Reactions   Erythromycin     unknown   Fentanyl Hives   Heparin     questionable, unknown   Hydrocodone-Acetaminophen Hives   Meperidine Hcl     unknown   Penicillins     Has patient had a PCN reaction causing immediate rash, facial/tongue/throat swelling, SOB or lightheadedness with hypotension: no Has patient had a PCN reaction causing severe rash involving mucus membranes or skin necrosis: no Has patient had a PCN reaction that required hospitalization no Has patient had a PCN reaction occurring within the last 10 years: no If all of the above answers are "NO", then may proceed with Cephalosporin use.    Procaine Hcl     Could not sleep   Promethazine Hcl     unknown   Temazepam Hives    unknown    Social History   Social History Narrative   Divorced (ex husband later passed- they were close) Lives alone.  Two children- one daughter lives close. 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 Oropharynx normal Neck:  no thyromegaly CV: slightly bradycardic but regular no rubs or gallops Lungs: CTAB no crackles, wheeze, rhonchi Abdomen: soft/nontender/nondistended/normal bowel sounds. No rebound or guarding.  Ext: trace edema Skin: warm, dry Neuro: grossly normal, moves all extremities, PERRLA  Diabetic Foot  Exam - Simple   Simple Foot Form Diabetic Foot exam was performed with the following findings: Yes 05/22/2022  1:44 PM  Visual Inspection No deformities, no ulcerations, no other skin breakdown bilaterally: Yes Sensation Testing Intact to touch and monofilament testing bilaterally: Yes Pulse Check Posterior Tibialis and Dorsalis pulse intact bilaterally: Yes Comments      Assessment and Plan   86 y.o. female presenting for annual physical.  Health Maintenance counseling: 1. Anticipatory guidance: Patient counseled regarding regular dental exams - q6 months and having regular dental work, eye exams - yearly,  avoiding smoking and second hand smoke , limiting alcohol to 1 beverage per day- rare white wine twice a year , no illicit drugs.   2. Risk factor reduction:  Advised patient of need for regular exercise and diet rich and fruits and vegetables to reduce risk of heart attack and stroke.  Exercise- spending time in yard.  Diet/weight management- weight down 15 lbs in last year- has worked hard trying to get a1c back down- improved dietary choices Wt Readings from Last 3 Encounters:  05/22/22 162 lb 3.2 oz (73.6 kg)  11/15/21 175 lb 9.6 oz (79.7 kg)  10/26/21 176 lb (79.8 kg)  3. Immunizations/screenings/ancillary studies-  declines flu. Considering shingrix-at pharmacy. Discussed Tdap at pharmacy. Declines covid shot Immunization History  Administered Date(s) Administered   Pneumococcal Conjugate-13 08/09/2014   Pneumococcal Polysaccharide-23 10/19/2015   4. Cervical cancer screening- past age based screening recommendations 5. Breast cancer screening-  breast exam declines and  mammogram-past age based screening recommendations 6. Colon cancer screening - History of colorectal cancer status post surgical removal and chemotherapy-last colonoscopy January 2016 and reports was told needed no further colonoscopy. No blood in stool or melena  7. Skin cancer screening- had updated derm visit and was encouraging within last year. advised regular sunscreen use. Denies worrisome, changing, or new skin lesions.  8. Birth control/STD check- not dating and not sexually active 9. Osteoporosis screening at 50- see below 10. Smoking associated screening - never smoker  Status of chronic or acute concerns   #social update- moved back into childhood home- really loves it- great memories. Enjoys working outdoors. [place] friend from hs [place].   #allergies- post nasal drip from allergies- can cause cough- also lisinopril could contribute. Flonase occasionally but can get headache  #back pain overall stable from 2019- DDD at that time- does get pain into legs at times (mainly left) and sometimes into feet on left- could refer to sports medicine or neurology  #occasional leg swelling if increases salt intake (amlodipine likely contributes as well)  # Diabetes-New diagnosis 11/29/2021 with A1c 7.1 S: Medication: hopefully diet controlled  A/P: hopefully stable- update a1c. Continue current meds for now  #hypertension with history of orthostatic issues and blood pressure goal less than 150/90 S: medication: Amlodipine 10 mg, lisinopril hydrochlorothiazide 20-25 mg (cough in past) A/P: Controlled. Continue current medications.   #hyperlipidemia-have opted to not start medication for primary prevention over age 76 S: Medication:none  A/P: hold off on meds for primary prevention at her age- update lipids with labs  #Panic attacks-have been well controlled on Paxil 20 mg- doing well on only half tablet  -QA:STMH   # Asthma S: Maintenance Medication:  Symbicort 160-4.52 puffs twice daily As needed medication: albuterol. Patient is using this almost never- perhaps once a month - less in winter.  A/P: Controlled. Continue current medications.    #  Low Bone density (formerly osteopenia) S: Last DEXA: Worst T score -2.4 on 09/27/2015 and right femoral neck  Calcium: '1200mg'$  (through diet ok) recommended  Vitamin D: 1000 units a day recommended A/P: hopefully stable- update dexa. Continue current meds for now   # GERD S:Medication: Omeprazole 20 mg -Has had normal B12 in the past  A/P: Controlled. Continue current medications.   Recommended follow up: Return in about 6 months (around 11/20/2022) for followup or sooner if needed.Schedule b4 you leave. Future Appointments  Date Time Provider [place]  11/22/2022  8:00 AM [facility]   Lab/Order associations:will come back fasting   ICD-10-CM   1. Preventative health care  Z00.00     2. Diabetes mellitus without complication (HCC)  Q73.4 CBC with Differential/Platelet    Comprehensive metabolic panel    Lipid panel    Hemoglobin A1c    3. Osteopenia of neck of right femur  M85.851 DG Bone Density    4. Hyperlipidemia, unspecified hyperlipidemia type  E78.5     5. Primary hypertension  I10     6. Panic attacks  F41.0     7. Back pain with left-sided sciatica  M54.32 Ambulatory referral to Sports Medicine      No orders of the defined types were placed in this encounter.   Return precautions advised.  [name], MD

## 2022-06-05 ENCOUNTER — Other Ambulatory Visit (INDEPENDENT_AMBULATORY_CARE_PROVIDER_SITE_OTHER): Payer: PPO

## 2022-06-05 DIAGNOSIS — E119 Type 2 diabetes mellitus without complications: Secondary | ICD-10-CM

## 2022-06-05 LAB — LIPID PANEL
Cholesterol: 160 mg/dL (ref 0–200)
HDL: 33.7 mg/dL — ABNORMAL LOW (ref >39.00)
LDL Cholesterol: 104 mg/dL — ABNORMAL HIGH (ref 0–99)
NonHDL: 126.02
Total CHOL/HDL Ratio: 5
Triglycerides: 111 mg/dL (ref 0.0–149.0)
VLDL: 22.2 mg/dL (ref 0.0–40.0)

## 2022-06-05 LAB — CBC WITH DIFFERENTIAL/PLATELET
Basophils Absolute: 0 10*3/uL (ref 0.0–0.1)
Basophils Relative: 0.6 % (ref 0.0–3.0)
Eosinophils Absolute: 0.3 10*3/uL (ref 0.0–0.7)
Eosinophils Relative: 4.7 % (ref 0.0–5.0)
HCT: 41.7 % (ref 36.0–46.0)
Hemoglobin: 14.7 g/dL (ref 12.0–15.0)
Lymphocytes Relative: 21.1 % (ref 12.0–46.0)
Lymphs Abs: 1.3 10*3/uL (ref 0.7–4.0)
MCHC: 35.4 g/dL (ref 30.0–36.0)
MCV: 91.1 fl (ref 78.0–100.0)
Monocytes Absolute: 0.7 10*3/uL (ref 0.1–1.0)
Monocytes Relative: 12.1 % — ABNORMAL HIGH (ref 3.0–12.0)
Neutro Abs: 3.8 10*3/uL (ref 1.4–7.7)
Neutrophils Relative %: 61.5 % (ref 43.0–77.0)
Platelets: 233 10*3/uL (ref 150.0–400.0)
RBC: 4.57 Mil/uL (ref 3.87–5.11)
RDW: 13.3 % (ref 11.5–15.5)
WBC: 6.2 10*3/uL (ref 4.0–10.5)

## 2022-06-05 LAB — COMPREHENSIVE METABOLIC PANEL
ALT: 16 U/L (ref 0–35)
AST: 22 U/L (ref 0–37)
Albumin: 4.1 g/dL (ref 3.5–5.2)
Alkaline Phosphatase: 78 U/L (ref 39–117)
BUN: 15 mg/dL (ref 6–23)
CO2: 33 mEq/L — ABNORMAL HIGH (ref 19–32)
Calcium: 10.5 mg/dL (ref 8.4–10.5)
Chloride: 99 mEq/L (ref 96–112)
Creatinine, Ser: 0.78 mg/dL (ref 0.40–1.20)
GFR: 69.59 mL/min (ref >60.00)
Glucose, Bld: 141 mg/dL — ABNORMAL HIGH (ref 70–99)
Potassium: 3.8 mEq/L (ref 3.5–5.1)
Sodium: 139 mEq/L (ref 135–145)
Total Bilirubin: 0.7 mg/dL (ref 0.2–1.2)
Total Protein: 7.5 g/dL (ref 6.0–8.3)

## 2022-06-05 LAB — HEMOGLOBIN A1C: Hgb A1c MFr Bld: 6.2 % (ref 4.6–6.5)

## 2022-07-08 ENCOUNTER — Telehealth: Payer: Self-pay | Admitting: Family Medicine

## 2022-07-08 NOTE — Telephone Encounter (Signed)
  LAST APPOINTMENT DATE:  Please schedule appointment if longer than 1 year 05/22/22  NEXT APPOINTMENT DATE: 11/21/22  MEDICATION: PARoxetine (PAXIL) 20 MG tablet    Is the patient out of medication? Approx. 1 week left  PHARMACY: [facility], [place] Phone: [phone]  Fax: [phone]      Let patient know to contact pharmacy at the end of the day to make sure medication is ready.  Please notify patient to allow 48-72 hours to process

## 2022-07-09 ENCOUNTER — Other Ambulatory Visit: Payer: Self-pay

## 2022-07-09 DIAGNOSIS — F411 Generalized anxiety disorder: Secondary | ICD-10-CM

## 2022-07-09 MED ORDER — PAROXETINE HCL 20 MG PO TABS: 20.0000 mg | ORAL_TABLET | Freq: Every day | ORAL | 3 refills | Status: DC

## 2022-07-09 NOTE — Telephone Encounter (Signed)
Refill sent to [facility] °

## 2022-07-22 ENCOUNTER — Other Ambulatory Visit: Payer: Self-pay | Admitting: Family Medicine

## 2022-07-30 DIAGNOSIS — H35033 Hypertensive retinopathy, bilateral: Secondary | ICD-10-CM | POA: Diagnosis not present

## 2022-07-30 DIAGNOSIS — H40023 Open angle with borderline findings, high risk, bilateral: Secondary | ICD-10-CM | POA: Diagnosis not present

## 2022-10-21 ENCOUNTER — Other Ambulatory Visit: Payer: Self-pay | Admitting: Family Medicine

## 2022-11-05 DIAGNOSIS — H35033 Hypertensive retinopathy, bilateral: Secondary | ICD-10-CM | POA: Diagnosis not present

## 2022-11-05 DIAGNOSIS — H40023 Open angle with borderline findings, high risk, bilateral: Secondary | ICD-10-CM | POA: Diagnosis not present

## 2022-11-18 ENCOUNTER — Other Ambulatory Visit: Payer: Self-pay | Admitting: Family Medicine

## 2022-11-18 DIAGNOSIS — J4541 Moderate persistent asthma with (acute) exacerbation: Secondary | ICD-10-CM

## 2022-11-21 ENCOUNTER — Ambulatory Visit (INDEPENDENT_AMBULATORY_CARE_PROVIDER_SITE_OTHER): Payer: PPO | Admitting: Family Medicine

## 2022-11-21 ENCOUNTER — Encounter: Payer: Self-pay | Admitting: Family Medicine

## 2022-11-21 VITALS — BP 138/68 | HR 58 | Temp 98.1°F | Ht 64.0 in | Wt 166.8 lb

## 2022-11-21 DIAGNOSIS — E785 Hyperlipidemia, unspecified: Secondary | ICD-10-CM | POA: Diagnosis not present

## 2022-11-21 DIAGNOSIS — I10 Essential (primary) hypertension: Secondary | ICD-10-CM | POA: Diagnosis not present

## 2022-11-21 DIAGNOSIS — E119 Type 2 diabetes mellitus without complications: Secondary | ICD-10-CM

## 2022-11-21 DIAGNOSIS — J4540 Moderate persistent asthma, uncomplicated: Secondary | ICD-10-CM | POA: Diagnosis not present

## 2022-11-21 LAB — COMPREHENSIVE METABOLIC PANEL
ALT: 14 U/L (ref 0–35)
AST: 20 U/L (ref 0–37)
Albumin: 4.1 g/dL (ref 3.5–5.2)
Alkaline Phosphatase: 84 U/L (ref 39–117)
BUN: 12 mg/dL (ref 6–23)
CO2: 33 mEq/L — ABNORMAL HIGH (ref 19–32)
Calcium: 10.6 mg/dL — ABNORMAL HIGH (ref 8.4–10.5)
Chloride: 99 mEq/L (ref 96–112)
Creatinine, Ser: 0.86 mg/dL (ref 0.40–1.20)
GFR: 61.7 mL/min (ref >60.00)
Glucose, Bld: 131 mg/dL — ABNORMAL HIGH (ref 70–99)
Potassium: 3.8 mEq/L (ref 3.5–5.1)
Sodium: 139 mEq/L (ref 135–145)
Total Bilirubin: 0.6 mg/dL (ref 0.2–1.2)
Total Protein: 7.3 g/dL (ref 6.0–8.3)

## 2022-11-21 LAB — HEMOGLOBIN A1C: Hgb A1c MFr Bld: 6.5 % (ref 4.6–6.5)

## 2022-11-21 LAB — MICROALBUMIN / CREATININE URINE RATIO
Creatinine,U: 98 mg/dL
Microalb Creat Ratio: 6.7 mg/g (ref 0.0–30.0)
Microalb, Ur: 6.6 mg/dL — ABNORMAL HIGH (ref 0.0–1.9)

## 2022-11-21 MED ORDER — ALBUTEROL SULFATE HFA 108 (90 BASE) MCG/ACT IN AERS: 2.0000 | INHALATION_SPRAY | Freq: Four times a day (QID) | RESPIRATORY_TRACT | 2 refills | Status: DC | PRN

## 2022-11-21 NOTE — Progress Notes (Signed)
Phone [phone] In person visit   Subjective:   Tammie [name] is a 88 y.o. year old very pleasant female patient who presents for/with See problem oriented charting Chief Complaint  Patient presents with   Follow-up    (No Mask).   Hypertension   Past Medical History-  Patient Active Problem List   Diagnosis Date Noted   Diabetes mellitus without complication ([place]) [id number]    Priority: High   Osteopenia 10/02/2015    Priority: High   Venous insufficiency 11/15/2021    Priority: Medium    Hyperlipidemia 01/15/2018    Priority: Medium    Vertigo 07/15/2017    Priority: Medium    Hypertension     Priority: Medium    Panic attacks     Priority: Medium    History of colorectal cancer     Priority: Medium    Asthma     Priority: Medium    Left leg pain 12/20/2016    Priority: Low   Hemorrhoids 12/20/2016    Priority: Low   Dermatitis 12/20/2016    Priority: Low   Systolic murmur [id number]    Priority: Low   Allergic rhinitis     Priority: Low   GERD 06/22/2009    Priority: Low    Medications- reviewed and updated Current Outpatient Medications  Medication Sig Dispense Refill   amLODipine (NORVASC) 10 MG tablet TAKE 1 TABLET BY MOUTH EVERY DAY 90 tablet 3   augmented betamethasone dipropionate (DIPROLENE-AF) 0.05 % cream      budesonide-formoterol (SYMBICORT) 160-4.5 MCG/ACT inhaler INHALE 2 PUFFS TWICE DAILY 10.2 g 3   clobetasol (TEMOVATE) 0.05 % external solution Apply 1 application topically 2 (two) times daily. For 7-10 days up to twice a day maximum. Use only on scalp 50 mL 0   hydrocortisone (ANUSOL-HC) 2.5 % rectal cream Place 1 application rectally 2 times daily as needed for hemorrhoids. 28.35 g 1   lisinopril-hydrochlorothiazide (ZESTORETIC) 20-25 MG tablet TAKE ONE TABLET ONCE DAILY 30 tablet 2   meclizine (ANTIVERT) 25 MG tablet TAKE 1 TABLET BY MOUTH 2 TIMES DAILY AS NEEDED FOR vertigo OR dizziness. 30 tablet 0   omeprazole (PRILOSEC) 20 MG  capsule TAKE 1 CAPSULE BY MOUTH EVERY DAY 90 capsule 3   PARoxetine (PAXIL) 20 MG tablet Take 1 tablet (20 mg total) by mouth daily. 90 tablet 3   albuterol (VENTOLIN HFA) 108 (90 Base) MCG/ACT inhaler Inhale 2 puffs into the lungs every 6 (six) hours as needed for wheezing or shortness of breath. 18 g 2   Chlorpheniramine-DM (CORICIDIN HBP COUGH/COLD PO) Take by mouth.     No current facility-administered medications for this visit.     Objective:  BP 138/68   Pulse (!) 58   Temp 98.1 F (36.7 C)   Ht '5\' 4"'$  (1.626 m)   Wt 166 lb 12.8 oz (75.7 kg)   SpO2 98%   BMI 28.63 kg/m  Gen: NAD, resting comfortably CV: RRR faint stable murmur Lungs: CTAB no crackles, wheeze, rhonchi Ext: trace edema L >R  Skin: warm, dry    Assessment and Plan   #social update- will be grandmother again! Son is 55- still drives to [place] to visit them  # Diabetes-New diagnosis 11/29/2021 with A1c 7.1 S: Medication:diet controlled  CBGs- no checks Exercise and diet- weight up a few lbs from last visit. Exercise every other day Lab Results  Component Value Date   HGBA1C 6.2 06/05/2022   HGBA1C 7.1 (H) 11/29/2021  HGBA1C 6.3 10/26/2019  A/P: hopefully stable- update a1c today. Continue without meds for now  #hypertension with history of orthostatic issues and blood pressure goal less than 150/90 S: medication: Amlodipine 10 mg, lisinopril  hydrochlorothiazide 20-25 mg Home readings #s: not checking- gave cuff to [facility] BP Readings from Last 3 Encounters:  11/21/22 138/68  05/22/22 134/68  11/15/21 130/62  A/P:  stable- continue current medicines . Reasonable control for her age and history of orthostatic symptoms  #Panic attacks-have been well controlled on Paxil 20 mg - only on half tablet though -SI: no thoughts of self harm   # Asthma/allergies S: Maintenance Medication: Symbicort 160-4.52 puffs twice daily- just started back due to allergy season but was told not covered  anymore.  - flonase caued headache with prolonged use and felt off- could try xyzal As needed medication: albuterol but not using.  A/P: asthma doing ok except with recent allergies- is going to try xyzal and has had to start back on symbicort but sounds like will eneed alternate   Recommended follow up: Return in about 6 months (around 05/24/2023) for physical or sooner if needed.Schedule b4 you leave. Future Appointments  Date Time Provider [place]  11/28/2022  3:00 PM [facility]   Lab/Order associations:   ICD-10-CM   1. Diabetes mellitus without complication (HCC)  [id number] Microalbumin / creatinine urine ratio    Comprehensive metabolic panel    Hemoglobin A1c    2. Primary hypertension  I10     3. Hyperlipidemia, unspecified hyperlipidemia type  E78.5     4. Moderate persistent asthma without complication  123456       Meds ordered this encounter  Medications   albuterol (VENTOLIN HFA) 108 (90 Base) MCG/ACT inhaler    Sig: Inhale 2 puffs into the lungs every 6 (six) hours as needed for wheezing or shortness of breath.    Dispense:  18 g    Refill:  2    Return precautions advised.  [name], MD

## 2022-11-21 NOTE — Patient Instructions (Addendum)
Get your diabetic eye exam scheduled.  Check with your pharmacist- have them let me know which inhalers are covered since you are being told symbicort is not. You could also call your insurance.   Consider trial of xyzal at night fo allergies  Please stop by lab before you go If you have mychart- we will send your results within 3 business days of [place] receiving them.  If you do not have mychart- we will call you about results within 5 business days of [place] receiving them.  *please also note that you will see labs on mychart as soon as they post. I will later go in and write notes on them- will say "notes from Dr. Yong Channel"    Recommended follow up: Return in about 6 months (around 05/24/2023) for physical or sooner if needed.Schedule b4 you leave.

## 2022-11-28 ENCOUNTER — Ambulatory Visit (INDEPENDENT_AMBULATORY_CARE_PROVIDER_SITE_OTHER): Payer: PPO

## 2022-11-28 VITALS — Wt 166.0 lb

## 2022-11-28 DIAGNOSIS — Z0000 Encounter for general adult medical examination without abnormal findings: Secondary | ICD-10-CM | POA: Diagnosis not present

## 2022-11-28 NOTE — Patient Instructions (Signed)
Tammie [name] , Thank you for taking time to come for your Medicare Wellness Visit. I appreciate your ongoing commitment to your health goals. Please review the following plan we discussed and let me know if I can assist you in the future.   These are the goals we discussed:  Goals      Patient Stated     Lose weight and get back to exercise      Patient Stated     Start walking more      Patient Stated     To do exercise      Weight (lb) < 200 lb (90.7 kg)     Wants to be more physically active and lose some weight        This is a list of the screening recommended for you and due dates:  Health Maintenance  Topic Date Due   Eye exam for diabetics  Never done   COVID-19 Vaccine (1) 12/07/2022*   Zoster (Shingles) Vaccine (1 of 2) 02/21/2023*   Flu Shot  08/07/2035*   Complete foot exam   05/23/2023   Hemoglobin A1C  05/24/2023   Yearly kidney function blood test for diabetes  11/21/2023   Yearly kidney health urinalysis for diabetes  11/21/2023   Medicare Annual Wellness Visit  11/28/2023   Pneumonia Vaccine  Completed   DEXA scan (bone density measurement)  Completed   HPV Vaccine  Aged Out   DTaP/Tdap/Td vaccine  Discontinued  *Topic was postponed. The date shown is not the original due date.    Advanced directives: Please bring a copy of your health care power of attorney and living will to the office at your convenience.  Conditions/risks identified: more exercise   Next appointment: Follow up in one year for your annual wellness visit    Preventive Care 65 Years and Older, Female Preventive care refers to lifestyle choices and visits with your health care provider that can promote health and wellness. What does preventive care include? A yearly physical exam. This is also called an annual well check. Dental exams once or twice a year. Routine eye exams. Ask your health care provider how often you should have your eyes checked. Personal lifestyle choices,  including: Daily care of your teeth and gums. Regular physical activity. Eating a healthy diet. Avoiding tobacco and drug use. Limiting alcohol use. Practicing safe sex. Taking low-dose aspirin every day. Taking vitamin and mineral supplements as recommended by your health care provider. What happens during an annual well check? The services and screenings done by your health care provider during your annual well check will depend on your age, overall health, lifestyle risk factors, and family history of disease. Counseling  Your health care provider may ask you questions about your: Alcohol use. Tobacco use. Drug use. Emotional well-being. Home and relationship well-being. Sexual activity. Eating habits. History of falls. Memory and ability to understand (cognition). Work and work Statistician. Reproductive health. Screening  You may have the following tests or measurements: Height, weight, and BMI. Blood pressure. Lipid and cholesterol levels. These may be checked every 5 years, or more frequently if you are over 40 years old. Skin check. Lung cancer screening. You may have this screening every year starting at age 20 if you have a 30-pack-year history of smoking and currently smoke or have quit within the past 15 years. Fecal occult blood test (FOBT) of the stool. You may have this test every year starting at age 25. Flexible sigmoidoscopy or  colonoscopy. You may have a sigmoidoscopy every 5 years or a colonoscopy every 10 years starting at age 5. Hepatitis C blood test. Hepatitis B blood test. Sexually transmitted disease (STD) testing. Diabetes screening. This is done by checking your blood sugar (glucose) after you have not eaten for a while (fasting). You may have this done every 1-3 years. Bone density scan. This is done to screen for osteoporosis. You may have this done starting at age 19. Mammogram. This may be done every 1-2 years. Talk to your health care provider  about how often you should have regular mammograms. Talk with your health care provider about your test results, treatment options, and if necessary, the need for more tests. Vaccines  Your health care provider may recommend certain vaccines, such as: Influenza vaccine. This is recommended every year. Tetanus, diphtheria, and acellular pertussis (Tdap, Td) vaccine. You may need a Td booster every 10 years. Zoster vaccine. You may need this after age 37. Pneumococcal 13-valent conjugate (PCV13) vaccine. One dose is recommended after age 70. Pneumococcal polysaccharide (PPSV23) vaccine. One dose is recommended after age 71. Talk to your health care provider about which screenings and vaccines you need and how often you need them. This information is not intended to replace advice given to you by your health care provider. Make sure you discuss any questions you have with your health care provider. Document Released: 09/29/2015 Document Revised: 05/22/2016 Document Reviewed: 07/04/2015 Elsevier Interactive Patient Education  2017 [place] Prevention in the Home Falls can cause injuries. They can happen to people of all ages. There are many things you can do to make your home safe and to help prevent falls. What can I do on the outside of my home? Regularly fix the edges of walkways and driveways and fix any cracks. Remove anything that might make you trip as you walk through a door, such as a raised step or threshold. Trim any bushes or trees on the path to your home. Use bright outdoor lighting. Clear any walking paths of anything that might make someone trip, such as rocks or tools. Regularly check to see if handrails are loose or broken. Make sure that both sides of any steps have handrails. Any raised decks and porches should have guardrails on the edges. Have any leaves, snow, or ice cleared regularly. Use sand or salt on walking paths during winter. Clean up any spills in  your garage right away. This includes oil or grease spills. What can I do in the bathroom? Use night lights. Install grab bars by the toilet and in the tub and shower. Do not use towel bars as grab bars. Use non-skid mats or decals in the tub or shower. If you need to sit down in the shower, use a plastic, non-slip stool. Keep the floor dry. Clean up any water that spills on the floor as soon as it happens. Remove soap buildup in the tub or shower regularly. Attach bath mats securely with double-sided non-slip rug tape. Do not have throw rugs and other things on the floor that can make you trip. What can I do in the bedroom? Use night lights. Make sure that you have a light by your bed that is easy to reach. Do not use any sheets or blankets that are too big for your bed. They should not hang down onto the floor. Have a firm chair that has side arms. You can use this for support while you get dressed. Do not  have throw rugs and other things on the floor that can make you trip. What can I do in the kitchen? Clean up any spills right away. Avoid walking on wet floors. Keep items that you use a lot in easy-to-reach places. If you need to reach something above you, use a strong step stool that has a grab bar. Keep electrical cords out of the way. Do not use floor polish or wax that makes floors slippery. If you must use wax, use non-skid floor wax. Do not have throw rugs and other things on the floor that can make you trip. What can I do with my stairs? Do not leave any items on the stairs. Make sure that there are handrails on both sides of the stairs and use them. Fix handrails that are broken or loose. Make sure that handrails are as long as the stairways. Check any carpeting to make sure that it is firmly attached to the stairs. Fix any carpet that is loose or worn. Avoid having throw rugs at the top or bottom of the stairs. If you do have throw rugs, attach them to the floor with carpet  tape. Make sure that you have a light switch at the top of the stairs and the bottom of the stairs. If you do not have them, ask someone to add them for you. What else can I do to help prevent falls? Wear shoes that: Do not have high heels. Have rubber bottoms. Are comfortable and fit you well. Are closed at the toe. Do not wear sandals. If you use a stepladder: Make sure that it is fully opened. Do not climb a closed stepladder. Make sure that both sides of the stepladder are locked into place. Ask someone to hold it for you, if possible. Clearly mark and make sure that you can see: Any grab bars or handrails. First and last steps. Where the edge of each step is. Use tools that help you move around (mobility aids) if they are needed. These include: Canes. Walkers. Scooters. Crutches. Turn on the lights when you go into a dark area. Replace any light bulbs as soon as they burn out. Set up your furniture so you have a clear path. Avoid moving your furniture around. If any of your floors are uneven, fix them. If there are any pets around you, be aware of where they are. Review your medicines with your doctor. Some medicines can make you feel dizzy. This can increase your chance of falling. Ask your doctor what other things that you can do to help prevent falls. This information is not intended to replace advice given to you by your health care provider. Make sure you discuss any questions you have with your health care provider. Document Released: 06/29/2009 Document Revised: 02/08/2016 Document Reviewed: 10/07/2014 Elsevier Interactive Patient Education  2017 [employer].

## 2022-11-28 NOTE — Progress Notes (Signed)
I connected with  Tammie [name] on 11/28/22 by a audio enabled telemedicine application and verified that I am speaking with the correct person using two identifiers.  Patient Location: Home  Provider Location: Office/Clinic   Patient Medicare AWV questionnaire was completed by the patient on 11/28/22; I have confirmed that all information answered by patient is correct and no changes since this date.     I discussed the limitations of evaluation and management by telemedicine. The patient expressed understanding and agreed to proceed.   Subjective:   Tammie [name] is a 88 y.o. female who presents for Medicare Annual (Subsequent) preventive examination.  Review of Systems     Cardiac Risk Factors include: advanced age (>16mn, >>52women);dyslipidemia;hypertension;obesity (BMI >30kg/m2)     Objective:    Today's Vitals   11/28/22 1441  Weight: 166 lb (75.3 kg)   Body mass index is 28.49 kg/m.     11/28/2022    2:53 PM 11/08/2021    8:16 AM 11/02/2020    2:10 PM 07/27/2019    3:24 PM 06/10/2018    4:47 PM 11/21/2015    6:29 AM 11/20/2015    3:20 PM  Advanced Directives  Does Patient Have a Medical Advance Directive? Yes Yes Yes Yes Yes Yes Yes  Type of [place] will Healthcare Power of [place]Living will [place]Living will [place] will  [place] will  Does patient want to make changes to medical advance directive?    No - Patient declined No - Patient declined    Copy of H[place] Chart? No - copy requested No - copy requested  No - copy requested No - copy requested  No - copy requested    Current Medications (verified) Outpatient Encounter Medications as of 11/28/2022  Medication Sig   albuterol (VENTOLIN HFA) 108 (90 Base) MCG/ACT inhaler Inhale 2 puffs into the lungs every 6 (six) hours as needed for wheezing or shortness of  breath.   amLODipine (NORVASC) 10 MG tablet TAKE 1 TABLET BY MOUTH EVERY DAY   augmented betamethasone dipropionate (DIPROLENE-AF) 0.05 % cream    clobetasol (TEMOVATE) 0.05 % external solution Apply 1 application topically 2 (two) times daily. For 7-10 days up to twice a day maximum. Use only on scalp   lisinopril-hydrochlorothiazide (ZESTORETIC) 20-25 MG tablet TAKE ONE TABLET ONCE DAILY   omeprazole (PRILOSEC) 20 MG capsule TAKE 1 CAPSULE BY MOUTH EVERY DAY   PARoxetine (PAXIL) 20 MG tablet Take 1 tablet (20 mg total) by mouth daily.   budesonide-formoterol (SYMBICORT) 160-4.5 MCG/ACT inhaler INHALE 2 PUFFS TWICE DAILY (Patient not taking: Reported on 11/28/2022)   hydrocortisone (ANUSOL-HC) 2.5 % rectal cream Place 1 application rectally 2 times daily as needed for hemorrhoids. (Patient not taking: Reported on 11/28/2022)   meclizine (ANTIVERT) 25 MG tablet TAKE 1 TABLET BY MOUTH 2 TIMES DAILY AS NEEDED FOR vertigo OR dizziness. (Patient not taking: Reported on 11/28/2022)   No facility-administered encounter medications on file as of 11/28/2022.    Allergies (verified) Erythromycin, Fentanyl, Heparin, Hydrocodone-acetaminophen, Meperidine hcl, Penicillins, Procaine hcl, Promethazine hcl, and Temazepam   History: Past Medical History:  Diagnosis Date   Abnormal uterine bleeding    Allergic rhinitis    Allergy    seasonal   Anxiety    panic attacks   Anxiety disorder    Asthma    Cataract    left eye surgery to removed  Cataract    right eye   Colorectal cancer ([facility]) 1997   T3, N0   Depression    pt states she isn't depressed anymore, continues paxil daily   Endometriosis    Fibroid    GERD (gastroesophageal reflux disease)    Hemorrhoids    Hyperlipidemia    diet controlled, no meds   Hypertension    PONV (postoperative nausea and vomiting)    Prediabetes    Seasonal allergies    SVD (spontaneous vaginal delivery)    x 3   Thyroid disease    Urinary incontinence     Past Surgical History:  Procedure Laterality Date   cataract surgery Left    [place] N/A 11/21/2015   Procedure: [place];  Surgeon: Salvadore Dom, MD;  Location: [facility];  Service: Gynecology;  Laterality: N/A;   DILATION AND CURETTAGE OF UTERUS     D & C   POLYPECTOMY     TONSILLECTOMY     Family History  Problem Relation Age of Onset   Sudden death Brother 35       Apparent DVT/PE. all males   Heart attack Brother    Varicose Veins Mother    Hypertension Father    Sudden death Cousin 37       Paternal cousin -dvt/pe 7 all males   Coronary artery disease Cousin 29       Paternal cousin, MI   Breast cancer Daughter 46   Stroke Maternal Grandmother    Stroke Maternal Grandfather    Early death Paternal Grandmother    Tuberculosis Paternal Grandmother    Early death Paternal Grandfather    Tuberculosis Paternal Grandfather    Hypertension Son    Colon cancer Neg Hx    Rectal cancer Neg Hx    Stomach cancer Neg Hx    Social History   Socioeconomic History   Marital status: Divorced    Spouse name: Not on file   Number of children: Not on file   Years of education: Not on file   Highest education level: Not on file  Occupational History   Occupation: part time  Tobacco Use   Smoking status: Never   Smokeless tobacco: Never  Vaping Use   Vaping Use: Never used  Substance and Sexual Activity   Alcohol use: Yes    Alcohol/week: 0.0 standard drinks of alcohol    Comment: occasional wine   Drug use: No   Sexual activity: Not Currently    Partners: Male    Birth control/protection: Post-menopausal  Other Topics Concern   Not on file  Social History Narrative   Divorced (ex husband later passed- they were close) Lives alone.  Two children- one daughter lives close. 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      Hobbies: painting- enjoys classes, writing, photography, sewing   Social Determinants of Health   Financial Resource Strain: Low Risk  (11/28/2022)   Overall Financial Resource Strain (CARDIA)    Difficulty of Paying Living Expenses: Not hard at all  Food Insecurity: No Food Insecurity (11/08/2021)   Hunger Vital Sign    Worried About  Running Out of Food in the Last Year: Never true    [place] in the Last Year: Never true  Transportation Needs: No Transportation Needs (11/28/2022)   PRAPARE - Hydrologist (Medical): No    Lack of Transportation (Non-Medical): No  Physical Activity: Insufficiently Active (11/28/2022)   Exercise Vital Sign    Days of Exercise per Week: 3 days    Minutes of Exercise per Session: 20 min  Stress: No Stress Concern Present (11/28/2022)   [place]    Feeling of Stress : Not at all  Social Connections: Moderately Integrated (11/28/2022)   Social Connection and Isolation Panel [NHANES]    Frequency of Communication with Friends and Family: More than three times a week    Frequency of Social Gatherings with Friends and Family: Twice a week    Attends Religious Services: More than 4 times per year    Active Member of [employer] or Organizations: Yes    Attends Archivist Meetings: More than 4 times per year    Marital Status: Widowed    Tobacco Counseling Counseling given: Not Answered   Clinical Intake:  Pre-visit preparation completed: Yes  Pain : No/denies pain     BMI - recorded: 28.49 Nutritional Status: BMI 25 -29 Overweight Nutritional Risks: None Diabetes: No  How often do you need to have someone help you when you read instructions, pamphlets, or  other written materials from your doctor or pharmacy?: 1 - Never  Diabetic?Nutrition Risk Assessment:  Has the patient had any N/V/D within the last 2 months?  No  Does the patient have any non-healing wounds?  No  Has the patient had any unintentional weight loss or weight gain?  No   Diabetes:  Is the patient diabetic?  No  If diabetic, was a CBG obtained today?  No  Did the patient bring in their glucometer from home?  No  How often do you monitor your CBG's? N/a .   Financial Strains and Diabetes Management:  Are you having any financial strains with the device, your supplies or your medication? No .  Does the patient want to be seen by Chronic Care Management for management of their diabetes?  No  Would the patient like to be referred to a Nutritionist or for Diabetic Management?  No   Diabetic Exams:  Diabetic Eye Exam: Overdue for diabetic eye exam. Pt has been advised about the importance in completing this exam. Patient advised to call and schedule an eye exam. Diabetic Foot Exam: Completed 05/22/22   Interpreter Needed?: No  Information entered by :: Charlott Rakes, LPN   Activities of Daily Living    11/28/2022   12:00 PM  In your present state of health, do you have any difficulty performing the following activities:  Hearing? 0  Vision? 0  Difficulty concentrating or making decisions? 0  Walking or climbing stairs? 0  Dressing or bathing? 0  Doing errands, shopping? 0  Preparing Food and eating ? N  Using the Toilet? N  In the past six months, have you accidently leaked urine? N  Do you have problems with loss of bowel control? N  Managing your Medications? N  Managing your Finances? N  Housekeeping or managing your Housekeeping? N    Patient Care Team: Marin Olp, MD as PCP - General (Family Medicine) Salvadore Dom, MD as Consulting Physician (Obstetrics and Gynecology) Cannon Kettle,  Lady Saucier, DPM as Consulting Physician (Podiatry) Burden,  Lincoln Brigham, MD as Consulting Physician (Ophthalmology) Druscilla Brownie, MD as Consulting Physician (Dermatology)  Indicate any recent Medical Services you may have received from other than [facility] providers in the past year (date may be approximate).     Assessment:   This is a routine wellness examination for Tammie [name].  Hearing/Vision screen Hearing Screening - Comments:: Pt denies any  hearing issues  Vision Screening - Comments:: Pt follows up with Dr Joya San for annual eye exams   Dietary issues and exercise activities discussed: Current Exercise Habits: Home exercise routine, Type of exercise: walking;Other - see comments, Time (Minutes): 20, Frequency (Times/Week): 3, Weekly Exercise (Minutes/Week): 60   Goals Addressed             This Visit's Progress    Patient Stated       To do exercise        Depression Screen    11/28/2022    2:52 PM 11/21/2022   12:59 PM 11/08/2021    8:12 AM 04/16/2021    3:48 PM 11/02/2020    2:06 PM 08/15/2020    3:55 PM 07/27/2019    3:25 PM  PHQ 2/9 Scores  PHQ - 2 Score 0 0 0 0 0 0 0  PHQ- 9 Score 0 0         Fall Risk    11/28/2022   12:00 PM 11/21/2022   12:59 PM 11/08/2021    8:17 AM 04/16/2021    3:51 PM 11/07/2020    2:21 PM  Fall Risk   Falls in the past year? 0 0 0 0 0  Number falls in past yr: 0 0 0  0  Injury with Fall?  0 0  0  Risk for fall due to : Impaired vision No Fall Risks Impaired vision    Follow up Falls prevention discussed Falls evaluation completed Falls prevention discussed      FALL RISK PREVENTION PERTAINING TO THE HOME:  Any stairs in or around the home? Yes  If so, are there any without handrails? No  Home free of loose throw rugs in walkways, pet beds, electrical cords, etc? Yes  Adequate lighting in your home to reduce risk of falls? Yes   ASSISTIVE DEVICES UTILIZED TO PREVENT FALLS:  Life alert? No  Use of a cane, walker or w/c? Yes  Grab bars in the bathroom? No  Shower chair or bench in shower? Yes   Elevated toilet seat or a handicapped toilet? No   TIMED UP AND GO:  Was the test performed? No .   Cognitive Function:        11/28/2022    2:55 PM 11/08/2021    8:21 AM 11/02/2020    2:20 PM 07/27/2019    3:26 PM  6CIT Screen  What Year? 0 points 0 points 0 points 0 points  What month? 0 points 0 points 0 points 0 points  What time? 0 points 0 points  0 points  Count back from 20 0 points 0 points 0 points 0 points  Months in reverse 0 points 0 points 0 points 0 points  Repeat phrase 0 points 0 points 0 points 0 points  Total Score 0 points 0 points  0 points    Immunizations Immunization History  Administered Date(s) Administered   Pneumococcal Conjugate-13 08/09/2014   Pneumococcal Polysaccharide-23 10/19/2015      Flu Vaccine status: Declined, Education has been provided regarding the importance of  this vaccine but patient still declined. Advised may receive this vaccine at local pharmacy or Health Dept. Aware to provide a copy of the vaccination record if obtained from local pharmacy or Health Dept. Verbalized acceptance and understanding.  Pneumococcal vaccine status: Up to date  Covid-19 vaccine status: Declined, Education has been provided regarding the importance of this vaccine but patient still declined. Advised may receive this vaccine at local pharmacy or Health Dept.or vaccine clinic. Aware to provide a copy of the vaccination record if obtained from local pharmacy or Health Dept. Verbalized acceptance and understanding.  Qualifies for Shingles Vaccine? Yes   Zostavax completed No   Shingrix Completed?: No.    Education has been provided regarding the importance of this vaccine. Patient has been advised to call insurance company to determine out of pocket expense if they have not yet received this vaccine. Advised may also receive vaccine at local pharmacy or Health Dept. Verbalized acceptance and understanding.  Screening Tests Health Maintenance  Topic  Date Due   OPHTHALMOLOGY EXAM  Never done   COVID-19 Vaccine (1) 12/07/2022 (Originally 03/09/1936)   Zoster Vaccines- Shingrix (1 of 2) 02/21/2023 (Originally 09[birth year]09/1987)   INFLUENZA VACCINE  08/07/2035 (Originally 04/16/2022)   FOOT EXAM  05/23/2023   HEMOGLOBIN A1C  05/24/2023   Diabetic kidney evaluation - eGFR measurement  11/21/2023   Diabetic kidney evaluation - Urine ACR  11/21/2023   Medicare Annual Wellness (AWV)  11/28/2023   Pneumonia Vaccine 76+ Years old  Completed   DEXA SCAN  Completed   HPV VACCINES  Aged Out   DTaP/Tdap/Td  Discontinued    Health Maintenance  Health Maintenance Due  Topic Date Due   OPHTHALMOLOGY EXAM  Never done    Colorectal cancer screening: No longer required.   Mammogram status: No longer required due to age.     Additional Screening:   Vision Screening: Recommended annual ophthalmology exams for early detection of glaucoma and other disorders of the eye. Is the patient up to date with their annual eye exam?  Yes  Who is the provider or what is the name of the office in which the patient attends annual eye exams? Dr Joya San  If pt is not established with a provider, would they like to be referred to a provider to establish care? No .   Dental Screening: Recommended annual dental exams for proper oral hygiene  Community Resource Referral / Chronic Care Management: CRR required this visit?  No   CCM required this visit?  No      Plan:     I have personally reviewed and noted the following in the patient's chart:   Medical and social history Use of alcohol, tobacco or illicit drugs  Current medications and supplements including opioid prescriptions. Patient is not currently taking opioid prescriptions. Functional ability and status Nutritional status Physical activity Advanced directives List of other physicians Hospitalizations, surgeries, and ER visits in previous 12 months Vitals Screenings to include cognitive,  depression, and falls Referrals and appointments  In addition, I have reviewed and discussed with patient certain preventive protocols, quality metrics, and best practice recommendations. A written personalized care plan for preventive services as well as general preventive health recommendations were provided to patient.     Willette Brace, LPN   [id number]   Nurse Notes: none

## 2022-12-03 ENCOUNTER — Telehealth: Payer: Self-pay | Admitting: Family Medicine

## 2022-12-03 ENCOUNTER — Telehealth: Payer: Self-pay

## 2022-12-03 MED ORDER — FLUTICASONE FUROATE-VILANTEROL 100-25 MCG/ACT IN AEPB: 1.0000 | INHALATION_SPRAY | Freq: Every day | RESPIRATORY_TRACT | 11 refills | Status: DC

## 2022-12-03 NOTE — Telephone Encounter (Signed)
I sent in Breo-stop Symbicort when she starts this

## 2022-12-03 NOTE — Telephone Encounter (Signed)
See below

## 2022-12-03 NOTE — Telephone Encounter (Signed)
Pt states pharmacy does not carry budesonide-formoterol (SYMBICORT) 160-4.5 MCG/ACT inhaler  Anymore, the new RX that needs to be sent that takes over that med is Breo-Ellipta - tier 3 - 100 or 200mg . Please advise.  [place], [place] Phone: [phone]  Fax: [phone]

## 2022-12-03 NOTE — Telephone Encounter (Signed)
Note sent to PA team

## 2022-12-04 LAB — HM DIABETES EYE EXAM

## 2022-12-05 ENCOUNTER — Other Ambulatory Visit (HOSPITAL_COMMUNITY): Payer: Self-pay

## 2022-12-05 NOTE — Telephone Encounter (Signed)
Symbicort discontinued. Changed to [facility]. Please advise if PA is still requested.

## 2022-12-11 MED ORDER — FLUTICASONE FUROATE-VILANTEROL 100-25 MCG/ACT IN AEPB: 1.0000 | INHALATION_SPRAY | Freq: Every day | RESPIRATORY_TRACT | 11 refills | Status: AC

## 2022-12-11 NOTE — Telephone Encounter (Signed)
Pharmacy updated and med sent in.

## 2022-12-11 NOTE — Telephone Encounter (Signed)
Patient states she no longer uses [facility] (removed from Preferred Pharmacy list).  Requests RX for  fluticasone furoate-vilanterol (BREO ELLIPTA) 100-25 MCG/ACT AEPB be sent to   [place], [place] Phone: [phone]  Fax: [phone]

## 2023-01-13 ENCOUNTER — Other Ambulatory Visit: Payer: Self-pay | Admitting: Family Medicine

## 2023-01-29 ENCOUNTER — Encounter: Payer: Self-pay | Admitting: Family Medicine

## 2023-01-29 ENCOUNTER — Ambulatory Visit (INDEPENDENT_AMBULATORY_CARE_PROVIDER_SITE_OTHER): Payer: PPO | Admitting: Family Medicine

## 2023-01-29 VITALS — BP 124/70 | HR 117 | Temp 98.3°F | Resp 18 | Ht 64.0 in | Wt 163.2 lb

## 2023-01-29 DIAGNOSIS — H6122 Impacted cerumen, left ear: Secondary | ICD-10-CM

## 2023-01-29 DIAGNOSIS — J0100 Acute maxillary sinusitis, unspecified: Secondary | ICD-10-CM | POA: Diagnosis not present

## 2023-01-29 MED ORDER — AZELASTINE HCL 0.1 % NA SOLN: 1.0000 | Freq: Two times a day (BID) | NASAL | 4 refills | Status: DC

## 2023-01-29 MED ORDER — CEFDINIR 300 MG PO CAPS: 300.0000 mg | ORAL_CAPSULE | Freq: Two times a day (BID) | ORAL | 0 refills | Status: DC

## 2023-01-29 NOTE — Progress Notes (Signed)
Subjective:     Patient ID: Tammie [name], female    DOB: [birth year], 88 y.o.   MRN: [id number]  Chief Complaint  Patient presents with   Cough    Productive cough that started 3 weeks ago   Nasal Congestion    Using Flonase    Sinusitis    Has a hx of sinus infections    HPI Productive cough for 3 weeks  chronic, constant nasal drip.  Flonase gives headache(s).  Gets yearly sinusitis.  Face/head hurts  clearing throat.  In general, "not feel good".  No fevers/chills.  No SHORTNESS OF BREATH.  Wheezes some.  Occasional uses inhaler.  Takes breo once/day-learned how to use it so just started.   Daughter getting married-patient stressed.    There are no preventive care reminders to display for this patient.  Past Medical History:  Diagnosis Date   Abnormal uterine bleeding    Allergic rhinitis    Allergy    seasonal   Anxiety    panic attacks   Anxiety disorder    Asthma    Cataract    left eye surgery to removed   Cataract    right eye   Colorectal cancer ([facility]) 1997   T3, N0   Depression    pt states she isn't depressed anymore, continues paxil daily   Endometriosis    Fibroid    GERD (gastroesophageal reflux disease)    Hemorrhoids    Hyperlipidemia    diet controlled, no meds   Hypertension    PONV (postoperative nausea and vomiting)    Prediabetes    Seasonal allergies    SVD (spontaneous vaginal delivery)    x 3   Thyroid disease    Urinary incontinence     Past Surgical History:  Procedure Laterality Date   cataract surgery Left    COLECTOMY  1997   SIGMOID COLECTOMY 1997   COLON SURGERY  1997   COLONOSCOPY     DILATATION & CURETTAGE/HYSTEROSCOPY WITH MYOSURE N/A 11/21/2015   Procedure: DILATATION & CURETTAGE/HYSTEROSCOPY WITH MYOSURE;  Surgeon: Romualdo Bolk, MD;  Location: [facility];  Service: Gynecology;  Laterality: N/A;   DILATION AND CURETTAGE OF UTERUS     D & C   POLYPECTOMY     TONSILLECTOMY       Current Outpatient  Medications:    albuterol (VENTOLIN HFA) 108 (90 Base) MCG/ACT inhaler, Inhale 2 puffs into the lungs every 6 (six) hours as needed for wheezing or shortness of breath., Disp: 18 g, Rfl: 2   amLODipine (NORVASC) 10 MG tablet, TAKE ONE TABLET ONCE DAILY, Disp: 90 tablet, Rfl: 3   augmented betamethasone dipropionate (DIPROLENE-AF) 0.05 % cream, , Disp: , Rfl:    azelastine (ASTELIN) 0.1 % nasal spray, Place 1 spray into both nostrils 2 (two) times daily. Use in each nostril as directed, Disp: 30 mL, Rfl: 4   cefdinir (OMNICEF) 300 MG capsule, Take 1 capsule (300 mg total) by mouth 2 (two) times daily., Disp: 14 capsule, Rfl: 0   clobetasol (TEMOVATE) 0.05 % external solution, Apply 1 application topically 2 (two) times daily. For 7-10 days up to twice a day maximum. Use only on scalp, Disp: 50 mL, Rfl: 0   desloratadine (CLARINEX) 5 MG tablet, Take by mouth., Disp: , Rfl:    fluticasone furoate-vilanterol (BREO ELLIPTA) 100-25 MCG/ACT AEPB, Inhale 1 puff into the lungs daily., Disp: 1 each, Rfl: 11   hydrocortisone (ANUSOL-HC) 2.5 % rectal cream, Place  1 application rectally 2 times daily as needed for hemorrhoids., Disp: 28.35 g, Rfl: 1   lisinopril-hydrochlorothiazide (ZESTORETIC) 20-25 MG tablet, TAKE ONE TABLET ONCE DAILY, Disp: 30 tablet, Rfl: 3   meclizine (ANTIVERT) 25 MG tablet, TAKE 1 TABLET BY MOUTH 2 TIMES DAILY AS NEEDED FOR vertigo OR dizziness., Disp: 30 tablet, Rfl: 0   omeprazole (PRILOSEC) 20 MG capsule, TAKE ONE CAPSULE ONCE DAILY, Disp: 90 capsule, Rfl: 3   PARoxetine (PAXIL) 20 MG tablet, Take 1 tablet (20 mg total) by mouth daily., Disp: 90 tablet, Rfl: 3  Allergies  Allergen Reactions   Erythromycin     unknown   Fentanyl Hives   Heparin     questionable, unknown   Hydrocodone-Acetaminophen Hives   Meperidine Hcl     unknown   Penicillins     Has patient had a PCN reaction causing immediate rash, facial/tongue/throat swelling, SOB or lightheadedness with hypotension:  no Has patient had a PCN reaction causing severe rash involving mucus membranes or skin necrosis: no Has patient had a PCN reaction that required hospitalization no Has patient had a PCN reaction occurring within the last 10 years: no If all of the above answers are "NO", then may proceed with Cephalosporin use.    Procaine Hcl     Could not sleep   Promethazine Hcl     unknown   Temazepam Hives    unknown   ROS neg/noncontributory except as noted HPI/below      Objective:     BP 124/70 (BP Location: Left Arm, Patient Position: Sitting, Cuff Size: Normal)   Pulse (!) 117   Temp 98.3 F (36.8 C) (Temporal)   Resp 18   Ht 5\' 4"  (1.626 m)   Wt 163 lb 4 oz (74 kg)   SpO2 97%   BMI 28.02 kg/m  Wt Readings from Last 3 Encounters:  01/29/23 163 lb 4 oz (74 kg)  11/28/22 166 lb (75.3 kg)  11/21/22 166 lb 12.8 oz (75.7 kg)    Physical Exam   Gen: WDWN NAD HEENT: NCAT, conjunctiva not injected, sclera nonicteric TM WNL right, left-wax, OP moist, no exudates  +TTP maxillary sinuses.  NECK:  supple, no thyromegaly, no nodes CARDIAC:tachy RRR, S1S2+ LUNGS: CTAB. No wheezes EXT:  no edema MSK: no gross abnormalities.  NEURO: A&O x3.  CN II-XII intact.  PSYCH: normal mood. Good eye contact     Assessment & Plan:  Acute non-recurrent maxillary sinusitis  Impacted cerumen of left ear  Other orders -     Azelastine HCl; Place 1 spray into both nostrils 2 (two) times daily. Use in each nostril as directed  Dispense: 30 mL; Refill: 4 -     Cefdinir; Take 1 capsule (300 mg total) by mouth 2 (two) times daily.  Dispense: 14 capsule; Refill: 0   Maxillary sinusitis- astelin twice daily as needed. Omnicef 300 mg twice daily.  Rhinocort/nasacort rather than Flonase Tachycardia-?nerves, Flonase, infection.  Will treat infection, change Flonase.  Monitor   Wax left ear-debrox  follow up next week(s) ear irrig   Angelena Sole, MD

## 2023-01-29 NOTE — Patient Instructions (Addendum)
Rhinocort or nasacort instead of Flonase.   Astelin 2 sprays 2x/day  if needed  Debrox-5 drops left ear daily for 5 days.

## 2023-02-07 ENCOUNTER — Encounter: Payer: Self-pay | Admitting: Internal Medicine

## 2023-02-07 ENCOUNTER — Ambulatory Visit (INDEPENDENT_AMBULATORY_CARE_PROVIDER_SITE_OTHER): Payer: PPO | Admitting: Internal Medicine

## 2023-02-07 VITALS — BP 136/62 | HR 50 | Temp 98.5°F | Ht 64.0 in | Wt 164.4 lb

## 2023-02-07 DIAGNOSIS — S70362A Insect bite (nonvenomous), left thigh, initial encounter: Secondary | ICD-10-CM | POA: Diagnosis not present

## 2023-02-07 DIAGNOSIS — H6122 Impacted cerumen, left ear: Secondary | ICD-10-CM | POA: Diagnosis not present

## 2023-02-07 DIAGNOSIS — H6690 Otitis media, unspecified, unspecified ear: Secondary | ICD-10-CM

## 2023-02-07 DIAGNOSIS — W57XXXA Bitten or stung by nonvenomous insect and other nonvenomous arthropods, initial encounter: Secondary | ICD-10-CM | POA: Diagnosis not present

## 2023-02-07 DIAGNOSIS — H60392 Other infective otitis externa, left ear: Secondary | ICD-10-CM

## 2023-02-07 DIAGNOSIS — T162XXA Foreign body in left ear, initial encounter: Secondary | ICD-10-CM | POA: Diagnosis not present

## 2023-02-07 MED ORDER — DOXYCYCLINE HYCLATE 100 MG PO TABS
100.0000 mg | ORAL_TABLET | Freq: Two times a day (BID) | ORAL | 0 refills | Status: DC
Start: 2023-02-07 — End: 2023-05-29

## 2023-02-07 NOTE — Progress Notes (Signed)
[facility]: [phone]   Routine Medical Office Visit  Patient:  Tammie [name]      Age: 88 y.o.       Sex:  female  Date:   02/07/2023 PCP:    Shelva Majestic, MD   Today's Healthcare Provider: Lula Olszewski, MD   Assessment and Plan:   This was an interesting visit where she came in just for ear to be washed out on left, but tick was discovered on thigh AND foreign body discovered in left ear- both issues addressed.  Foreign body of left ear, initial encounter -     Ear Cerumen Removal  Tick bite of thigh, left, initial encounter Assessment & Plan: Showed pictures of erythema migrans Put info about lyme in MyChart. Provide doxycycline for this as well as I was concerned about possible external auditory canal abrasions during foreign body removal procedure.  Orders: -     Doxycycline Hyclate; Take 1 tablet (100 mg total) by mouth 2 (two) times daily.  Dispense: 14 tablet; Refill: 0  Other infective acute otitis externa of left ear -     Doxycycline Hyclate; Take 1 tablet (100 mg total) by mouth 2 (two) times daily.  Dispense: 14 tablet; Refill: 0  Impacted cerumen of left ear -     Ear Cerumen Removal   The tick rash and removed tick were slightly suspicious for the deer tick and maybe lyme's disease, but also she had extended instrumentation of left ear with couple of minor pains during the procedure so I erred on side of providing antibiotics for both possibilities.   Ear Foreign Body & Ear Cerumen Removal  Date/Time: 02/07/2023 3:00 PM  Performed by: Lula Olszewski, MD Authorized by: Lula Olszewski, MD   Anesthesia: Local Anesthetic: none Ceruminolytics applied: Ceruminolytics applied prior to the procedure. Location details: left ear Patient tolerance: patient tolerated the procedure well with no immediate complications Comments: PROCEDURE: CERUMEN DISIMPACTION and REMOVAL of FOREIGN BODY LEFT external auditory  canal   Otoscopic viewing of the tympanic membrane was initially obstructed by moderate impacted cerumen and a foreign body that later was determined to be tissue paper, in the left external auditory canal, so disimpaction by irrigation and curettage (after failure of irrigation) was recommended.  The associated risk of tympanic membrane perforation was discussed and verbal consent was obtained prior to performing the procedure. The affected  left  auditory canal(s) were then irrigated by gentle ear lavage.   Unfortunately, cerumen that was extremely hard, dry, irritating and adherent to the external auditory canal could not be dislodged by the irrigation, and threatened to cause symptoms such as pain, itching and/or hearing loss.  This situation required an MD's skill to remove with magnification and instrumentation.  The still affected left auditory canal(s) were then mechanically disimpacted by Lula Olszewski using magnified video-guided otoscopic curettage, taking great care to minimize pain.   Removal of the impacted cerumen was confirmed by real-time video otoscopy during curettage.   Post procedurally,  the tympanic membranes was re-examined to confirm it was still intact.  At this time, the external auditory canal(s) were normal.   Patient tolerated well without significant complications- a couple of mild tweaks of pain during extended instrumentation of 5 minutes due to difficulty getting the foreign body out.    The patient reported complete resolution of symptoms after the procedure.  The patient reported satisfaction with the procedure as they were able to watch video  of the performance and see the removed piece of tissue paper.  Procedure type: curette  Sedation: Patient sedated: no       Treatment plan discussed and reviewed in detail. Explained medication safety and potential side effects. Agreed on patient returning to office if symptoms worsen, persist, or new symptoms develop.  Discussed precautions in case of needing to visit the Emergency Department. Answered all patient questions and confirmed understanding and comfort with the plan. Encouraged patient to contact our office if they have any questions or concerns.       Clinical Presentation:    88 y.o. female here today for Ear Fullness (Pt in the office for ear fullness and to have ears looked at and cleaned if needed; ) and Tick Removal (Pt had tick present on right upper leg and removed for patient, wants provider to look at it for her)  HPI   The following table summarizes the actions taken during the encounter to manage (by editing,updating, adding,and resolving)  Problem  Tick Bite of Thigh, Left, Initial Encounter   This tiny tick was removed from the left thigh, and this 1-2 cm erythematous area was present. Photographs Taken 02/07/2023 :             Reviewed chart data: Active Ambulatory Problems    Diagnosis Date Noted   GERD 06/22/2009   Hypertension    Panic attacks    History of colorectal cancer    Allergic rhinitis    Asthma    Systolic murmur 06/21/2015   Osteopenia 10/02/2015   Left leg pain 12/20/2016   Hemorrhoids 12/20/2016   Dermatitis 12/20/2016   Diabetes mellitus without complication (HCC) 07/15/2017   Vertigo 07/15/2017   Hyperlipidemia 01/15/2018   Venous insufficiency 11/15/2021   Tick bite of thigh, left, initial encounter 02/07/2023   Resolved Ambulatory Problems    Diagnosis Date Noted   DYSPHAGIA 06/22/2009   PERSONAL HX COLON CANCER 06/22/2009   Prediabetes    Past Medical History:  Diagnosis Date   Abnormal uterine bleeding    Allergy    Anxiety    Anxiety disorder    Cataract    Cataract    Colorectal cancer (HCC) 1997   Depression    Endometriosis    Fibroid    GERD (gastroesophageal reflux disease)    PONV (postoperative nausea and vomiting)    Seasonal allergies    SVD (spontaneous vaginal delivery)    Thyroid disease    Urinary  incontinence     Outpatient Medications Prior to Visit  Medication Sig   albuterol (VENTOLIN HFA) 108 (90 Base) MCG/ACT inhaler Inhale 2 puffs into the lungs every 6 (six) hours as needed for wheezing or shortness of breath.   amLODipine (NORVASC) 10 MG tablet TAKE ONE TABLET ONCE DAILY   augmented betamethasone dipropionate (DIPROLENE-AF) 0.05 % cream    azelastine (ASTELIN) 0.1 % nasal spray Place 1 spray into both nostrils 2 (two) times daily. Use in each nostril as directed   cefdinir (OMNICEF) 300 MG capsule Take 1 capsule (300 mg total) by mouth 2 (two) times daily.   clobetasol (TEMOVATE) 0.05 % external solution Apply 1 application topically 2 (two) times daily. For 7-10 days up to twice a day maximum. Use only on scalp   desloratadine (CLARINEX) 5 MG tablet Take by mouth.   fluticasone furoate-vilanterol (BREO ELLIPTA) 100-25 MCG/ACT AEPB Inhale 1 puff into the lungs daily.   hydrocortisone (ANUSOL-HC) 2.5 % rectal cream Place 1 application rectally 2  times daily as needed for hemorrhoids.   lisinopril-hydrochlorothiazide (ZESTORETIC) 20-25 MG tablet TAKE ONE TABLET ONCE DAILY   meclizine (ANTIVERT) 25 MG tablet TAKE 1 TABLET BY MOUTH 2 TIMES DAILY AS NEEDED FOR vertigo OR dizziness.   omeprazole (PRILOSEC) 20 MG capsule TAKE ONE CAPSULE ONCE DAILY   PARoxetine (PAXIL) 20 MG tablet Take 1 tablet (20 mg total) by mouth daily.   No facility-administered medications prior to visit.         Clinical Data Analysis:   Physical Exam  BP 136/62 (BP Location: Left Arm)   Pulse (!) 50   Temp 98.5 F (36.9 C) (Temporal)   Ht 5\' 4"  (1.626 m)   Wt 164 lb 6.4 oz (74.6 kg)   SpO2 98%   BMI 28.22 kg/m  Wt Readings from Last 10 Encounters:  02/07/23 164 lb 6.4 oz (74.6 kg)  01/29/23 163 lb 4 oz (74 kg)  11/28/22 166 lb (75.3 kg)  11/21/22 166 lb 12.8 oz (75.7 kg)  05/22/22 162 lb 3.2 oz (73.6 kg)  11/15/21 175 lb 9.6 oz (79.7 kg)  10/26/21 176 lb (79.8 kg)  10/26/21 175 lb 3.2 oz  (79.5 kg)  04/16/21 172 lb 9.6 oz (78.3 kg)  11/07/20 173 lb 6.4 oz (78.7 kg)   Vital signs reviewed.  Nursing notes reviewed. Weight trend reviewed. Abnormalities and Problem-Specific physical exam findings:  see procedure notes for ear, see problem overview for images of abnormal findings of tick bite. General Appearance:  No acute distress appreciable.   Well-groomed, healthy-appearing female.  Well proportioned with no abnormal fat distribution.  Good muscle tone. Skin: Clear and well-hydrated. Pulmonary:  Normal work of breathing at rest, no respiratory distress apparent. SpO2: 98 %  Musculoskeletal: All extremities are intact.  Neurological:  Awake, alert, oriented, and engaged.  No obvious focal neurological deficits or cognitive impairments.  Sensorium seems unclouded.   Speech is clear and coherent with logical content. Psychiatric:  Appropriate mood, pleasant and cooperative demeanor, thoughtful and engaged during the exam  Results Reviewed:    No results found for any visits on 02/07/23.  Recent Results (from the past 2160 hour(s))  Microalbumin / creatinine urine ratio     Status: Abnormal   Collection Time: 11/21/22  1:36 PM  Result Value Ref Range   Microalb, Ur 6.6 (H) 0.0 - 1.9 mg/dL   Creatinine,U 16.1 mg/dL   Microalb Creat Ratio 6.7 0.0 - 30.0 mg/g  Comprehensive metabolic panel     Status: Abnormal   Collection Time: 11/21/22  1:36 PM  Result Value Ref Range   Sodium 139 135 - 145 mEq/L   Potassium 3.8 3.5 - 5.1 mEq/L   Chloride 99 96 - 112 mEq/L   CO2 33 (H) 19 - 32 mEq/L   Glucose, Bld 131 (H) 70 - 99 mg/dL   BUN 12 6 - 23 mg/dL   Creatinine, Ser 0.96 0.40 - 1.20 mg/dL   Total Bilirubin 0.6 0.2 - 1.2 mg/dL   Alkaline Phosphatase 84 39 - 117 U/L   AST 20 0 - 37 U/L   ALT 14 0 - 35 U/L   Total Protein 7.3 6.0 - 8.3 g/dL   Albumin 4.1 3.5 - 5.2 g/dL   GFR 04.54 >09.81 mL/min    Comment: Calculated using the CKD-EPI Creatinine Equation (2021)   Calcium 10.6  (H) 8.4 - 10.5 mg/dL  Hemoglobin X9J     Status: None   Collection Time: 11/21/22  1:36 PM  Result  Value Ref Range   Hgb A1c MFr Bld 6.5 4.6 - 6.5 %    Comment: Glycemic Control Guidelines for People with Diabetes:Non Diabetic:  <6%Goal of Therapy: <7%Additional Action Suggested:  >8%   HM DIABETES EYE EXAM     Status: None   Collection Time: 12/04/22 12:00 AM  Result Value Ref Range   HM Diabetic Eye Exam No Retinopathy No Retinopathy    No image results found.   No results found.     Signed: Lula Olszewski, MD 02/08/2023 4:26 PM

## 2023-02-08 NOTE — Patient Instructions (Signed)
It was a pleasure seeing you today! Your health and satisfaction are our top priorities.   Glenetta Hew, MD  Next Steps:  [x]  Flexible Follow-Up: We recommend a follow up with your primary care, a specialist, or me within 1-2 weeks for ensuring your problem resolves. This allows for progress monitoring and treatment adjustments. [x]  Early Intervention: Schedule sooner appointment, call our on-call services, or go to emergency room if there is Increase in pain or discomfort New or worsening symptoms Sudden or severe changes in your health [x]  Lab & X-ray Appointments: complete or schedule to complete today, or call to schedule.  X-rays: [facility] (M-F, 8:30am-noon or 1pm-5pm).  Making the Most of Our Focused (20 minute) Follow Up Appointments:  [x]   Clearly state your top concerns at the beginning of the visit to focus our discussion [x]   If you anticipate you will need more time, please inform the front desk during scheduling - we can book multiple appointments in the same week. [x]   If you have transportation problems- use our convenient video appointments or ask about transportation support. [x]   We can get down to business faster if you use MyChart to update information before the visit and submit non-urgent questions before your visit. Thank you for taking the time to provide details through MyChart.  Let our nurse know and she can import this information into your encounter documents.  Arrival and Wait Times: [x]   Arriving on time ensures that everyone receives prompt attention. [x]   Early morning (8a) and afternoon (1p) appointments tend to have shortest wait times. [x]   Unfortunately, we cannot delay appointments for late arrivals or hold slots during phone calls.  Getting Answers and Following Up  [x]   Simple Questions & Concerns: For quick questions or basic follow-up after your visit, reach [place] at [phone] or MyChart messaging. [x]   Complex Concerns: If  your concern is more complex, scheduling an appointment might be best. Discuss this with the staff to find the most suitable option. [x]   Lab & Imaging Results: We'll contact you directly if results are abnormal or you don't use MyChart. Most normal results will be on MyChart within 2-3 business days, with a review message from Dr. Jon Billings. Haven't heard back in 2 weeks? Need results sooner? Contact us at [phone]. [x]   Referrals: Our referral coordinator will manage specialist referrals. The specialist's office should contact you within 2 weeks to schedule an appointment. Call us if you haven't heard from them after 2 weeks.  Staying Connected  [x]   MyChart: Activate your MyChart for the fastest way to access results and message [place]. See the last page of this paperwork for instructions on how to activate.  Bring to Your Next Appointment  [x]   Medications: Please bring all your medication bottles to your next appointment to ensure we have an accurate record of your prescriptions. [x]   Health Diaries: If you're monitoring any health conditions at home, keeping a diary of your readings can be very helpful for discussions at your next appointment.  Billing  [x]   X-ray & Lab Orders: These are billed by separate companies. Contact the invoicing company directly for questions or concerns. [x]   Visit Charges: Discuss any billing inquiries with our administrative services team.  Your Satisfaction Matters  [x]   Share Your Experience: We strive for your satisfaction! If you have any complaints, or preferably compliments, please let Dr. Jon Billings know directly or contact our Practice Administrators, Edwena Felty or [employer], by  asking at the front desk.   Reviewing Your Records  [x]   Review this early draft of your clinical encounter notes below and the final encounter summary tomorrow on MyChart after its been completed.   Foreign body of left ear, initial encounter -     Ear Cerumen  Removal  Tick bite of thigh, left, initial encounter Assessment & Plan: Showed pictures of erythema migrans Put info about lyme in MyChart. Provide doxycycline for this as well as I was concerned about possible external auditory canal abrasions during foreign body removal procedure.  Orders: -     Doxycycline Hyclate; Take 1 tablet (100 mg total) by mouth 2 (two) times daily.  Dispense: 14 tablet; Refill: 0  Other infective acute otitis externa of left ear -     Doxycycline Hyclate; Take 1 tablet (100 mg total) by mouth 2 (two) times daily.  Dispense: 14 tablet; Refill: 0  Impacted cerumen of left ear -     Ear Cerumen Removal

## 2023-02-08 NOTE — Assessment & Plan Note (Signed)
Showed pictures of erythema migrans Put info about lyme in MyChart. Provide doxycycline for this as well as I was concerned about possible external auditory canal abrasions during foreign body removal procedure.

## 2023-05-01 ENCOUNTER — Encounter (INDEPENDENT_AMBULATORY_CARE_PROVIDER_SITE_OTHER): Payer: Self-pay

## 2023-05-05 ENCOUNTER — Other Ambulatory Visit: Payer: Self-pay | Admitting: Family Medicine

## 2023-05-29 ENCOUNTER — Ambulatory Visit (INDEPENDENT_AMBULATORY_CARE_PROVIDER_SITE_OTHER): Payer: PPO | Admitting: Family Medicine

## 2023-05-29 ENCOUNTER — Encounter: Payer: Self-pay | Admitting: Family Medicine

## 2023-05-29 VITALS — BP 138/60 | HR 56 | Temp 98.3°F | Ht 64.0 in | Wt 166.8 lb

## 2023-05-29 DIAGNOSIS — M5432 Sciatica, left side: Secondary | ICD-10-CM

## 2023-05-29 DIAGNOSIS — I10 Essential (primary) hypertension: Secondary | ICD-10-CM | POA: Diagnosis not present

## 2023-05-29 DIAGNOSIS — Z0001 Encounter for general adult medical examination with abnormal findings: Secondary | ICD-10-CM | POA: Diagnosis not present

## 2023-05-29 DIAGNOSIS — G629 Polyneuropathy, unspecified: Secondary | ICD-10-CM | POA: Diagnosis not present

## 2023-05-29 DIAGNOSIS — Z79899 Other long term (current) drug therapy: Secondary | ICD-10-CM | POA: Diagnosis not present

## 2023-05-29 DIAGNOSIS — Z0000 Encounter for general adult medical examination without abnormal findings: Secondary | ICD-10-CM

## 2023-05-29 DIAGNOSIS — R739 Hyperglycemia, unspecified: Secondary | ICD-10-CM

## 2023-05-29 DIAGNOSIS — E785 Hyperlipidemia, unspecified: Secondary | ICD-10-CM

## 2023-05-29 DIAGNOSIS — E119 Type 2 diabetes mellitus without complications: Secondary | ICD-10-CM

## 2023-05-29 DIAGNOSIS — Z131 Encounter for screening for diabetes mellitus: Secondary | ICD-10-CM

## 2023-05-29 DIAGNOSIS — M85851 Other specified disorders of bone density and structure, right thigh: Secondary | ICD-10-CM | POA: Diagnosis not present

## 2023-05-29 MED ORDER — MECLIZINE HCL 25 MG PO TABS: 25.0000 mg | ORAL_TABLET | Freq: Two times a day (BID) | ORAL | 0 refills | Status: DC | PRN

## 2023-05-29 MED ORDER — LISINOPRIL 40 MG PO TABS: 40.0000 mg | ORAL_TABLET | Freq: Every day | ORAL | 3 refills | Status: DC

## 2023-05-29 NOTE — Assessment & Plan Note (Signed)
#  hypertension with history of orthostatic issues and blood pressure goal less than 150/90 S: medication: Amlodipine 10 mg, lisinopril hydrochlorothiazide 20-25 mg -feels urinates fair amount when takes this at night- also urinates most of the day if takes this in the daytime and very frustrating- she would like to change and come off the hydrochlorothiazide  -another possible benefit is calcium may come down which has been mildly high Home readings #s: no recent checks   -already low salt BP Readings from Last 3 Encounters:  05/29/23 138/60  02/07/23 136/62  01/29/23 124/70   A/P: blood pressure is well controlled today but she would like to trial off hydrochlorothiazide -she will continue amlodipine 10 mg - she will stop lisinopril hydrochlorothiazide 20-25 mg combo pill -she will start lisinopril 40 mg - she will follow up with me in office in 2 months - if blood pressure high at home after a few weeks should call sooner- ideal goal <140/90 but we typically tolerate up to 150/90 in her with lightheaded issues in past

## 2023-05-29 NOTE — Progress Notes (Signed)
Phone [phone] In person visit   Subjective:   Tammie [name] is a 88 y.o. year old very pleasant female patient who presents for/with See problem oriented charting for hypertension   Past Medical History-  Patient Active Problem List   Diagnosis Date Noted   Diabetes mellitus without complication (HCC) 07/15/2017    Priority: High   Osteopenia 10/02/2015    Priority: High   Venous insufficiency 11/15/2021    Priority: Medium    Hyperlipidemia 01/15/2018    Priority: Medium    Vertigo 07/15/2017    Priority: Medium    Hypertension     Priority: Medium    Panic attacks     Priority: Medium    History of colorectal cancer     Priority: Medium    Asthma     Priority: Medium    Left leg pain 12/20/2016    Priority: Low   Hemorrhoids 12/20/2016    Priority: Low   Dermatitis 12/20/2016    Priority: Low   Systolic murmur 06/21/2015    Priority: Low   Allergic rhinitis     Priority: Low   GERD 06/22/2009    Priority: Low   Tick bite of thigh, left, initial encounter 02/07/2023    Medications- reviewed and updated Current Outpatient Medications  Medication Sig Dispense Refill   albuterol (VENTOLIN HFA) 108 (90 Base) MCG/ACT inhaler Inhale 2 puffs into the lungs every 6 (six) hours as needed for wheezing or shortness of breath. 18 g 2   amLODipine (NORVASC) 10 MG tablet TAKE ONE TABLET ONCE DAILY 90 tablet 3   augmented betamethasone dipropionate (DIPROLENE-AF) 0.05 % cream      azelastine (ASTELIN) 0.1 % nasal spray Place 1 spray into both nostrils 2 (two) times daily. Use in each nostril as directed 30 mL 4   clobetasol (TEMOVATE) 0.05 % external solution Apply 1 application topically 2 (two) times daily. For 7-10 days up to twice a day maximum. Use only on scalp 50 mL 0   desloratadine (CLARINEX) 5 MG tablet Take by mouth.     fluticasone furoate-vilanterol (BREO ELLIPTA) 100-25 MCG/ACT AEPB Inhale 1 puff into the lungs daily. 1 each 11   hydrocortisone  (ANUSOL-HC) 2.5 % rectal cream Place 1 application rectally 2 times daily as needed for hemorrhoids. 28.35 g 1   lisinopril (ZESTRIL) 40 MG tablet Take 1 tablet (40 mg total) by mouth daily. 90 tablet 3   meclizine (ANTIVERT) 25 MG tablet TAKE 1 TABLET BY MOUTH 2 TIMES DAILY AS NEEDED FOR vertigo OR dizziness. 30 tablet 0   omeprazole (PRILOSEC) 20 MG capsule TAKE ONE CAPSULE ONCE DAILY 90 capsule 3   PARoxetine (PAXIL) 20 MG tablet Take 1 tablet (20 mg total) by mouth daily. 90 tablet 3   No current facility-administered medications for this visit.     Objective:  BP 138/60   Pulse (!) 56   Temp 98.3 F (36.8 C) (Temporal)   Ht 5\' 4"  (1.626 m)   Wt 166 lb 12.8 oz (75.7 kg)   SpO2 98%   BMI 28.63 kg/m      Assessment and Plan   Hypertension #hypertension with history of orthostatic issues and blood pressure goal less than 150/90 S: medication: Amlodipine 10 mg, lisinopril hydrochlorothiazide 20-25 mg -feels urinates fair amount when takes this at night- also urinates most of the day if takes this in the daytime and very frustrating- she would like to change and come off the hydrochlorothiazide  -another possible  benefit is calcium may come down which has been mildly high Home readings #s: no recent checks   -already low salt BP Readings from Last 3 Encounters:  05/29/23 138/60  02/07/23 136/62  01/29/23 124/70   A/P: blood pressure is well controlled today but she would like to trial off hydrochlorothiazide -she will continue amlodipine 10 mg - she will stop lisinopril hydrochlorothiazide 20-25 mg combo pill -she will start lisinopril 40 mg - she will follow up with me in office in 2 months - if blood pressure high at home after a few weeks should call sooner- ideal goal <140/90 but we typically tolerate up to 150/90 in her with lightheaded issues in past  Recommended follow up: Return in about 2 months (around 07/29/2023) for followup or sooner if needed.Schedule b4 you  leave. Future Appointments  Date Time Provider Department Center  12/02/2023  2:00 PM [facility] ANNUAL WELLNESS VISIT 1 [facility]   Lab/Order associations:   ICD-10-CM   1. Primary hypertension  I10       Meds ordered this encounter  Medications   lisinopril (ZESTRIL) 40 MG tablet    Sig: Take 1 tablet (40 mg total) by mouth daily.    Dispense:  90 tablet    Refill:  3    Return precautions advised.  Tana Conch, MD

## 2023-05-29 NOTE — Progress Notes (Signed)
Phone [phone]   Subjective:  Patient presents today for their annual physical. Chief complaint-noted.   See problem oriented charting- ROS- full  review of systems was completed and negative except for: numbness both feet, pain in back and into left leg at times  The following were reviewed and entered/updated in epic: Past Medical History:  Diagnosis Date   Abnormal uterine bleeding    Allergic rhinitis    Allergy    seasonal   Anxiety    panic attacks   Anxiety disorder    Asthma    Cataract    left eye surgery to removed   Cataract    right eye   Colorectal cancer (HCC) 1997   T3, N0   Depression    pt states she isn't depressed anymore, continues paxil daily   Endometriosis    Fibroid    GERD (gastroesophageal reflux disease)    Hemorrhoids    Hyperlipidemia    diet controlled, no meds   Hypertension    PONV (postoperative nausea and vomiting)    Prediabetes    Seasonal allergies    SVD (spontaneous vaginal delivery)    x 3   Thyroid disease    Urinary incontinence    Patient Active Problem List   Diagnosis Date Noted   Diabetes mellitus without complication (HCC) 07/15/2017    Priority: High   Osteopenia 10/02/2015    Priority: High   Venous insufficiency 11/15/2021    Priority: Medium    Hyperlipidemia 01/15/2018    Priority: Medium    Vertigo 07/15/2017    Priority: Medium    Hypertension     Priority: Medium    Panic attacks     Priority: Medium    History of colorectal cancer     Priority: Medium    Asthma     Priority: Medium    Left leg pain 12/20/2016    Priority: Low   Hemorrhoids 12/20/2016    Priority: Low   Dermatitis 12/20/2016    Priority: Low   Systolic murmur 06/21/2015    Priority: Low   Allergic rhinitis     Priority: Low   GERD 06/22/2009    Priority: Low   Tick bite of thigh, left, initial encounter 02/07/2023   Past Surgical History:  Procedure Laterality Date   cataract surgery Left    COLECTOMY  1997    SIGMOID COLECTOMY 1997   COLON SURGERY  1997   COLONOSCOPY     DILATATION & CURETTAGE/HYSTEROSCOPY WITH MYOSURE N/A 11/21/2015   Procedure: DILATATION & CURETTAGE/HYSTEROSCOPY WITH MYOSURE;  Surgeon: Romualdo Bolk, MD;  Location: [facility];  Service: Gynecology;  Laterality: N/A;   DILATION AND CURETTAGE OF UTERUS     D & C   POLYPECTOMY     TONSILLECTOMY      Family History  Problem Relation Age of Onset   Sudden death Brother 23       Apparent DVT/PE. all males   Heart attack Brother    Varicose Veins Mother    Hypertension Father    Sudden death Cousin 64       Paternal cousin -dvt/pe 7 all males   Coronary artery disease Cousin 72       Paternal cousin, MI   Breast cancer Daughter 8   Stroke Maternal Grandmother    Stroke Maternal Grandfather    Early death Paternal Grandmother    Tuberculosis Paternal Grandmother    Early death Paternal Grandfather    Tuberculosis Paternal  Grandfather    Hypertension Son    Colon cancer Neg Hx    Rectal cancer Neg Hx    Stomach cancer Neg Hx     Medications- reviewed and updated Current Outpatient Medications  Medication Sig Dispense Refill   albuterol (VENTOLIN HFA) 108 (90 Base) MCG/ACT inhaler Inhale 2 puffs into the lungs every 6 (six) hours as needed for wheezing or shortness of breath. 18 g 2   amLODipine (NORVASC) 10 MG tablet TAKE ONE TABLET ONCE DAILY 90 tablet 3   augmented betamethasone dipropionate (DIPROLENE-AF) 0.05 % cream      azelastine (ASTELIN) 0.1 % nasal spray Place 1 spray into both nostrils 2 (two) times daily. Use in each nostril as directed 30 mL 4   clobetasol (TEMOVATE) 0.05 % external solution Apply 1 application topically 2 (two) times daily. For 7-10 days up to twice a day maximum. Use only on scalp 50 mL 0   desloratadine (CLARINEX) 5 MG tablet Take by mouth.     fluticasone furoate-vilanterol (BREO ELLIPTA) 100-25 MCG/ACT AEPB Inhale 1 puff into the lungs daily. 1 each 11   hydrocortisone (ANUSOL-HC)  2.5 % rectal cream Place 1 application rectally 2 times daily as needed for hemorrhoids. 28.35 g 1   lisinopril (ZESTRIL) 40 MG tablet Take 1 tablet (40 mg total) by mouth daily. 90 tablet 3   meclizine (ANTIVERT) 25 MG tablet TAKE 1 TABLET BY MOUTH 2 TIMES DAILY AS NEEDED FOR vertigo OR dizziness. 30 tablet 0   omeprazole (PRILOSEC) 20 MG capsule TAKE ONE CAPSULE ONCE DAILY 90 capsule 3   PARoxetine (PAXIL) 20 MG tablet Take 1 tablet (20 mg total) by mouth daily. 90 tablet 3   No current facility-administered medications for this visit.    Allergies-reviewed and updated Allergies  Allergen Reactions   Erythromycin     unknown   Fentanyl Hives   Heparin     questionable, unknown   Hydrocodone-Acetaminophen Hives   Meperidine Hcl     unknown   Penicillins     Has patient had a PCN reaction causing immediate rash, facial/tongue/throat swelling, SOB or lightheadedness with hypotension: no Has patient had a PCN reaction causing severe rash involving mucus membranes or skin necrosis: no Has patient had a PCN reaction that required hospitalization no Has patient had a PCN reaction occurring within the last 10 years: no If all of the above answers are "NO", then may proceed with Cephalosporin use.    Procaine Hcl     Could not sleep   Promethazine Hcl     unknown   Temazepam Hives    unknown    Social History   Social History Narrative   Divorced (ex husband later passed- they were close) Lives alone.  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 Oropharynx normal Neck: no thyromegaly CV: RRR no murmurs rubs or gallops Lungs: CTAB no crackles, wheeze, rhonchi Abdomen: soft/nontender/nondistended/normal bowel sounds. No rebound or guarding.  Ext: trace to 1+ edema Skin: warm, dry Neuro: grossly normal, moves all extremities, PERRLA    Diabetic Foot Exam - Simple   Simple Foot Form Diabetic Foot exam was performed with the following findings: Yes 05/29/2023  2:32 PM  Visual Inspection No deformities, no ulcerations, no other skin breakdown bilaterally: Yes Sensation Testing Intact to touch and monofilament testing bilaterally: Yes Pulse Check Posterior Tibialis and Dorsalis pulse intact bilaterally: Yes Comments        Assessment and Plan   88 y.o. female presenting for annual physical.  Health Maintenance counseling: 1. Anticipatory guidance: Patient counseled regarding regular dental exams -q6 months, eye exams - yearly,  avoiding smoking and second hand smoke , limiting alcohol to 1 beverage per day very rare white wine , no illicit drugs .   2. Risk factor reduction:  Advised patient of need for regular exercise and diet rich and fruits and vegetables to reduce risk of heart attack and stroke.  Exercise-continue to stay active in the yard.  Diet/weight management-up 2 pounds in the last year but was down 15 pounds last year- encouraged mild weight loss.  Wt Readings from Last 3 Encounters:  05/29/23 166 lb 12.8 oz (75.7 kg)  02/07/23 164 lb 6.4 oz (74.6 kg)  01/29/23 163 lb 4 oz (74 kg)  3. Immunizations/screenings/ancillary studies declines flu, COVID, Shingrix, Tetanus, Diphtheria, and Pertussis (Tdap) but may get Tetanus, Diphtheria, and Pertussis (Tdap) and shingrix at pharmacy Immunization History  Administered Date(s) Administered   Pneumococcal  Conjugate-13 08/09/2014   Pneumococcal Polysaccharide-23 10/19/2015   4. Cervical cancer screening- past age based screening recommendations  5. Breast cancer screening-  breast exam declines and mammogram-past age based screening recommendations  6. Colon cancer screening - History of colorectal cancer status post surgical removal and chemotherapy-last colonoscopy January 2016 and reports was told needed no further colonoscopy. No blood in stool or melena   7. Skin cancer screening- had updated derm visit and was encouraging within last year.  advised regular sunscreen use. Denies worrisome, changing, or new skin lesions.  8. Birth control/STD check- not dating and not sexually active  9. Osteoporosis screening at 65- see below 10. Smoking associated screening - never smoker  Status of chronic or acute concerns   # Social update-grandbaby number 3 was born to her now 46 year old son and [place]- they have 2 children  # Diabetes-New diagnosis 11/29/2021 with A1c 7.1 S: Medication:Diet controlled Lab Results  Component Value Date   HGBA1C 6.5 11/21/2022   HGBA1C 6.2 06/05/2022   HGBA1C 7.1 (H) 11/29/2021  A/P: hopefully stable- update a1c today. Continue without meds for now   #hypertension - see separate note from today    #hyperlipidemia-have opted to not start medication for primary prevention over age 55 even with diabetes  S: Medication: None Lab Results  Component Value Date   CHOL 160 06/05/2022   HDL 33.70 (L) 06/05/2022   LDLCALC 104 (H) 06/05/2022   LDLDIRECT 115.0 10/26/2019   TRIG 111.0 06/05/2022   CHOLHDL 5 06/05/2022   A/P: update lipids- we are hoping not worsening- could consider even just once a week cholesterol medicine  #Panic attacks-have been well controlled on Paxil 20 mg - only on half tablet though -SI: none   # Asthma- breo only as needed, not needing albuterol  #  Low Bone density (formerly osteopenia) S: Last DEXA: Worst T score -2.4 on  09/27/2015 and right femoral neck  Calcium: 1200mg  (through diet ok) recommended - stopped due to high calcium Vitamin D: 1000 units a day recommended- considering starting A/P: low bone density- offered DEXA- she declines   # GERD S:Medication: Omeprazole 20 mg A/P: stable- continue current medicines  . Not ideal with low bone density  #concern for neuropathy -Numbness in both feet when lays on her back particularly toes - feels fine sitting.  - does have diabetes which could contribute but had seen Dr. Berline Chough years ago for sciatica and with positional element of her symptoms- we opted to refer back for his opinion per her preference  Recommended follow up: Return in about 2 months (around 07/29/2023) for followup or sooner if needed.Schedule b4 you leave. Future Appointments  Date Time Provider Department Center  12/02/2023  2:00 PM [facility] ANNUAL WELLNESS VISIT 1 [facility]   Lab/Order associations:NOT fasting-will return for labs   ICD-10-CM   1. Preventative health care  Z00.00     2. Diabetes mellitus without complication (HCC)  E11.9 Comprehensive metabolic panel    CBC with Differential/Platelet    Lipid panel    Hemoglobin A1c    3. Primary hypertension  I10     4. Hyperlipidemia, unspecified hyperlipidemia type  E78.5     5. Hyperglycemia  R73.9     6. Screening for diabetes mellitus  Z13.1     7. Osteopenia of neck of right femur  M85.851     8. High risk medication use  Z79.899 Vitamin B12    9. Back pain with left-sided sciatica  M54.32 Ambulatory referral to Sports Medicine    10. Neuropathy  G62.9 Ambulatory referral to Sports Medicine     Return precautions advised.  Tana Conch, MD

## 2023-05-29 NOTE — Patient Instructions (Signed)
Tetanus, Diphtheria, and Pertussis (Tdap) due at pharmacy Let us know if you get shingrix  blood pressure is well controlled today but she would like to trial off hydrochlorothiazide -she will continue amlodipine 10 mg - she will stop lisinopril hydrochlorothiazide 20-25 mg combo pill -she will start lisinopril 40 mg - she will follow up with me in office in 2 months - if blood pressure high at home after a few weeks should call sooner- ideal goal <140/90 but we typically tolerate up to 150/90 in her with lightheaded issues in past  We have placed a referral for you today to Dr. Berline Chough - [facility] on [address]. In some cases you will see # listed below- you can call this if you have not heard within a week. If you do not see # listed- you should receive a mychart message or phone call within a week with the # to call directly- call that as soon as you get it. If you are having issues getting scheduled reach out to [place] again.   Schedule a lab visit at the check out desk within 2 weeks. Return for future fasting labs meaning nothing but water after midnight please. Ok to take your medications with water.   Recommended follow up: Return in about 2 months (around 07/29/2023) for followup or sooner if needed.Schedule b4 you leave.

## 2023-06-03 ENCOUNTER — Telehealth: Payer: Self-pay | Admitting: Family Medicine

## 2023-06-03 NOTE — Telephone Encounter (Signed)
Patient requests to be called re: questions about dosage of new RX for lisinopril (ZESTRIL) 40 MG tablet

## 2023-06-04 NOTE — Telephone Encounter (Signed)
We changed because she requested to come off of medication  From the visit  blood pressure is well controlled today but she would like to trial off hydrochlorothiazide -she will continue amlodipine 10 mg - she will stop lisinopril hydrochlorothiazide 20-25 mg combo pill -she will start lisinopril 40 mg - she will follow up with me in office in 2 months

## 2023-06-04 NOTE — Telephone Encounter (Signed)
Called and spoke with pt and labs reviewed.

## 2023-06-04 NOTE — Telephone Encounter (Signed)
We were completely removing an entire medicine and her blood pressure was well-controlled-the anticipation is that blood pressure will go up with coming off of hydrochlorothiazide so we counteracted that by increasing her lisinopril to 40 mg to keep her blood pressure in healthy range

## 2023-06-04 NOTE — Telephone Encounter (Signed)
Called and spoke with pt but is still needing clarity on why did you bump up the 20 mg of lisinopril to the 40mg  instead of keeping her at lisinopril 20mg . She states since the lisinopril in the combo pill was 20mg  that the single lisinopril should still be 20mg  and not 40 mg.

## 2023-06-04 NOTE — Telephone Encounter (Signed)
Called and spoke with pt and she  wants to know why the Lisinopril dose was increased from 20mg  to 40 mg. She states that she has trouble swallowing pills and the 40mg  pill is huge and that her bp was the best it has been in a while when she was in office.

## 2023-06-10 ENCOUNTER — Other Ambulatory Visit (INDEPENDENT_AMBULATORY_CARE_PROVIDER_SITE_OTHER): Payer: PPO

## 2023-06-10 DIAGNOSIS — Z79899 Other long term (current) drug therapy: Secondary | ICD-10-CM | POA: Diagnosis not present

## 2023-06-10 DIAGNOSIS — E119 Type 2 diabetes mellitus without complications: Secondary | ICD-10-CM

## 2023-06-10 LAB — LIPID PANEL
Cholesterol: 166 mg/dL (ref 0–200)
HDL: 35.4 mg/dL — ABNORMAL LOW (ref >39.00)
LDL Cholesterol: 107 mg/dL — ABNORMAL HIGH (ref 0–99)
NonHDL: 130.17
Total CHOL/HDL Ratio: 5
Triglycerides: 118 mg/dL (ref 0.0–149.0)
VLDL: 23.6 mg/dL (ref 0.0–40.0)

## 2023-06-10 LAB — CBC WITH DIFFERENTIAL/PLATELET
Basophils Absolute: 0.1 10*3/uL (ref 0.0–0.1)
Basophils Relative: 0.9 % (ref 0.0–3.0)
Eosinophils Absolute: 0.3 10*3/uL (ref 0.0–0.7)
Eosinophils Relative: 4.4 % (ref 0.0–5.0)
HCT: 42.4 % (ref 36.0–46.0)
Hemoglobin: 14.6 g/dL (ref 12.0–15.0)
Lymphocytes Relative: 18.6 % (ref 12.0–46.0)
Lymphs Abs: 1.4 10*3/uL (ref 0.7–4.0)
MCHC: 34.4 g/dL (ref 30.0–36.0)
MCV: 91.8 fl (ref 78.0–100.0)
Monocytes Absolute: 0.7 10*3/uL (ref 0.1–1.0)
Monocytes Relative: 9.4 % (ref 3.0–12.0)
Neutro Abs: 5 10*3/uL (ref 1.4–7.7)
Neutrophils Relative %: 66.7 % (ref 43.0–77.0)
Platelets: 293 10*3/uL (ref 150.0–400.0)
RBC: 4.62 Mil/uL (ref 3.87–5.11)
RDW: 13 % (ref 11.5–15.5)
WBC: 7.5 10*3/uL (ref 4.0–10.5)

## 2023-06-10 LAB — HEMOGLOBIN A1C: Hgb A1c MFr Bld: 6.4 % (ref 4.6–6.5)

## 2023-06-10 LAB — COMPREHENSIVE METABOLIC PANEL
ALT: 14 U/L (ref 0–35)
AST: 17 U/L (ref 0–37)
Albumin: 4.1 g/dL (ref 3.5–5.2)
Alkaline Phosphatase: 78 U/L (ref 39–117)
BUN: 11 mg/dL (ref 6–23)
CO2: 29 mEq/L (ref 19–32)
Calcium: 10 mg/dL (ref 8.4–10.5)
Chloride: 104 mEq/L (ref 96–112)
Creatinine, Ser: 0.73 mg/dL (ref 0.40–1.20)
GFR: 74.82 mL/min (ref >60.00)
Glucose, Bld: 136 mg/dL — ABNORMAL HIGH (ref 70–99)
Potassium: 3.7 mEq/L (ref 3.5–5.1)
Sodium: 141 mEq/L (ref 135–145)
Total Bilirubin: 0.9 mg/dL (ref 0.2–1.2)
Total Protein: 7.3 g/dL (ref 6.0–8.3)

## 2023-06-10 LAB — VITAMIN B12: Vitamin B-12: 926 pg/mL — ABNORMAL HIGH (ref 211–911)

## 2023-07-29 ENCOUNTER — Ambulatory Visit (INDEPENDENT_AMBULATORY_CARE_PROVIDER_SITE_OTHER): Payer: PPO | Admitting: Family Medicine

## 2023-07-29 ENCOUNTER — Encounter: Payer: Self-pay | Admitting: Family Medicine

## 2023-07-29 VITALS — BP 160/70 | HR 60 | Temp 98.7°F | Ht 64.0 in | Wt 168.0 lb

## 2023-07-29 DIAGNOSIS — I10 Essential (primary) hypertension: Secondary | ICD-10-CM | POA: Diagnosis not present

## 2023-07-29 MED ORDER — LISINOPRIL-HYDROCHLOROTHIAZIDE 20-12.5 MG PO TABS: 1.0000 | ORAL_TABLET | Freq: Every day | ORAL | 3 refills | Status: DC

## 2023-07-29 NOTE — Progress Notes (Signed)
Phone [phone] In person visit   Subjective:   Tammie [name] is a 87 y.o. year old very pleasant female patient who presents for/with See problem oriented charting Chief Complaint  Patient presents with   Hypertension   Diabetes    Past Medical History-  Patient Active Problem List   Diagnosis Date Noted   Diabetes mellitus without complication (HCC) 07/15/2017    Priority: High   Osteopenia 10/02/2015    Priority: High   Venous insufficiency 11/15/2021    Priority: Medium    Hyperlipidemia 01/15/2018    Priority: Medium    Vertigo 07/15/2017    Priority: Medium    Hypertension     Priority: Medium    Panic attacks     Priority: Medium    History of colorectal cancer     Priority: Medium    Asthma     Priority: Medium    Left leg pain 12/20/2016    Priority: Low   Hemorrhoids 12/20/2016    Priority: Low   Dermatitis 12/20/2016    Priority: Low   Systolic murmur 06/21/2015    Priority: Low   Allergic rhinitis     Priority: Low   GERD 06/22/2009    Priority: Low   Tick bite of thigh, left, initial encounter 02/07/2023    Medications- reviewed and updated Current Outpatient Medications  Medication Sig Dispense Refill   albuterol (VENTOLIN HFA) 108 (90 Base) MCG/ACT inhaler Inhale 2 puffs into the lungs every 6 (six) hours as needed for wheezing or shortness of breath. 18 g 2   amLODipine (NORVASC) 10 MG tablet TAKE ONE TABLET ONCE DAILY 90 tablet 3   augmented betamethasone dipropionate (DIPROLENE-AF) 0.05 % cream      azelastine (ASTELIN) 0.1 % nasal spray Place 1 spray into both nostrils 2 (two) times daily. Use in each nostril as directed 30 mL 4   clobetasol (TEMOVATE) 0.05 % external solution Apply 1 application topically 2 (two) times daily. For 7-10 days up to twice a day maximum. Use only on scalp 50 mL 0   fluticasone furoate-vilanterol (BREO ELLIPTA) 100-25 MCG/ACT AEPB Inhale 1 puff into the lungs daily. 1 each 11   hydrocortisone (ANUSOL-HC)  2.5 % rectal cream Place 1 application rectally 2 times daily as needed for hemorrhoids. 28.35 g 1   lisinopril-hydrochlorothiazide (ZESTORETIC) 20-12.5 MG tablet Take 1 tablet by mouth daily. 90 tablet 3   meclizine (ANTIVERT) 25 MG tablet Take 1 tablet (25 mg total) by mouth 2 (two) times daily as needed (vertigo). 30 tablet 0   omeprazole (PRILOSEC) 20 MG capsule TAKE ONE CAPSULE ONCE DAILY 90 capsule 3   PARoxetine (PAXIL) 20 MG tablet Take 1 tablet (20 mg total) by mouth daily. 90 tablet 3   Vitamin D-Vitamin K (VITAMIN K2-VITAMIN D3 PO) Take by mouth. Drops     No current facility-administered medications for this visit.     Objective:  BP (!) 160/70   Pulse 60   Temp 98.7 F (37.1 C) (Temporal)   Ht 5\' 4"  (1.626 m)   Wt 168 lb (76.2 kg)   SpO2 97%   BMI 28.84 kg/m  Gen: NAD, resting comfortably CV: RRR no murmurs rubs or gallops Lungs: CTAB no crackles, wheeze, rhonchi Abdomen: soft/nontender/nondistended/normal bowel sounds. No rebound or guarding.  Ext: trace to 1+ edema Skin: warm, dry     Assessment and Plan    #hypertension with history of orthostatic issues and blood pressure goal less than 150/90 S: medication:  Previously on Amlodipine 10 mg, lisinopril hydrochlorothiazide 20-25 mg  but at last visit she wanted to trial off of hydrochlorothiazide.  We continued amlodipine 10 mg, stop lisinopril hydrochlorothiazide 20-25 mg and started lisinopril 40 mg. She ended up deciding to take amlodipine 10 mg and half of lisinopril 40 mg so 20 mg total.   Home readings #s: brings wrist cuff with average reading probably 145/70. Only mild improvement in going to bathroom at night from 3x a night to 2x a night.  A/P: blood pressure is above goal both here and at home but she has had less nocturia at 2x a night instead of 3x a night on lower dose so wants to see if she can try a lower dose of hydrochlorothiazide at 12.5 mg  Stop lisinopril 40 mg half tablet  Start lisinopril  hydrochlorothiazide 20-12.5 mg daily  Continue amlodipine  If you can do some home checks in meantime with a new upper arm cuff that's reasonable- omron series 3    Recommended follow up: Return in about 1 month (around 08/28/2023) for followup or sooner if needed.Schedule b4 you leave. Future Appointments  Date Time Provider Department Center  09/04/2023  4:00 PM Shelva Majestic, MD [facility]  12/02/2023  2:00 PM [facility] ANNUAL WELLNESS VISIT 1 [facility]    Lab/Order associations:   ICD-10-CM   1. Primary hypertension  I10       Meds ordered this encounter  Medications   lisinopril-hydrochlorothiazide (ZESTORETIC) 20-12.5 MG tablet    Sig: Take 1 tablet by mouth daily.    Dispense:  90 tablet    Refill:  3    Replaces lisinopril 40mg     Return precautions advised.  Tana Conch, MD

## 2023-07-29 NOTE — Patient Instructions (Addendum)
Stop lisinopril 40 mg half tablet  Start lisinopril hydrochlorothiazide 20-12.5 mg daily  Continue amlodipine  If you can do some home checks in meantime with a new upper arm cuff that's reasonable- omron series 3   Recommended follow up: Return in about 1 month (around 08/28/2023) for followup or sooner if needed.Schedule b4 you leave.

## 2023-07-31 DIAGNOSIS — H35033 Hypertensive retinopathy, bilateral: Secondary | ICD-10-CM | POA: Diagnosis not present

## 2023-07-31 DIAGNOSIS — H5213 Myopia, bilateral: Secondary | ICD-10-CM | POA: Diagnosis not present

## 2023-07-31 DIAGNOSIS — H40013 Open angle with borderline findings, low risk, bilateral: Secondary | ICD-10-CM | POA: Diagnosis not present

## 2023-09-04 ENCOUNTER — Encounter: Payer: Self-pay | Admitting: Family Medicine

## 2023-09-04 ENCOUNTER — Ambulatory Visit: Payer: PPO | Admitting: Family Medicine

## 2023-09-04 VITALS — BP 130/64 | HR 62 | Temp 98.0°F | Ht 64.0 in | Wt 167.8 lb

## 2023-09-04 DIAGNOSIS — I10 Essential (primary) hypertension: Secondary | ICD-10-CM | POA: Diagnosis not present

## 2023-09-04 DIAGNOSIS — M25512 Pain in left shoulder: Secondary | ICD-10-CM

## 2023-09-04 NOTE — Patient Instructions (Addendum)
Call Dr. Berline Chough for follow up for left shoulder pain  Blood pressure looks great- continue current medications   Recommended follow up: Return in about 5 months (around 02/02/2024) for followup or sooner if needed.Schedule b4 you leave.

## 2023-09-04 NOTE — Progress Notes (Signed)
Phone [phone] In person visit   Subjective:   Tammie [name] is a 88 y.o. year old very pleasant female patient who presents for/with See problem oriented charting Chief Complaint  Patient presents with   Hypertension    Pt here for 1 month f/u, pt states bp has been running good.     Past Medical History-  Patient Active Problem List   Diagnosis Date Noted   Diabetes mellitus without complication (HCC) 07/15/2017    Priority: High   Osteopenia 10/02/2015    Priority: High   Venous insufficiency 11/15/2021    Priority: Medium    Hyperlipidemia 01/15/2018    Priority: Medium    Vertigo 07/15/2017    Priority: Medium    Hypertension     Priority: Medium    Panic attacks     Priority: Medium    History of colorectal cancer     Priority: Medium    Asthma     Priority: Medium    Left leg pain 12/20/2016    Priority: Low   Hemorrhoids 12/20/2016    Priority: Low   Dermatitis 12/20/2016    Priority: Low   Systolic murmur 06/21/2015    Priority: Low   Allergic rhinitis     Priority: Low   GERD 06/22/2009    Priority: Low   Tick bite of thigh, left, initial encounter 02/07/2023    Medications- reviewed and updated Current Outpatient Medications  Medication Sig Dispense Refill   albuterol (VENTOLIN HFA) 108 (90 Base) MCG/ACT inhaler Inhale 2 puffs into the lungs every 6 (six) hours as needed for wheezing or shortness of breath. 18 g 2   amLODipine (NORVASC) 10 MG tablet TAKE ONE TABLET ONCE DAILY 90 tablet 3   augmented betamethasone dipropionate (DIPROLENE-AF) 0.05 % cream      azelastine (ASTELIN) 0.1 % nasal spray Place 1 spray into both nostrils 2 (two) times daily. Use in each nostril as directed 30 mL 4   clobetasol (TEMOVATE) 0.05 % external solution Apply 1 application topically 2 (two) times daily. For 7-10 days up to twice a day maximum. Use only on scalp 50 mL 0   fluticasone furoate-vilanterol (BREO ELLIPTA) 100-25 MCG/ACT AEPB Inhale 1 puff into the  lungs daily. 1 each 11   hydrocortisone (ANUSOL-HC) 2.5 % rectal cream Place 1 application rectally 2 times daily as needed for hemorrhoids. 28.35 g 1   lisinopril-hydrochlorothiazide (ZESTORETIC) 20-12.5 MG tablet Take 1 tablet by mouth daily. 90 tablet 3   omeprazole (PRILOSEC) 20 MG capsule TAKE ONE CAPSULE ONCE DAILY 90 capsule 3   PARoxetine (PAXIL) 20 MG tablet Take 1 tablet (20 mg total) by mouth daily. 90 tablet 3   Vitamin D-Vitamin K (VITAMIN K2-VITAMIN D3 PO) Take by mouth. Drops     meclizine (ANTIVERT) 25 MG tablet Take 1 tablet (25 mg total) by mouth 2 (two) times daily as needed (vertigo). (Patient not taking: Reported on 09/04/2023) 30 tablet 0   No current facility-administered medications for this visit.     Objective:  BP 130/64   Pulse 62   Temp 98 F (36.7 C)   Ht 5\' 4"  (1.626 m)   Wt 167 lb 12.8 oz (76.1 kg)   SpO2 94%   BMI 28.80 kg/m  Gen: NAD, resting comfortably CV: RRR no murmurs rubs or gallops Lungs: CTAB no crackles, wheeze, rhonch Ext: no edema Skin: warm, dry Normal right shoulder exam.  Left shoulder with signs of impingement with positive Neer, Hawkins, indican  test and has pain with pushoff from her back concerning for rotator cuff tendinitis versus bursitis    Assessment and Plan   #hypertension with history of orthostatic issues and blood pressure goal less than 150/90 S: medication: Amlodipine 10 mg, lisinopril hydrochlorothiazide 20-12.5 mg (prior 25 mg but urinary symptoms with nocturia- still down from 3 to 2x a night)  BP Readings from Last 3 Encounters:  09/04/23 130/64  07/29/23 (!) 160/70  05/29/23 138/60  A/P: Blood pressure is well-controlled-continue current medication-discussed Advil may raise blood pressure but since blood pressure is looking good we opted to continue sparing as needed low-dose use  # left shoulder pain S:a month ago was carrying granddaughter and noted more shoulder pain- also has noted some senitivity over  mid arm when blood pressure is checked. Also some left neck pain that then goes down the shoulder and has been all the way to the hand (about 3 weeks and can feel numb) . Worse if lays on left side- better if avoids not laying on that side. Does hold phone up in bed on left arm.  -one advil helpful at night and in morning A/P: I suspect rotator cuff versus bursitis with multiple signs of impingement-she is already established with Dr. Mora Bellman and would like to call to schedule follow-up with him.  [facility] but [facility] declined for now.  Recommended follow up: Return in about 4 months (around 01/03/2024) for followup or sooner if needed.Schedule b4 you leave. Future Appointments  Date Time Provider Department Center  12/02/2023  2:00 PM [facility] ANNUAL WELLNESS VISIT 1 [facility]  01/06/2024  1:00 PM Shelva Majestic, MD [facility]    Lab/Order associations:   ICD-10-CM   1. Primary hypertension  I10     2. Acute pain of left shoulder  M25.512       No orders of the defined types were placed in this encounter.   Return precautions advised.  Tana Conch, MD

## 2023-09-19 ENCOUNTER — Telehealth: Payer: Self-pay | Admitting: *Deleted

## 2023-09-19 NOTE — Telephone Encounter (Signed)
 Copied from CRM [id number]. Topic: Clinical - Medication Question >> Sep 19, 2023  2:16 PM Burnard DEL wrote: Reason for CRM: patient called in stating that she has had a lingering cough for a while now,and in the past she was prescribed tessalon  pearls for her cough.She would like to know if she could have some sent in for her without her being seen? I advised her that she may have to be seen to get any medications prescribed to her,however she wanted me to ask to check first.    Please schedule an office visit for cough  Tammie [name],RMA

## 2023-10-30 ENCOUNTER — Other Ambulatory Visit: Payer: Self-pay | Admitting: Sports Medicine

## 2023-10-30 ENCOUNTER — Other Ambulatory Visit: Payer: PPO

## 2023-10-30 DIAGNOSIS — M542 Cervicalgia: Secondary | ICD-10-CM

## 2023-10-30 DIAGNOSIS — M25512 Pain in left shoulder: Secondary | ICD-10-CM

## 2023-11-26 ENCOUNTER — Ambulatory Visit
Admission: RE | Admit: 2023-11-26 | Discharge: 2023-11-26 | Disposition: A | Discharge status: Home / Self Care | Source: Ambulatory Visit | Attending: Sports Medicine | Admitting: Sports Medicine

## 2023-11-26 DIAGNOSIS — M542 Cervicalgia: Secondary | ICD-10-CM

## 2023-11-26 DIAGNOSIS — M25512 Pain in left shoulder: Secondary | ICD-10-CM

## 2023-12-02 ENCOUNTER — Ambulatory Visit: Payer: PPO

## 2023-12-08 ENCOUNTER — Other Ambulatory Visit: Payer: Self-pay | Admitting: Family Medicine

## 2023-12-08 DIAGNOSIS — F411 Generalized anxiety disorder: Secondary | ICD-10-CM

## 2023-12-09 DIAGNOSIS — M9902 Segmental and somatic dysfunction of thoracic region: Secondary | ICD-10-CM | POA: Diagnosis not present

## 2023-12-09 DIAGNOSIS — J309 Allergic rhinitis, unspecified: Secondary | ICD-10-CM | POA: Diagnosis not present

## 2023-12-09 DIAGNOSIS — F4321 Adjustment disorder with depressed mood: Secondary | ICD-10-CM | POA: Diagnosis not present

## 2023-12-09 DIAGNOSIS — M542 Cervicalgia: Secondary | ICD-10-CM | POA: Diagnosis not present

## 2023-12-09 DIAGNOSIS — M9907 Segmental and somatic dysfunction of upper extremity: Secondary | ICD-10-CM | POA: Diagnosis not present

## 2023-12-09 DIAGNOSIS — M9908 Segmental and somatic dysfunction of rib cage: Secondary | ICD-10-CM | POA: Diagnosis not present

## 2023-12-09 DIAGNOSIS — I10 Essential (primary) hypertension: Secondary | ICD-10-CM | POA: Diagnosis not present

## 2023-12-09 DIAGNOSIS — M9901 Segmental and somatic dysfunction of cervical region: Secondary | ICD-10-CM | POA: Diagnosis not present

## 2023-12-09 DIAGNOSIS — M25512 Pain in left shoulder: Secondary | ICD-10-CM | POA: Diagnosis not present

## 2024-01-05 ENCOUNTER — Ambulatory Visit

## 2024-01-06 ENCOUNTER — Other Ambulatory Visit: Payer: Self-pay | Admitting: Family Medicine

## 2024-01-06 ENCOUNTER — Encounter: Payer: Self-pay | Admitting: Family Medicine

## 2024-01-06 ENCOUNTER — Ambulatory Visit (INDEPENDENT_AMBULATORY_CARE_PROVIDER_SITE_OTHER): Payer: PPO | Admitting: Family Medicine

## 2024-01-06 VITALS — BP 136/80 | HR 76 | Temp 98.1°F | Ht 64.0 in | Wt 169.0 lb

## 2024-01-06 DIAGNOSIS — I499 Cardiac arrhythmia, unspecified: Secondary | ICD-10-CM | POA: Diagnosis not present

## 2024-01-06 DIAGNOSIS — E785 Hyperlipidemia, unspecified: Secondary | ICD-10-CM | POA: Diagnosis not present

## 2024-01-06 DIAGNOSIS — I10 Essential (primary) hypertension: Secondary | ICD-10-CM | POA: Diagnosis not present

## 2024-01-06 DIAGNOSIS — E119 Type 2 diabetes mellitus without complications: Secondary | ICD-10-CM

## 2024-01-06 DIAGNOSIS — M85851 Other specified disorders of bone density and structure, right thigh: Secondary | ICD-10-CM | POA: Diagnosis not present

## 2024-01-06 LAB — CBC WITH DIFFERENTIAL/PLATELET
Basophils Absolute: 0 10*3/uL (ref 0.0–0.1)
Basophils Relative: 0.5 % (ref 0.0–3.0)
Eosinophils Absolute: 0.3 10*3/uL (ref 0.0–0.7)
Eosinophils Relative: 3.4 % (ref 0.0–5.0)
HCT: 41.4 % (ref 36.0–46.0)
Hemoglobin: 14.5 g/dL (ref 12.0–15.0)
Lymphocytes Relative: 20.1 % (ref 12.0–46.0)
Lymphs Abs: 1.7 10*3/uL (ref 0.7–4.0)
MCHC: 35.1 g/dL (ref 30.0–36.0)
MCV: 90.5 fl (ref 78.0–100.0)
Monocytes Absolute: 0.8 10*3/uL (ref 0.1–1.0)
Monocytes Relative: 8.9 % (ref 3.0–12.0)
Neutro Abs: 5.7 10*3/uL (ref 1.4–7.7)
Neutrophils Relative %: 67.1 % (ref 43.0–77.0)
Platelets: 280 10*3/uL (ref 150.0–400.0)
RBC: 4.58 Mil/uL (ref 3.87–5.11)
RDW: 13.7 % (ref 11.5–15.5)
WBC: 8.5 10*3/uL (ref 4.0–10.5)

## 2024-01-06 LAB — COMPREHENSIVE METABOLIC PANEL WITH GFR
ALT: 16 U/L (ref 0–35)
AST: 21 U/L (ref 0–37)
Albumin: 4.3 g/dL (ref 3.5–5.2)
Alkaline Phosphatase: 78 U/L (ref 39–117)
BUN: 17 mg/dL (ref 6–23)
CO2: 32 meq/L (ref 19–32)
Calcium: 10.2 mg/dL (ref 8.4–10.5)
Chloride: 99 meq/L (ref 96–112)
Creatinine, Ser: 0.88 mg/dL (ref 0.40–1.20)
GFR: 59.55 mL/min — ABNORMAL LOW (ref >60.00)
Glucose, Bld: 143 mg/dL — ABNORMAL HIGH (ref 70–99)
Potassium: 4.1 meq/L (ref 3.5–5.1)
Sodium: 138 meq/L (ref 135–145)
Total Bilirubin: 0.7 mg/dL (ref 0.2–1.2)
Total Protein: 7.4 g/dL (ref 6.0–8.3)

## 2024-01-06 LAB — HEMOGLOBIN A1C: Hgb A1c MFr Bld: 6.8 % — ABNORMAL HIGH (ref 4.6–6.5)

## 2024-01-06 LAB — TSH: TSH: 3.92 u[IU]/mL (ref 0.35–5.50)

## 2024-01-06 NOTE — Progress Notes (Signed)
 Phone [phone] In person visit   Subjective:   Tammie [name] is a 89 y.o. year old very pleasant female patient who presents for/with See problem oriented charting Chief Complaint  Patient presents with   Medical Management of Chronic Issues   Hypertension   Diabetes    Past Medical History-  Patient Active Problem List   Diagnosis Date Noted   Diabetes mellitus without complication (HCC) 07/15/2017    Priority: High   Osteopenia 10/02/2015    Priority: High   Venous insufficiency 11/15/2021    Priority: Medium    Hyperlipidemia 01/15/2018    Priority: Medium    Vertigo 07/15/2017    Priority: Medium    Hypertension     Priority: Medium    Panic attacks     Priority: Medium    History of colorectal cancer     Priority: Medium    Asthma     Priority: Medium    Left leg pain 12/20/2016    Priority: Low   Hemorrhoids 12/20/2016    Priority: Low   Dermatitis 12/20/2016    Priority: Low   Systolic murmur 06/21/2015    Priority: Low   Allergic rhinitis     Priority: Low   GERD 06/22/2009    Priority: Low   Tick bite of thigh, left, initial encounter 02/07/2023    Medications- reviewed and updated Current Outpatient Medications  Medication Sig Dispense Refill   albuterol  (VENTOLIN  HFA) 108 (90 Base) MCG/ACT inhaler Inhale 2 puffs into the lungs every 6 (six) hours as needed for wheezing or shortness of breath. 18 g 2   amLODipine  (NORVASC ) 10 MG tablet TAKE ONE TABLET ONCE DAILY 90 tablet 3   augmented betamethasone dipropionate (DIPROLENE-AF) 0.05 % cream      azelastine  (ASTELIN ) 0.1 % nasal spray Place 1 spray into both nostrils 2 (two) times daily. Use in each nostril as directed 30 mL 4   clobetasol  (TEMOVATE ) 0.05 % external solution Apply 1 application topically 2 (two) times daily. For 7-10 days up to twice a day maximum. Use only on scalp 50 mL 0   fluticasone  furoate-vilanterol (BREO ELLIPTA ) 100-25 MCG/ACT AEPB Inhale 1 puff into the lungs daily. 1  each 11   hydrocortisone  (ANUSOL -HC) 2.5 % rectal cream Place 1 application rectally 2 times daily as needed for hemorrhoids. 28.35 g 1   lisinopril -hydrochlorothiazide  (ZESTORETIC ) 20-12.5 MG tablet Take 1 tablet by mouth daily. 90 tablet 3   meclizine  (ANTIVERT ) 25 MG tablet Take 1 tablet (25 mg total) by mouth 2 (two) times daily as needed (vertigo). 30 tablet 0   omeprazole  (PRILOSEC) 20 MG capsule TAKE ONE CAPSULE ONCE DAILY 90 capsule 3   PARoxetine  (PAXIL ) 20 MG tablet TAKE ONE TABLET EVERY DAY 90 tablet 3   Vitamin D-Vitamin K (VITAMIN K2-VITAMIN D3 PO) Take by mouth. Drops     No current facility-administered medications for this visit.     Objective:  BP 136/80   Pulse 76   Temp 98.1 F (36.7 C)   Ht 5\' 4"  (1.626 m)   Wt 169 lb (76.7 kg)   SpO2 96%   BMI 29.01 kg/m  Gen: NAD, resting comfortably CV: irregularly irregular  Lungs: CTAB no crackles, wheeze, rhonchi Ext: trace to 1+ edema Skin: warm, dry  EKG: first degree av block with rate 70 with what appears to be a single PVC and single APC, normal axis, normal (other than prolonged PR) intervals, no hypertrophy, RBBB unchanged from 3/6/2017but otherwise no  st or t wave changes     Assessment and Plan   #Irregular heart rate - noted at Dr. Rheta Celestine office and today heart rate is irregularly irregular - will check EKG to be on safe side . Has also had checks on home blood pressure monitor over 100 and then later rechecked and down to 50.  -PVC and PAC on EKG- likely causing this- offered monitoring cardiac (one time EKG doesn't firmly rule out a fib but makes less likely) or cardiology referral but she prefers to hold off- will check TSH   # Cervicalgia/left shoulder pain-working with Dr. Donelda Fujita and had x-rays about a month ago- some arthritis in the neck. She is going to try some magnesium- has tried 4 nights and has rested better.   # Diabetes-New diagnosis 11/29/2021 with A1c 7.1 S: Medication:Diet  controlled Exercise and diet- weight up slightly Lab Results  Component Value Date   HGBA1C 6.4 06/10/2023   HGBA1C 6.5 11/21/2022   HGBA1C 6.2 06/05/2022  A/P: hopefully stable- update a1c today. Continue without meds for now   #hypertension with history of orthostatic issues and blood pressure goal less than 150/90 S: medication: Amlodipine  10 mg, lisinopril  hydrochlorothiazide  20-12.5 mg (prior 25 mg but urinary symptoms with nocturia) BP Readings from Last 3 Encounters:  01/06/24 136/80  09/04/23 130/64  07/29/23 (!) 160/70  A/P: well controlled continue current medications    #hyperlipidemia-have opted to not start medication for primary prevention over age 60 S: Medication:none Lab Results  Component Value Date   CHOL 166 06/10/2023   HDL 35.40 (L) 06/10/2023   LDLCALC 107 (H) 06/10/2023   LDLDIRECT 115.0 10/26/2019   TRIG 118.0 06/10/2023   CHOLHDL 5 06/10/2023   A/P: still prefers to remain off medicine even with mild elevations - particulraly with age- continue to monitor - recheck after September   #Panic attacks-have been well controlled on Paxil  20 mg - only on half tablet though -SI: none   # Asthma S: Maintenance Medication: Jodell Munda only if outdoors much (more in spring with pollen)- Symbicort  not covered.  As needed medication: albuterol  - patient not using A/P: reasonable control- continue current medications    # Low Bone density (formerly osteopenia) S: Last DEXA: Worst T score -2.4 on 09/27/2015 and right femoral neck  Calcium : 1200mg  (through diet ok) recommended  Vitamin D: 1000 units a day recommended A/P: offered DEXA - she is in agreement    # GERD S:Medication: Omeprazole  20 mg -Has had normal B12 in the past  A/P: reasonable control as long as she takes this- continue current medications    Recommended follow up: Return in about 5 months (around 06/07/2024) for physical or sooner if needed.Schedule b4 you leave. Future Appointments  Date Time  Provider Department Center  02/04/2024  8:00 AM [facility] ANNUAL [facility]LLNESS VISIT 1 [facility]    Lab/Order associations:   ICD-10-CM   1. Irregular heart rate  I49.9 EKG 12-Lead    2. Diabetes mellitus without complication (HCC)  E11.9 Hemoglobin A1c    Comprehensive metabolic panel with GFR    3. Hyperlipidemia, unspecified hyperlipidemia type  E78.5 TSH    CBC with Differential/Platelet    4. Primary hypertension  I10 TSH    CBC with Differential/Platelet    5. Osteopenia of right femoral neck  M85.851 DG Bone Density      No orders of the defined types were placed in this encounter.   Return precautions advised.  Clarisa Crooked, MD

## 2024-01-06 NOTE — Patient Instructions (Addendum)
 EKG today- 2 different types of early beats- PAC and PVC- please let me know if you decide you want cardiology referral or cardiac monitoring  Schedule your bone density test at check out desk.  - located [address] across the street from [place] - in the basement - you DO NEED an appointment for the bone density tests.   Please stop by lab before you go If you have mychart- we will send your results within 3 business days of us  receiving them.  If you do not have mychart- we will call you about results within 5 business days of us  receiving them.  *please also note that you will see labs on mychart as soon as they post. I will later go in and write notes on them- will say "notes from Dr. Arlene Ben"   Recommended follow up: Return in about 5 months (around 06/07/2024) for physical or sooner if needed.Schedule b4 you leave.

## 2024-01-22 DIAGNOSIS — M9902 Segmental and somatic dysfunction of thoracic region: Secondary | ICD-10-CM | POA: Diagnosis not present

## 2024-01-22 DIAGNOSIS — M25512 Pain in left shoulder: Secondary | ICD-10-CM | POA: Diagnosis not present

## 2024-01-22 DIAGNOSIS — M542 Cervicalgia: Secondary | ICD-10-CM | POA: Diagnosis not present

## 2024-01-22 DIAGNOSIS — M9907 Segmental and somatic dysfunction of upper extremity: Secondary | ICD-10-CM | POA: Diagnosis not present

## 2024-01-22 DIAGNOSIS — M6281 Muscle weakness (generalized): Secondary | ICD-10-CM | POA: Diagnosis not present

## 2024-01-22 DIAGNOSIS — M9901 Segmental and somatic dysfunction of cervical region: Secondary | ICD-10-CM | POA: Diagnosis not present

## 2024-01-22 DIAGNOSIS — M9908 Segmental and somatic dysfunction of rib cage: Secondary | ICD-10-CM | POA: Diagnosis not present

## 2024-02-04 ENCOUNTER — Encounter: Payer: Self-pay | Admitting: Physical Therapy

## 2024-02-04 ENCOUNTER — Ambulatory Visit (INDEPENDENT_AMBULATORY_CARE_PROVIDER_SITE_OTHER)

## 2024-02-04 ENCOUNTER — Ambulatory Visit: Attending: Sports Medicine | Admitting: Physical Therapy

## 2024-02-04 VITALS — Ht 64.0 in | Wt 169.0 lb

## 2024-02-04 DIAGNOSIS — M62838 Other muscle spasm: Secondary | ICD-10-CM | POA: Diagnosis not present

## 2024-02-04 DIAGNOSIS — M25512 Pain in left shoulder: Secondary | ICD-10-CM | POA: Diagnosis not present

## 2024-02-04 DIAGNOSIS — Z0000 Encounter for general adult medical examination without abnormal findings: Secondary | ICD-10-CM

## 2024-02-04 DIAGNOSIS — M25612 Stiffness of left shoulder, not elsewhere classified: Secondary | ICD-10-CM | POA: Insufficient documentation

## 2024-02-04 DIAGNOSIS — M542 Cervicalgia: Secondary | ICD-10-CM | POA: Diagnosis not present

## 2024-02-04 DIAGNOSIS — G8929 Other chronic pain: Secondary | ICD-10-CM | POA: Insufficient documentation

## 2024-02-04 NOTE — Progress Notes (Addendum)
 Subjective:   Tammie [name] is a 89 y.o. who presents for a Medicare Wellness preventive visit.  As a reminder, Annual Wellness Visits don't include a physical exam, and some assessments may be limited, especially if this visit is performed virtually. We may recommend an in-person follow-up visit with your provider if needed.  Visit Complete: Virtual I connected with  Tammie [name] on 02/04/24 by a audio enabled telemedicine application and verified that I am speaking with the correct person using two identifiers.  Patient Location: Home  Provider Location: Home Office  I discussed the limitations of evaluation and management by telemedicine. The patient expressed understanding and agreed to proceed.  Vital Signs: Because this visit was a virtual/telehealth visit, some criteria may be missing or patient reported. Any vitals not documented were not able to be obtained and vitals that have been documented are patient reported.  VideoDeclined- This patient declined Librarian, academic. Therefore the visit was completed with audio only.  Persons Participating in Visit: Patient.  AWV Questionnaire: No: Patient Medicare AWV questionnaire was not completed prior to this visit.  Cardiac Risk Factors include: advanced age (>22men, >62 women);diabetes mellitus;dyslipidemia;hypertension     Objective:     Today's Vitals   02/04/24 0805  Weight: 169 lb (76.7 kg)  Height: 5\' 4"  (1.626 m)   Body mass index is 29.01 kg/m.     02/04/2024    8:20 AM 11/28/2022    2:53 PM 11/08/2021    8:16 AM 11/02/2020    2:10 PM 07/27/2019    3:24 PM 06/10/2018    4:47 PM 11/21/2015    6:29 AM  Advanced Directives  Does Patient Have a Medical Advance Directive? Yes Yes Yes Yes Yes Yes Yes  Type of Estate agent of [place];Living will Healthcare Power of [place];Living will Healthcare Power of [place] Living will Healthcare Power of [place];Living  will Healthcare Power of [place];Living will   Does patient want to make changes to medical advance directive?     No - Patient declined No - Patient declined   Copy of Healthcare Power of Attorney in Chart? No - copy requested No - copy requested No - copy requested  No - copy requested No - copy requested     Current Medications (verified) Outpatient Encounter Medications as of 02/04/2024  Medication Sig   albuterol  (VENTOLIN  HFA) 108 (90 Base) MCG/ACT inhaler Inhale 2 puffs into the lungs every 6 (six) hours as needed for wheezing or shortness of breath.   amLODipine  (NORVASC ) 10 MG tablet TAKE ONE TABLET ONCE DAILY   augmented betamethasone dipropionate (DIPROLENE-AF) 0.05 % cream    clobetasol  (TEMOVATE ) 0.05 % external solution Apply 1 application topically 2 (two) times daily. For 7-10 days up to twice a day maximum. Use only on scalp   fluticasone  furoate-vilanterol (BREO ELLIPTA ) 100-25 MCG/ACT AEPB Inhale 1 puff into the lungs daily.   hydrocortisone  (ANUSOL -HC) 2.5 % rectal cream Place 1 application rectally 2 times daily as needed for hemorrhoids.   lisinopril -hydrochlorothiazide  (ZESTORETIC ) 20-12.5 MG tablet Take 1 tablet by mouth daily.   omeprazole  (PRILOSEC) 20 MG capsule TAKE ONE CAPSULE ONCE DAILY   PARoxetine  (PAXIL ) 20 MG tablet TAKE ONE TABLET EVERY DAY   Vitamin D-Vitamin K (VITAMIN K2-VITAMIN D3 PO) Take by mouth. Drops   meclizine  (ANTIVERT ) 25 MG tablet Take 1 tablet (25 mg total) by mouth 2 (two) times daily as needed (vertigo). (Patient not taking: Reported on 02/04/2024)   [DISCONTINUED]  azelastine  (ASTELIN ) 0.1 % nasal spray Place 1 spray into both nostrils 2 (two) times daily. Use in each nostril as directed   No facility-administered encounter medications on file as of 02/04/2024.    Allergies (verified) Erythromycin, Fentanyl , Heparin, Hydrocodone-acetaminophen , Meperidine hcl, Penicillins, Procaine hcl, Promethazine hcl, and Temazepam   History: Past  Medical History:  Diagnosis Date   Abnormal uterine bleeding    Allergic rhinitis    Allergy    seasonal   Anxiety    panic attacks   Anxiety disorder    Asthma    Cataract    left eye surgery to removed   Cataract    right eye   Colorectal cancer (HCC) 1997   T3, N0   Depression    pt states she isn't depressed anymore, continues paxil  daily   Endometriosis    Fibroid    GERD (gastroesophageal reflux disease)    Hemorrhoids    Hyperlipidemia    diet controlled, no meds   Hypertension    PONV (postoperative nausea and vomiting)    Prediabetes    Seasonal allergies    SVD (spontaneous vaginal delivery)    x 3   Thyroid  disease    Urinary incontinence    Past Surgical History:  Procedure Laterality Date   cataract surgery Left    COLECTOMY  1997   SIGMOID COLECTOMY 1997   COLON SURGERY  1997   COLONOSCOPY     DILATATION & CURETTAGE/HYSTEROSCOPY WITH MYOSURE N/A 11/21/2015   Procedure: DILATATION & CURETTAGE/HYSTEROSCOPY WITH MYOSURE;  Surgeon: Wanita Gutta, MD;  Location: [facility];  Service: Gynecology;  Laterality: N/A;   DILATION AND CURETTAGE OF UTERUS     D & C   POLYPECTOMY     TONSILLECTOMY     Family History  Problem Relation Age of Onset   Sudden death Brother 31       Apparent DVT/PE. all males   Heart attack Brother    Varicose Veins Mother    Hypertension Father    Sudden death Cousin 28       Paternal cousin -dvt/pe 7 all males   Coronary artery disease Cousin 21       Paternal cousin, MI   Breast cancer Daughter 49   Stroke Maternal Grandmother    Stroke Maternal Grandfather    Early death Paternal Grandmother    Tuberculosis Paternal Grandmother    Early death Paternal Grandfather    Tuberculosis Paternal Grandfather    Hypertension Son    Colon cancer Neg Hx    Rectal cancer Neg Hx    Stomach cancer Neg Hx    Social History   Socioeconomic History   Marital status: Divorced    Spouse name: Not on file   Number of children: Not  on file   Years of education: Not on file   Highest education level: Some college, no degree  Occupational History   Occupation: part time  Tobacco Use   Smoking status: Never   Smokeless tobacco: Never  Vaping Use   Vaping status: Never Used  Substance and Sexual Activity   Alcohol use: Not Currently    Comment: occasional wine   Drug use: No   Sexual activity: Not Currently    Partners: Male    Birth control/protection: Post-menopausal  Other Topics Concern   Not on file  Social History Narrative   Divorced (ex husband later passed- they were close) Lives alone.  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      Hobbies: painting- enjoys classes, writing, photography, sewing   Social Drivers of Corporate investment banker Strain: Low Risk  (02/04/2024)   Overall Financial Resource Strain (CARDIA)    Difficulty of Paying Living Expenses: Not hard at all  Food Insecurity: No Food Insecurity (02/04/2024)   Hunger Vital Sign    Worried About Running Out of Food in the Last Year: Never true    Ran Out of Food in the Last Year: Never true  Transportation Needs: No Transportation Needs (02/04/2024)   PRAPARE - Administrator, Civil Service (Medical): No    Lack of Transportation (Non-Medical): No  Physical Activity: Insufficiently Active (02/04/2024)   Exercise Vital Sign    Days of Exercise per Week: 1 day    Minutes of Exercise per Session: 10 min  Stress: No Stress Concern Present (02/04/2024)   [employer] of Occupational Health - Occupational Stress Questionnaire    Feeling of Stress : Not at all  Social Connections: Moderately Isolated (02/04/2024)   Social  Connection and Isolation Panel [NHANES]    Frequency of Communication with Friends and Family: Three times a week    Frequency of Social Gatherings with Friends and Family: Three times a week    Attends Religious Services: More than 4 times per year    Active Member of Clubs or Organizations: No    Attends Banker Meetings: Never    Marital Status: Divorced    Tobacco Counseling Counseling given: Not Answered    Clinical Intake:  Pre-visit preparation completed: Yes  Pain : No/denies pain     BMI - recorded: 29.01 Nutritional Status: BMI 25 -29 Overweight Diabetes: Yes CBG done?: No Did pt. bring in CBG monitor from home?: No  Lab Results  Component Value Date   HGBA1C 6.8 (H) 01/06/2024   HGBA1C 6.4 06/10/2023   HGBA1C 6.5 11/21/2022     How often do you need to have someone help you when you read instructions, pamphlets, or other written materials from your doctor or pharmacy?: 1 - Never  Interpreter Needed?: No  Information entered by :: Lamont Pilsner, LPN   Activities of Daily Living     02/04/2024    8:07 AM  In your present state of health, do you have any difficulty performing the following activities:  Hearing? 0  Vision? 0  Difficulty concentrating or making decisions? 0  Walking or climbing stairs? 0  Dressing or bathing? 0  Doing errands, shopping? 0  Preparing Food and eating ? N  Using the Toilet? N  In the past six months, have you accidently leaked urine? N  Do you have problems with loss of bowel control? N  Managing your Medications? N  Managing your Finances? N  Housekeeping or managing your Housekeeping? N    Patient Care Team: Almira Jaeger, MD as PCP - General (Family Medicine) Wanita Gutta, MD as Consulting Physician (Obstetrics and Gynecology) Lizzie Riis, DPM as Consulting Physician (Podiatry) Burden, Lacinda Pica, MD as Consulting Physician (Ophthalmology) Eduardo Grade, MD as Consulting Physician  (Dermatology)  Indicate any recent Medical Services you may have received from other than  [facility] providers in the past year (date may be approximate).     Assessment:    This is a routine wellness examination for Lakelynn.  Hearing/Vision screen Hearing Screening - Comments:: Pt denies any hearing issues  Vision Screening - Comments:: Wears rx glasses - up to date with routine eye exams with Dr Jorge Newcomer     Goals Addressed             This Visit's Progress    Patient Stated       Each day, aim for 6 glasses of water, plenty of protein in your diet and try to get up and walk/ stretch every hour for 5-10 minutes at a time.         Depression Screen     02/04/2024    8:18 AM 09/04/2023    4:02 PM 05/29/2023    2:01 PM 11/28/2022    2:52 PM 11/21/2022   12:59 PM 11/08/2021    8:12 AM 04/16/2021    3:48 PM  PHQ 2/9 Scores  PHQ - 2 Score 0 0 0 0 0 0 0  PHQ- 9 Score  0  0 0      Fall Risk     02/04/2024    8:20 AM 09/04/2023    4:02 PM 05/29/2023    2:01 PM 11/28/2022   12:00 PM 11/21/2022   12:59 PM  Fall Risk   Falls in the past year? 0 0 0 0 0  Number falls in past yr: 0 0 0 0 0  Injury with Fall? 0 0 0  0  Risk for fall due to : No Fall Risks No Fall Risks No Fall Risks Impaired vision No Fall Risks  Follow up Falls evaluation completed Falls evaluation completed Falls evaluation completed Falls prevention discussed Falls evaluation completed    MEDICARE RISK AT HOME:  Medicare Risk at Home Any stairs in or around the home?: Yes If so, are there any without handrails?: No Home free of loose throw rugs in walkways, pet beds, electrical cords, etc?: Yes Adequate lighting in your home to reduce risk of falls?: Yes Life alert?: No Use of a cane, walker or w/c?: Yes (staph for walking distance) Grab bars in the bathroom?: No Shower chair or bench in shower?: Yes Elevated toilet seat or a handicapped toilet?: No  TIMED UP AND GO:  Was the test performed?  No  Cognitive  Function: 6CIT completed        02/04/2024    8:21 AM 11/28/2022    2:55 PM 11/08/2021    8:21 AM 11/02/2020    2:20 PM 07/27/2019    3:26 PM  6CIT Screen  What Year? 0 points 0 points 0 points 0 points 0 points  What month? 0 points 0 points 0 points 0 points 0 points  What time? 0 points 0 points 0 points  0 points  Count back from 20 0 points 0 points 0 points 0 points 0 points  Months in reverse 0 points 0 points 0 points 0 points 0 points  Repeat phrase 0 points 0 points 0 points 0 points 0 points  Total Score 0 points 0 points 0 points  0 points    Immunizations Immunization History  Administered Date(s) Administered   Pneumococcal Conjugate-13 08/09/2014   Pneumococcal Polysaccharide-23 10/19/2015   Tdap 06/02/2023    Screening Tests Health Maintenance  Topic Date Due   Zoster Vaccines- Shingrix (1 of 2) Never done   OPHTHALMOLOGY EXAM  12/04/2023   INFLUENZA VACCINE  04/16/2024   FOOT EXAM  05/28/2024   HEMOGLOBIN A1C  07/07/2024   Medicare Annual Wellness (AWV)  02/03/2025   Pneumonia Vaccine 60+ Years old  Completed   DEXA SCAN  Completed   HPV VACCINES  Aged Out   Meningococcal B Vaccine  Aged Out   DTaP/Tdap/Td  Discontinued   COVID-19 Vaccine  Discontinued    Health Maintenance  Health Maintenance Due  Topic Date Due   Zoster Vaccines- Shingrix (1 of 2) Never done   OPHTHALMOLOGY EXAM  12/04/2023   Health Maintenance Items Addressed: See Nurse Notes  Additional Screening:  Vision Screening: Recommended annual ophthalmology exams for early detection of glaucoma and other disorders of the eye.  Dental Screening: Recommended annual dental exams for proper oral hygiene  Community Resource Referral / Chronic Care Management: CRR required this visit?  No   CCM required this visit?  No   Plan:    I have personally reviewed and noted the following in the patient's chart:   Medical and social history Use of alcohol, tobacco or illicit drugs   Current medications and supplements including opioid prescriptions. Patient is not currently taking opioid prescriptions. Functional ability and status Nutritional status Physical activity Advanced directives List of other physicians Hospitalizations, surgeries, and ER visits in previous 12 months Vitals Screenings to include cognitive, depression, and falls Referrals and appointments  In addition, I have reviewed and discussed with patient certain preventive protocols, quality metrics, and best practice recommendations. A written personalized care plan for preventive services as well as general preventive health recommendations were provided to patient.   Bruno Capri, LPN   1/61/0960   After Visit Summary: (MyChart) Due to this being a telephonic visit, the after visit summary with patients personalized plan was offered to patient via MyChart   Notes: PCP Follow Up Recommendations: Pt stated after standing for a long time the right heel is tender to touch and aches, pt stated she applied ice and rest with relief. However she is requesting to go to good feet Dr and or a referral to a podiatrist please advise

## 2024-02-04 NOTE — Therapy (Signed)
 OUTPATIENT PHYSICAL THERAPY SHOULDER EVALUATION   Patient Name: Tammie [name] MRN: [id number] DOB:May 26, 1935, 89 y.o., female Today's Date: 02/04/2024  END OF SESSION:  PT End of Session - 02/04/24 1333     Visit Number 1    Number of Visits 12    Date for PT Re-Evaluation 03/17/24    PT Start Time 0106    PT Stop Time 0158    PT Time Calculation (min) 52 min    Activity Tolerance Patient tolerated treatment well    Behavior During Therapy WFL for tasks assessed/performed             Past Medical History:  Diagnosis Date   Abnormal uterine bleeding    Allergic rhinitis    Allergy    seasonal   Anxiety    panic attacks   Anxiety disorder    Asthma    Cataract    left eye surgery to removed   Cataract    right eye   Colorectal cancer (HCC) 1997   T3, N0   Depression    pt states she isn't depressed anymore, continues paxil  daily   Endometriosis    Fibroid    GERD (gastroesophageal reflux disease)    Hemorrhoids    Hyperlipidemia    diet controlled, no meds   Hypertension    PONV (postoperative nausea and vomiting)    Prediabetes    Seasonal allergies    SVD (spontaneous vaginal delivery)    x 3   Thyroid  disease    Urinary incontinence    Past Surgical History:  Procedure Laterality Date   cataract surgery Left    COLECTOMY  1997   SIGMOID COLECTOMY 1997   COLON SURGERY  1997   COLONOSCOPY     DILATATION & CURETTAGE/HYSTEROSCOPY WITH MYOSURE N/A 11/21/2015   Procedure: DILATATION & CURETTAGE/HYSTEROSCOPY WITH MYOSURE;  Surgeon: Wanita Gutta, MD;  Location: [facility];  Service: Gynecology;  Laterality: N/A;   DILATION AND CURETTAGE OF UTERUS     D & C   POLYPECTOMY     TONSILLECTOMY     Patient Active Problem List   Diagnosis Date Noted   Tick bite of thigh, left, initial encounter 02/07/2023   Venous insufficiency 11/15/2021   Hyperlipidemia 01/15/2018   Diabetes mellitus without complication (HCC) 07/15/2017   Vertigo 07/15/2017    Left leg pain 12/20/2016   Hemorrhoids 12/20/2016   Dermatitis 12/20/2016   Osteopenia 10/02/2015   Systolic murmur 06/21/2015   Hypertension    Panic attacks    History of colorectal cancer    Allergic rhinitis    Asthma    GERD 06/22/2009    REFERRING PROVIDER: Diona Franklin DO.  REFERRING DIAG: Pain in left shoulder.  Cervicalgia.  THERAPY DIAG:  Chronic left shoulder pain - Plan: PT plan of care cert/re-cert  Cervicalgia - Plan: PT plan of care cert/re-cert  Other muscle spasm - Plan: PT plan of care cert/re-cert  Stiffness of left shoulder joint - Plan: PT plan of care cert/re-cert  Rationale for Evaluation and Treatment: Rehabilitation  ONSET DATE: ~"About 6 months."  SUBJECTIVE:  SUBJECTIVE STATEMENT: The patient presents to the clinic with c/o left shoulder and neck pain that has been ongoing for about 6 months.  Her pain-level is rated at 3-4/10 and can rise to higher levels.  She has increased pain especially when trying to lie on her left side.  Heat, ice and Advil help decrease pain.    PERTINENT HISTORY: See above.  PAIN:  Are you having pain? Yes: NPRS scale: 3-4/10. Pain location: Left shoulder. Pain description: Ache. Aggravating factors: Lying on side. Relieving factors: As above.  PRECAUTIONS: None  RED FLAGS: None   WEIGHT BEARING RESTRICTIONS: No  FALLS:  Has patient fallen in last 6 months? No  LIVING ENVIRONMENT: Lives in: House/apartment  Has following equipment at home: None   PLOF: Independent  PATIENT GOALS:Reduce pain and be able to lie on side  NEXT MD VISIT:   OBJECTIVE:  Note: Objective measures were completed at Evaluation unless otherwise noted.  DIAGNOSTIC FINDINGS:  No fractures. There is mild narrowing of the C4-C5, C5-C6, C6-C7 disc  spaces minimal anterior osteophytic formation C5-C6 and C6-C7 mild posterior bony spondylosis with mild C5-6 foraminal narrowing, left more than right foraminal narrowing. C1-C2 articulation and predental space within normal limits   IMPRESSION: Mild degenerative changes of the cervical spine as described.  There is no evidence of fracture or dislocation. There is no evidence of arthropathy or other focal bone abnormality. Soft tissues are unremarkable.   IMPRESSION: Negative.    PATIENT SURVEYS:  Quick Dash 27.27   POSTURE: Forward head and rounded shoulders.  UPPER EXTREMITY ROM:   Active left shoulder flexion to 140 degrees and ER to 70 degrees (left is 90 degrees).  Bilateral active cervical rotation is 55 degrees.  UPPER EXTREMITY MMT:  Left shoulder ER/IR is a solid 4 to 4+/5.    SHOULDER SPECIAL TESTS: No pain with left shoulder impingement testing   PALPATION:  Tender to palpation over left UT and middle deltoid.  DTR's  Normal UE DTR's.                                                                                                                             TREATMENT DATE: 02/04/24:  HMP and IFC at 80-150 Hz on 40% scan x 20 minutes to patient's left UT and middle deltoid region.  Normal modality response following removal of modality.   PATIENT EDUCATION: Education details: Chin tuck and extension (patient states Dr. Donelda Fujita had also instructed her in this). Person educated: Patient Education method: Medical illustrator Education comprehension: verbalized understanding and returned demonstration  HOME EXERCISE PROGRAM: As above.    ASSESSMENT:  CLINICAL IMPRESSION: The patient presents to OPPT with c/o left-sided neck  and shoulder pain.  She is tender to palpation over left UT and middle deltoid.  Her UE DTR's are normal.  She has a minimal strength deficit into left shoulder IR/ER.  She demonstrated a negative impingement test.  She has  decreased left shoulder ER per contralateral.  Patient will benefit from skilled physical therapy intervention to address pain and deficits.   OBJECTIVE IMPAIRMENTS: decreased activity tolerance, decreased ROM, decreased strength, increased muscle spasms, and pain.   ACTIVITY LIMITATIONS: carrying, lifting, and sleeping  PARTICIPATION LIMITATIONS: meal prep, cleaning, and laundry  PERSONAL FACTORS: Time since onset of injury/illness/exacerbation are also affecting patient's functional outcome.   REHAB POTENTIAL: Good  CLINICAL DECISION MAKING: Evolving/moderate complexity  EVALUATION COMPLEXITY: Moderate   GOALS:  SHORT TERM GOALS: Target date: 02/18/24.  Ind with an initial HEP. Goal status: INITIAL   LONG TERM GOALS: Target date: 03/17/24.  Ind with an advanced HEP.  Goal status: INITIAL  2.  Improve active left shoulder ER to 80 degrees+  Goal status: INITIAL  3.  Improve bilateral active cervical rotation to 65 degrees so she can more easily turn her head while driving.  Goal status: INITIAL  4.  Patient able to sleep on her side.   Baseline:  Goal status: INITIAL  5.  Perform ADL's with pain not > 3/10.  Goal status: INITIAL  PLAN:  PT FREQUENCY: 2x/week  PT DURATION: 6 weeks  PLANNED INTERVENTIONS: 97110-Therapeutic exercises, 97530- Therapeutic activity, [id number]- Neuromuscular re-education, 97535- Self Care, 21308- Manual therapy, [id number]- Electrical stimulation (unattended), 97016- Vasopneumatic device, 97035- Ultrasound, Patient/Family education, Dry Needling, Cryotherapy, and Moist heat  PLAN FOR NEXT SESSION: Chin tucks and extension, corner stretch, RW4, combo e'stim/US , STW/M.   Paeton Latouche, [place], PT 02/04/2024, 4:01 PM

## 2024-02-04 NOTE — Patient Instructions (Signed)
 Tammie [name] , Thank you for taking time out of your busy schedule to complete your Annual Wellness Visit with me. I enjoyed our conversation and look forward to speaking with you again next year. I, as well as your care team,  appreciate your ongoing commitment to your health goals. Please review the following plan we discussed and let me know if I can assist you in the future. Your Game plan/ To Do List    Referrals: If you haven't heard from the office you've been referred to, please reach out to them at the phone provided.   Follow up Visits: Next Medicare AWV with our clinical staff: 02/10/25   Have you seen your provider in the last 6 months (3 months if uncontrolled diabetes)? Yes Next Office Visit with your provider: 07/01/24  Clinician Recommendations:  Each day, aim for 6 glasses of water, plenty of protein in your diet and try to get up and walk/ stretch every hour for 5-10 minutes at a time.        This is a list of the screening recommended for you and due dates:  Health Maintenance  Topic Date Due   Zoster (Shingles) Vaccine (1 of 2) Never done   Medicare Annual Wellness Visit  11/28/2023   Eye exam for diabetics  12/04/2023   Flu Shot  04/16/2024   Complete foot exam   05/28/2024   Hemoglobin A1C  07/07/2024   Pneumonia Vaccine  Completed   DEXA scan (bone density measurement)  Completed   HPV Vaccine  Aged Out   Meningitis B Vaccine  Aged Out   DTaP/Tdap/Td vaccine  Discontinued   COVID-19 Vaccine  Discontinued    Advanced directives: (Copy Requested) Please bring a copy of your health care power of attorney and living will to the office to be added to your chart at your convenience. You can mail to [facility] [address] [place], [place] [address] or email to [email or web address] Advance Care Planning is important because it:  [x]  Makes sure you receive the medical care that is consistent with your values, goals, and preferences  [x]  It  provides guidance to your family and loved ones and reduces their decisional burden about whether or not they are making the right decisions based on your wishes.  Follow the link provided in your after visit summary or read over the paperwork we have mailed to you to help you started getting your Advance Directives in place. If you need assistance in completing these, please reach out to us  so that we can help you!  See attachments for Preventive Care and Fall Prevention Tips.

## 2024-02-11 ENCOUNTER — Inpatient Hospital Stay: Admission: RE | Admit: 2024-02-11 | Source: Ambulatory Visit

## 2024-02-11 ENCOUNTER — Ambulatory Visit: Admitting: Physical Therapy

## 2024-02-11 DIAGNOSIS — M25512 Pain in left shoulder: Secondary | ICD-10-CM | POA: Diagnosis not present

## 2024-02-11 DIAGNOSIS — M62838 Other muscle spasm: Secondary | ICD-10-CM

## 2024-02-11 DIAGNOSIS — M542 Cervicalgia: Secondary | ICD-10-CM

## 2024-02-11 DIAGNOSIS — G8929 Other chronic pain: Secondary | ICD-10-CM

## 2024-02-11 NOTE — Therapy (Signed)
 OUTPATIENT PHYSICAL THERAPY SHOULDER TREATMENT   Patient Name: Tammie [name] MRN: [id number] DOB:1935-04-27, 89 y.o., female Today's Date: 02/11/2024  END OF SESSION:  PT End of Session - 02/11/24 1535     Visit Number 2    Number of Visits 12    Date for PT Re-Evaluation 03/17/24    PT Start Time 0230    PT Stop Time 0322    PT Time Calculation (min) 52 min              Past Medical History:  Diagnosis Date   Abnormal uterine bleeding    Allergic rhinitis    Allergy    seasonal   Anxiety    panic attacks   Anxiety disorder    Asthma    Cataract    left eye surgery to removed   Cataract    right eye   Colorectal cancer (HCC) 1997   T3, N0   Depression    pt states she isn't depressed anymore, continues paxil  daily   Endometriosis    Fibroid    GERD (gastroesophageal reflux disease)    Hemorrhoids    Hyperlipidemia    diet controlled, no meds   Hypertension    PONV (postoperative nausea and vomiting)    Prediabetes    Seasonal allergies    SVD (spontaneous vaginal delivery)    x 3   Thyroid  disease    Urinary incontinence    Past Surgical History:  Procedure Laterality Date   cataract surgery Left    COLECTOMY  1997   SIGMOID COLECTOMY 1997   COLON SURGERY  1997   COLONOSCOPY     DILATATION & CURETTAGE/HYSTEROSCOPY WITH MYOSURE N/A 11/21/2015   Procedure: DILATATION & CURETTAGE/HYSTEROSCOPY WITH MYOSURE;  Surgeon: Wanita Gutta, MD;  Location: [facility];  Service: Gynecology;  Laterality: N/A;   DILATION AND CURETTAGE OF UTERUS     D & C   POLYPECTOMY     TONSILLECTOMY     Patient Active Problem List   Diagnosis Date Noted   Tick bite of thigh, left, initial encounter 02/07/2023   Venous insufficiency 11/15/2021   Hyperlipidemia 01/15/2018   Diabetes mellitus without complication (HCC) 07/15/2017   Vertigo 07/15/2017   Left leg pain 12/20/2016   Hemorrhoids 12/20/2016   Dermatitis 12/20/2016   Osteopenia 10/02/2015   Systolic murmur  40/98/1191   Hypertension    Panic attacks    History of colorectal cancer    Allergic rhinitis    Asthma    GERD 06/22/2009    REFERRING PROVIDER: Diona Franklin DO.  REFERRING DIAG: Pain in left shoulder.  Cervicalgia.  THERAPY DIAG:  Chronic left shoulder pain  Cervicalgia  Other muscle spasm  Rationale for Evaluation and Treatment: Rehabilitation  ONSET DATE: ~"About 6 months."  SUBJECTIVE:  SUBJECTIVE STATEMENT: Neck pain about a 4.    PERTINENT HISTORY: See above.  PAIN:  Are you having pain? Yes: NPRS scale: 3-4/10. Pain location: Left shoulder. Pain description: Ache. Aggravating factors: Lying on side. Relieving factors: As above.  PRECAUTIONS: None  RED FLAGS: None   WEIGHT BEARING RESTRICTIONS: No  FALLS:  Has patient fallen in last 6 months? No  LIVING ENVIRONMENT: Lives in: House/apartment  Has following equipment at home: None   PLOF: Independent  PATIENT GOALS:Reduce pain and be able to lie on side  NEXT MD VISIT:   OBJECTIVE:  Note: Objective measures were completed at Evaluation unless otherwise noted.  DIAGNOSTIC FINDINGS:  No fractures. There is mild narrowing of the C4-C5, C5-C6, C6-C7 disc spaces minimal anterior osteophytic formation C5-C6 and C6-C7 mild posterior bony spondylosis with mild C5-6 foraminal narrowing, left more than right foraminal narrowing. C1-C2 articulation and predental space within normal limits   IMPRESSION: Mild degenerative changes of the cervical spine as described.  There is no evidence of fracture or dislocation. There is no evidence of arthropathy or other focal bone abnormality. Soft tissues are unremarkable.   IMPRESSION: Negative.    PATIENT SURVEYS:  Quick Dash 27.27   POSTURE: Forward head and rounded  shoulders.  UPPER EXTREMITY ROM:   Active left shoulder flexion to 140 degrees and ER to 70 degrees (left is 90 degrees).  Bilateral active cervical rotation is 55 degrees.  UPPER EXTREMITY MMT:  Left shoulder ER/IR is a solid 4 to 4+/5.    SHOULDER SPECIAL TESTS: No pain with left shoulder impingement testing   PALPATION:  Tender to palpation over left UT and middle deltoid.  DTR's  Normal UE DTR's.                                                                                                                             TREATMENT DATE:   02/11/24:   Combo e'stim/US  at 1.50 W/CM2 x 12 minutes f/b STW/M  to patient's left UT and middle deltoid x 12 minutes f/b HMP and IFC at 80-150 Hz on 40% scan x 20 minutes to patient's left UT and middle deltoid region.  Normal modality response following removal of modality.  02/04/24:  HMP and IFC at 80-150 Hz on 40% scan x 20 minutes to patient's left UT and middle deltoid region.  Normal modality response following removal of modality.   PATIENT EDUCATION: Education details: Chin tuck and extension (patient states Dr. Donelda Fujita had also instructed her in this). Person educated: Patient Education method: Medical illustrator Education comprehension: verbalized understanding and returned demonstration  HOME EXERCISE PROGRAM: As above.    ASSESSMENT:  CLINICAL IMPRESSION: Patient did well with treatment today with focus on left UT and middle deltoid region and felt much better afterwards.    OBJECTIVE IMPAIRMENTS: decreased activity tolerance, decreased ROM, decreased strength, increased muscle spasms, and pain.   ACTIVITY LIMITATIONS: carrying, lifting, and sleeping  PARTICIPATION LIMITATIONS:  meal prep, cleaning, and laundry  PERSONAL FACTORS: Time since onset of injury/illness/exacerbation are also affecting patient's functional outcome.   REHAB POTENTIAL: Good  CLINICAL DECISION MAKING: Evolving/moderate  complexity  EVALUATION COMPLEXITY: Moderate   GOALS:  SHORT TERM GOALS: Target date: 02/18/24.  Ind with an initial HEP. Goal status: INITIAL   LONG TERM GOALS: Target date: 03/17/24.  Ind with an advanced HEP.  Goal status: INITIAL  2.  Improve active left shoulder ER to 80 degrees+  Goal status: INITIAL  3.  Improve bilateral active cervical rotation to 65 degrees so she can more easily turn her head while driving.  Goal status: INITIAL  4.  Patient able to sleep on her side.   Baseline:  Goal status: INITIAL  5.  Perform ADL's with pain not > 3/10.  Goal status: INITIAL  PLAN:  PT FREQUENCY: 2x/week  PT DURATION: 6 weeks  PLANNED INTERVENTIONS: 97110-Therapeutic exercises, 97530- Therapeutic activity, [id number]- Neuromuscular re-education, 97535- Self Care, [id number]- Manual therapy, [id number]- Electrical stimulation (unattended), 97016- Vasopneumatic device, 97035- Ultrasound, Patient/Family education, Dry Needling, Cryotherapy, and Moist heat  PLAN FOR NEXT SESSION: Chin tucks and extension, corner stretch, RW4, combo e'stim/US , STW/M.   Darlena Koval, [place], PT 02/11/2024, 3:41 PM

## 2024-02-20 ENCOUNTER — Ambulatory Visit: Attending: Sports Medicine | Admitting: Physical Therapy

## 2024-02-20 DIAGNOSIS — M25571 Pain in right ankle and joints of right foot: Secondary | ICD-10-CM | POA: Diagnosis not present

## 2024-02-20 DIAGNOSIS — M542 Cervicalgia: Secondary | ICD-10-CM | POA: Diagnosis not present

## 2024-02-20 DIAGNOSIS — G8929 Other chronic pain: Secondary | ICD-10-CM

## 2024-02-20 DIAGNOSIS — M25612 Stiffness of left shoulder, not elsewhere classified: Secondary | ICD-10-CM

## 2024-02-20 DIAGNOSIS — M25512 Pain in left shoulder: Secondary | ICD-10-CM | POA: Insufficient documentation

## 2024-02-20 DIAGNOSIS — M62838 Other muscle spasm: Secondary | ICD-10-CM

## 2024-02-20 NOTE — Therapy (Signed)
 OUTPATIENT PHYSICAL THERAPY SHOULDER TREATMENT   Patient Name: Tammie [name] MRN: [id number] DOB:[birth year], 89 y.o., female Today's Date: 02/20/2024  END OF SESSION:  PT End of Session - 02/20/24 1242     Visit Number 3    Number of Visits 12    Date for PT Re-Evaluation 03/17/24    PT Start Time 1125    PT Stop Time 1221    PT Time Calculation (min) 56 min    Activity Tolerance Patient tolerated treatment well    Behavior During Therapy WFL for tasks assessed/performed              Past Medical History:  Diagnosis Date   Abnormal uterine bleeding    Allergic rhinitis    Allergy    seasonal   Anxiety    panic attacks   Anxiety disorder    Asthma    Cataract    left eye surgery to removed   Cataract    right eye   Colorectal cancer (HCC) 1997   T3, N0   Depression    pt states she isn't depressed anymore, continues paxil  daily   Endometriosis    Fibroid    GERD (gastroesophageal reflux disease)    Hemorrhoids    Hyperlipidemia    diet controlled, no meds   Hypertension    PONV (postoperative nausea and vomiting)    Prediabetes    Seasonal allergies    SVD (spontaneous vaginal delivery)    x 3   Thyroid  disease    Urinary incontinence    Past Surgical History:  Procedure Laterality Date   cataract surgery Left    COLECTOMY  1997   SIGMOID COLECTOMY 1997   COLON SURGERY  1997   COLONOSCOPY     DILATATION & CURETTAGE/HYSTEROSCOPY WITH MYOSURE N/A 11/21/2015   Procedure: DILATATION & CURETTAGE/HYSTEROSCOPY WITH MYOSURE;  Surgeon: Wanita Gutta, MD;  Location: [facility];  Service: Gynecology;  Laterality: N/A;   DILATION AND CURETTAGE OF UTERUS     D & C   POLYPECTOMY     TONSILLECTOMY     Patient Active Problem List   Diagnosis Date Noted   Tick bite of thigh, left, initial encounter 02/07/2023   Venous insufficiency 11/15/2021   Hyperlipidemia 01/15/2018   Diabetes mellitus without complication (HCC) 07/15/2017   Vertigo 07/15/2017    Left leg pain 12/20/2016   Hemorrhoids 12/20/2016   Dermatitis 12/20/2016   Osteopenia 10/02/2015   Systolic murmur 06/21/2015   Hypertension    Panic attacks    History of colorectal cancer    Allergic rhinitis    Asthma    GERD 06/22/2009    REFERRING PROVIDER: Diona Franklin DO.  REFERRING DIAG: Pain in left shoulder.  Cervicalgia.  THERAPY DIAG:  Chronic left shoulder pain  Cervicalgia  Other muscle spasm  Stiffness of left shoulder joint  Rationale for Evaluation and Treatment: Rehabilitation  ONSET DATE: ~"About 6 months."  SUBJECTIVE:  SUBJECTIVE STATEMENT: Left side is feeling much better.  Having some right-sided neck pain.   PERTINENT HISTORY: See above.  PAIN:  Are you having pain? Yes: NPRS scale: 3-4/10. Pain location: Left shoulder. Pain description: Ache. Aggravating factors: Lying on side. Relieving factors: As above.  PRECAUTIONS: None  RED FLAGS: None   WEIGHT BEARING RESTRICTIONS: No  FALLS:  Has patient fallen in last 6 months? No  LIVING ENVIRONMENT: Lives in: House/apartment  Has following equipment at home: None   PLOF: Independent  PATIENT GOALS:Reduce pain and be able to lie on side  NEXT MD VISIT:   OBJECTIVE:  Note: Objective measures were completed at Evaluation unless otherwise noted.  DIAGNOSTIC FINDINGS:  No fractures. There is mild narrowing of the C4-C5, C5-C6, C6-C7 disc spaces minimal anterior osteophytic formation C5-C6 and C6-C7 mild posterior bony spondylosis with mild C5-6 foraminal narrowing, left more than right foraminal narrowing. C1-C2 articulation and predental space within normal limits   IMPRESSION: Mild degenerative changes of the cervical spine as described.  There is no evidence of fracture or dislocation. There  is no evidence of arthropathy or other focal bone abnormality. Soft tissues are unremarkable.   IMPRESSION: Negative.    PATIENT SURVEYS:  Quick Dash 27.27   POSTURE: Forward head and rounded shoulders.  UPPER EXTREMITY ROM:   Active left shoulder flexion to 140 degrees and ER to 70 degrees (left is 90 degrees).  Bilateral active cervical rotation is 55 degrees.  UPPER EXTREMITY MMT:  Left shoulder ER/IR is a solid 4 to 4+/5.    SHOULDER SPECIAL TESTS: No pain with left shoulder impingement testing   PALPATION:  Tender to palpation over left UT and middle deltoid.  DTR's  Normal UE DTR's.                                                                                                                             TREATMENT DATE:    02/20/24:  Combo e'stim/US  at 1.50 W/CM2 x 12 minutes to bilateral UT's (6 minutes each side) f/b STW/M  to patient's bilateral UT's x 12 minutes f/b HMP and IFC at 80-150 Hz on 40% scan x 20 minutes to patient's left UT and middle deltoid region.  Normal modality response following removal of modality.  02/11/24:   Combo e'stim/US  at 1.50 W/CM2 x 12 minutes f/b STW/M  to patient's left UT and middle deltoid x 12 minutes f/b HMP and IFC at 80-150 Hz on 40% scan x 20 minutes to patient's left UT and middle deltoid region.  Normal modality response following removal of modality.  02/04/24:  HMP and IFC at 80-150 Hz on 40% scan x 20 minutes to patient's left UT and middle deltoid region.  Normal modality response following removal of modality.   PATIENT EDUCATION: Education details: Chin tuck and extension (patient states Dr. Donelda Fujita had also instructed her in this). Person educated: Patient Education method: Medical illustrator Education comprehension: verbalized understanding  and returned demonstration  HOME EXERCISE PROGRAM: As above.    ASSESSMENT:  CLINICAL IMPRESSION: Patient very pleased with her progress and states the left  side of her neck is feeling much better.  She had c/o right-sided neck pain today and had a very good response to soft tissue work today.  OBJECTIVE IMPAIRMENTS: decreased activity tolerance, decreased ROM, decreased strength, increased muscle spasms, and pain.   ACTIVITY LIMITATIONS: carrying, lifting, and sleeping  PARTICIPATION LIMITATIONS: meal prep, cleaning, and laundry  PERSONAL FACTORS: Time since onset of injury/illness/exacerbation are also affecting patient's functional outcome.   REHAB POTENTIAL: Good  CLINICAL DECISION MAKING: Evolving/moderate complexity  EVALUATION COMPLEXITY: Moderate   GOALS:  SHORT TERM GOALS: Target date: 02/18/24.  Ind with an initial HEP. Goal status: INITIAL   LONG TERM GOALS: Target date: 03/17/24.  Ind with an advanced HEP.  Goal status: INITIAL  2.  Improve active left shoulder ER to 80 degrees+  Goal status: INITIAL  3.  Improve bilateral active cervical rotation to 65 degrees so she can more easily turn her head while driving.  Goal status: INITIAL  4.  Patient able to sleep on her side.   Baseline:  Goal status: INITIAL  5.  Perform ADL's with pain not > 3/10.  Goal status: INITIAL  PLAN:  PT FREQUENCY: 2x/week  PT DURATION: 6 weeks  PLANNED INTERVENTIONS: 97110-Therapeutic exercises, 97530- Therapeutic activity, [id number]- Neuromuscular re-education, 97535- Self Care, 16109- Manual therapy, G0283- Electrical stimulation (unattended), 97016- Vasopneumatic device, 97035- Ultrasound, Patient/Family education, Dry Needling, Cryotherapy, and Moist heat  PLAN FOR NEXT SESSION: Chin tucks and extension, corner stretch, RW4, combo e'stim/US , STW/M.   Elizaveta Mattice, [place], PT 02/20/2024, 12:48 PM

## 2024-02-26 ENCOUNTER — Ambulatory Visit: Admitting: Physical Therapy

## 2024-02-26 DIAGNOSIS — G8929 Other chronic pain: Secondary | ICD-10-CM

## 2024-02-26 DIAGNOSIS — M25512 Pain in left shoulder: Secondary | ICD-10-CM | POA: Diagnosis not present

## 2024-02-26 DIAGNOSIS — M25612 Stiffness of left shoulder, not elsewhere classified: Secondary | ICD-10-CM

## 2024-02-26 DIAGNOSIS — M62838 Other muscle spasm: Secondary | ICD-10-CM

## 2024-02-26 DIAGNOSIS — M542 Cervicalgia: Secondary | ICD-10-CM

## 2024-02-26 NOTE — Therapy (Signed)
 OUTPATIENT PHYSICAL THERAPY SHOULDER TREATMENT   Patient Name: Tammie [name] MRN: [id number] DOB:1934-10-28, 89 y.o., female Today's Date: 02/26/2024  END OF SESSION:  PT End of Session - 02/26/24 1620     Visit Number 4    Date for PT Re-Evaluation 03/17/24    PT Start Time 0315    PT Stop Time 0408    PT Time Calculation (min) 53 min    Activity Tolerance Patient tolerated treatment well    Behavior During Therapy WFL for tasks assessed/performed            Past Medical History:  Diagnosis Date   Abnormal uterine bleeding    Allergic rhinitis    Allergy    seasonal   Anxiety    panic attacks   Anxiety disorder    Asthma    Cataract    left eye surgery to removed   Cataract    right eye   Colorectal cancer (HCC) 1997   T3, N0   Depression    pt states she isn't depressed anymore, continues paxil  daily   Endometriosis    Fibroid    GERD (gastroesophageal reflux disease)    Hemorrhoids    Hyperlipidemia    diet controlled, no meds   Hypertension    PONV (postoperative nausea and vomiting)    Prediabetes    Seasonal allergies    SVD (spontaneous vaginal delivery)    x 3   Thyroid  disease    Urinary incontinence    Past Surgical History:  Procedure Laterality Date   cataract surgery Left    COLECTOMY  1997   SIGMOID COLECTOMY 1997   COLON SURGERY  1997   COLONOSCOPY     DILATATION & CURETTAGE/HYSTEROSCOPY WITH MYOSURE N/A 11/21/2015   Procedure: DILATATION & CURETTAGE/HYSTEROSCOPY WITH MYOSURE;  Surgeon: Wanita Gutta, MD;  Location: [facility];  Service: Gynecology;  Laterality: N/A;   DILATION AND CURETTAGE OF UTERUS     D & C   POLYPECTOMY     TONSILLECTOMY     Patient Active Problem List   Diagnosis Date Noted   Tick bite of thigh, left, initial encounter 02/07/2023   Venous insufficiency 11/15/2021   Hyperlipidemia 01/15/2018   Diabetes mellitus without complication (HCC) 07/15/2017   Vertigo 07/15/2017   Left leg pain 12/20/2016    Hemorrhoids 12/20/2016   Dermatitis 12/20/2016   Osteopenia 10/02/2015   Systolic murmur 06/21/2015   Hypertension    Panic attacks    History of colorectal cancer    Allergic rhinitis    Asthma    GERD 06/22/2009    REFERRING PROVIDER: Diona Franklin DO.  REFERRING DIAG: Pain in left shoulder.  Cervicalgia.  THERAPY DIAG:  Chronic left shoulder pain  Cervicalgia  Other muscle spasm  Stiffness of left shoulder joint  Rationale for Evaluation and Treatment: Rehabilitation  ONSET DATE: ~About 6 months.  SUBJECTIVE:  SUBJECTIVE STATEMENT: Doing better. PERTINENT HISTORY: See above.  PAIN:  Are you having pain? Yes: NPRS scale: 3-5/10. Pain location: Left shoulder. Pain description: Ache. Aggravating factors: Lying on side. Relieving factors: As above.  PRECAUTIONS: None  RED FLAGS: None   WEIGHT BEARING RESTRICTIONS: No  FALLS:  Has patient fallen in last 6 months? No  LIVING ENVIRONMENT: Lives in: House/apartment  Has following equipment at home: None   PLOF: Independent  PATIENT GOALS:Reduce pain and be able to lie on side  NEXT MD VISIT:   OBJECTIVE:  Note: Objective measures were completed at Evaluation unless otherwise noted.  DIAGNOSTIC FINDINGS:  No fractures. There is mild narrowing of the C4-C5, C5-C6, C6-C7 disc spaces minimal anterior osteophytic formation C5-C6 and C6-C7 mild posterior bony spondylosis with mild C5-6 foraminal narrowing, left more than right foraminal narrowing. C1-C2 articulation and predental space within normal limits   IMPRESSION: Mild degenerative changes of the cervical spine as described.  There is no evidence of fracture or dislocation. There is no evidence of arthropathy or other focal bone abnormality. Soft tissues are  unremarkable.   IMPRESSION: Negative.    PATIENT SURVEYS:  Quick Dash 27.27   POSTURE: Forward head and rounded shoulders.  UPPER EXTREMITY ROM:   Active left shoulder flexion to 140 degrees and ER to 70 degrees (left is 90 degrees).  Bilateral active cervical rotation is 55 degrees.  UPPER EXTREMITY MMT:  Left shoulder ER/IR is a solid 4 to 4+/5.    SHOULDER SPECIAL TESTS: No pain with left shoulder impingement testing   PALPATION:  Tender to palpation over left UT and middle deltoid.  DTR's  Normal UE DTR's.                                                                                                                             TREATMENT DATE:   02/26/24:  Combo e'stim/US  at 1.50 W/CM2 x 12 minutes to patient's left UT f/b STW/M x 12 minutes f/b IFC at 80-150 Hz on 40% scan x 20 minutes to patient's left UT and middle deltoid region.  Normal modality response following removal of modality.   02/20/24:  Combo e'stim/US  at 1.50 W/CM2 x 12 minutes to bilateral UT's (6 minutes each side) f/b STW/M  to patient's bilateral UT's x 12 minutes f/b HMP and IFC at 80-150 Hz on 40% scan x 20 minutes to patient's left UT and middle deltoid region.  Normal modality response following removal of modality.    PATIENT EDUCATION: Education details: See below. Person educated: Patient Education method: Medical illustrator, handout Education comprehension: verbalized understanding and returned demonstration  HOME EXERCISE PROGRAM:  HOME EXERCISE PROGRAM [ESNSCR2]  PECTORALIS CORNER STRETCH -  Repeat 4 Repetitions, Hold 30 Seconds, Complete 1 Set, Perform 4 Times a Day  90 degree Pec stretch -  Repeat 4 Repetitions, Hold 30 Seconds, Complete 2 Sets, Perform 4 Times a Day  ASSESSMENT:  CLINICAL IMPRESSION: Patient very  pleased with her progress and states the left side of her neck is feeling much better.  Instructed patient in doorway and corner stretch to improve left  shoulder ER.  OBJECTIVE IMPAIRMENTS: decreased activity tolerance, decreased ROM, decreased strength, increased muscle spasms, and pain.   ACTIVITY LIMITATIONS: carrying, lifting, and sleeping  PARTICIPATION LIMITATIONS: meal prep, cleaning, and laundry  PERSONAL FACTORS: Time since onset of injury/illness/exacerbation are also affecting patient's functional outcome.   REHAB POTENTIAL: Good  CLINICAL DECISION MAKING: Evolving/moderate complexity  EVALUATION COMPLEXITY: Moderate   GOALS:  SHORT TERM GOALS: Target date: 02/18/24.  Ind with an initial HEP. Goal status: INITIAL   LONG TERM GOALS: Target date: 03/17/24.  Ind with an advanced HEP.  Goal status: INITIAL  2.  Improve active left shoulder ER to 80 degrees+  Goal status: INITIAL  3.  Improve bilateral active cervical rotation to 65 degrees so she can more easily turn her head while driving.  Goal status: INITIAL  4.  Patient able to sleep on her side.   Baseline:  Goal status: INITIAL  5.  Perform ADL's with pain not > 3/10.  Goal status: INITIAL  PLAN:  PT FREQUENCY: 2x/week  PT DURATION: 6 weeks  PLANNED INTERVENTIONS: 97110-Therapeutic exercises, 97530- Therapeutic activity, [id number]- Neuromuscular re-education, 97535- Self Care, 16109- Manual therapy, [id number]- Electrical stimulation (unattended), 97016- Vasopneumatic device, 97035- Ultrasound, Patient/Family education, Dry Needling, Cryotherapy, and Moist heat  PLAN FOR NEXT SESSION: Chin tucks and extension, corner stretch, RW4, combo e'stim/US , STW/M.   Alyxandra Tenbrink, [place], PT 02/26/2024, 4:26 PM

## 2024-03-02 DIAGNOSIS — M9907 Segmental and somatic dysfunction of upper extremity: Secondary | ICD-10-CM | POA: Diagnosis not present

## 2024-03-02 DIAGNOSIS — M9902 Segmental and somatic dysfunction of thoracic region: Secondary | ICD-10-CM | POA: Diagnosis not present

## 2024-03-02 DIAGNOSIS — M542 Cervicalgia: Secondary | ICD-10-CM | POA: Diagnosis not present

## 2024-03-02 DIAGNOSIS — M9908 Segmental and somatic dysfunction of rib cage: Secondary | ICD-10-CM | POA: Diagnosis not present

## 2024-03-02 DIAGNOSIS — M79661 Pain in right lower leg: Secondary | ICD-10-CM | POA: Diagnosis not present

## 2024-03-02 DIAGNOSIS — M25512 Pain in left shoulder: Secondary | ICD-10-CM | POA: Diagnosis not present

## 2024-03-02 DIAGNOSIS — M7661 Achilles tendinitis, right leg: Secondary | ICD-10-CM | POA: Diagnosis not present

## 2024-03-02 DIAGNOSIS — M9901 Segmental and somatic dysfunction of cervical region: Secondary | ICD-10-CM | POA: Diagnosis not present

## 2024-03-09 ENCOUNTER — Inpatient Hospital Stay: Admission: RE | Admit: 2024-03-09 | Source: Ambulatory Visit

## 2024-03-09 ENCOUNTER — Ambulatory Visit: Admitting: Physical Therapy

## 2024-03-09 DIAGNOSIS — M25571 Pain in right ankle and joints of right foot: Secondary | ICD-10-CM

## 2024-03-09 DIAGNOSIS — M25512 Pain in left shoulder: Secondary | ICD-10-CM | POA: Diagnosis not present

## 2024-03-09 NOTE — Therapy (Signed)
 OUTPATIENT PHYSICAL THERAPY SHOULDER TREATMENT   Patient Name: Tammie [name] MRN: [id number] DOB:Apr 02, 1935, 89 y.o., female Today's Date: 03/09/2024  END OF SESSION:  PT End of Session - 03/09/24 1201     Visit Number 5    Number of Visits 12    Date for PT Re-Evaluation 03/17/24    PT Start Time 1100    PT Stop Time 1150    PT Time Calculation (min) 50 min    Activity Tolerance Patient tolerated treatment well    Behavior During Therapy WFL for tasks assessed/performed            Past Medical History:  Diagnosis Date   Abnormal uterine bleeding    Allergic rhinitis    Allergy    seasonal   Anxiety    panic attacks   Anxiety disorder    Asthma    Cataract    left eye surgery to removed   Cataract    right eye   Colorectal cancer (HCC) 1997   T3, N0   Depression    pt states she isn't depressed anymore, continues paxil  daily   Endometriosis    Fibroid    GERD (gastroesophageal reflux disease)    Hemorrhoids    Hyperlipidemia    diet controlled, no meds   Hypertension    PONV (postoperative nausea and vomiting)    Prediabetes    Seasonal allergies    SVD (spontaneous vaginal delivery)    x 3   Thyroid  disease    Urinary incontinence    Past Surgical History:  Procedure Laterality Date   cataract surgery Left    COLECTOMY  1997   SIGMOID COLECTOMY 1997   COLON SURGERY  1997   COLONOSCOPY     DILATATION & CURETTAGE/HYSTEROSCOPY WITH MYOSURE N/A 11/21/2015   Procedure: DILATATION & CURETTAGE/HYSTEROSCOPY WITH MYOSURE;  Surgeon: Kate Hargis Nearing, MD;  Location: [facility];  Service: Gynecology;  Laterality: N/A;   DILATION AND CURETTAGE OF UTERUS     D & C   POLYPECTOMY     TONSILLECTOMY     Patient Active Problem List   Diagnosis Date Noted   Tick bite of thigh, left, initial encounter 02/07/2023   Venous insufficiency 11/15/2021   Hyperlipidemia 01/15/2018   Diabetes mellitus without complication (HCC) 07/15/2017   Vertigo 07/15/2017   Left  leg pain 12/20/2016   Hemorrhoids 12/20/2016   Dermatitis 12/20/2016   Osteopenia 10/02/2015   Systolic murmur 06/21/2015   Hypertension    Panic attacks    History of colorectal cancer    Allergic rhinitis    Asthma    GERD 06/22/2009    REFERRING PROVIDER: Ozell Jewell DO.  REFERRING DIAG: Pain in left shoulder.  Cervicalgia.  THERAPY DIAG:  Pain in right ankle and joints of right foot  Rationale for Evaluation and Treatment: Rehabilitation  ONSET DATE: ~About 6 months.  SUBJECTIVE:  SUBJECTIVE STATEMENT: Patient with new referral for right Achilles tendonitis.   PERTINENT HISTORY: See above.  PAIN:  Are you having pain? Yes: NPRS scale: 8/10. Pain location: Right ankle. Pain description: Ache. Aggravating factors: Lying on side. Relieving factors: As above.  PRECAUTIONS: None  RED FLAGS: None   WEIGHT BEARING RESTRICTIONS: No  FALLS:  Has patient fallen in last 6 months? No  LIVING ENVIRONMENT: Lives in: House/apartment  Has following equipment at home: None   PLOF: Independent  PATIENT GOALS:Reduce pain and be able to lie on side  NEXT MD VISIT:   OBJECTIVE:  Note: Objective measures were completed at Evaluation unless otherwise noted.  DIAGNOSTIC FINDINGS:  No fractures. There is mild narrowing of the C4-C5, C5-C6, C6-C7 disc spaces minimal anterior osteophytic formation C5-C6 and C6-C7 mild posterior bony spondylosis with mild C5-6 foraminal narrowing, left more than right foraminal narrowing. C1-C2 articulation and predental space within normal limits   IMPRESSION: Mild degenerative changes of the cervical spine as described.  There is no evidence of fracture or dislocation. There is no evidence of arthropathy or other focal bone abnormality. Soft tissues  are unremarkable.   IMPRESSION: Negative.    PATIENT SURVEYS:  Quick Dash 27.27   POSTURE: Forward head and rounded shoulders.  UPPER EXTREMITY ROM:   Active left shoulder flexion to 140 degrees and ER to 70 degrees (left is 90 degrees).  Bilateral active cervical rotation is 55 degrees.  UPPER EXTREMITY MMT:  Left shoulder ER/IR is a solid 4 to 4+/5.    SHOULDER SPECIAL TESTS: No pain with left shoulder impingement testing   PALPATION:  Tender to palpation over left UT and middle deltoid.  DTR's  Normal UE DTR's.                                                                                                                             TREATMENT DATE:   03/09/24:  Pre-mod e'stim to patient's right Achilles tendon with vasopnematic on low x 20 minutes f/b STW/M x 10 minutes.  Normal modality response following removal of modality.  02/26/24:  Combo e'stim/US  at 1.50 W/CM2 x 12 minutes to patient's left UT f/b STW/M x 12 minutes f/b IFC at 80-150 Hz on 40% scan x 20 minutes to patient's left UT and middle deltoid region.  Normal modality response following removal of modality.   02/20/24:  Combo e'stim/US  at 1.50 W/CM2 x 12 minutes to bilateral UT's (6 minutes each side) f/b STW/M  to patient's bilateral UT's x 12 minutes f/b HMP and IFC at 80-150 Hz on 40% scan x 20 minutes to patient's left UT and middle deltoid region.  Normal modality response following removal of modality.    PATIENT EDUCATION: Education details: See below. Person educated: Patient Education method: Medical illustrator, handout Education comprehension: verbalized understanding and returned demonstration  HOME EXERCISE PROGRAM:  HOME EXERCISE PROGRAM [ESNSCR2]  PECTORALIS CORNER STRETCH -  Repeat 4 Repetitions, Hold  30 Seconds, Complete 1 Set, Perform 4 Times a Day  90 degree Pec stretch -  Repeat 4 Repetitions, Hold 30 Seconds, Complete 2 Sets, Perform 4 Times a  Day  ASSESSMENT:  CLINICAL IMPRESSION: Patient reports a flare-up of her right Achilles pain to an 8/10 after carrying watering pots for flower.  Her active right ankle dorsiflexion with knee in full extension is 5 degrees and with knee flexed is 10 degrees.  Her right ankle strength is 5/5.  She is palpably tender at her right distal calf/Achilles tendon junction.   OBJECTIVE IMPAIRMENTS: decreased activity tolerance, decreased ROM, decreased strength, increased muscle spasms, and pain.   ACTIVITY LIMITATIONS: carrying, lifting, and sleeping  PARTICIPATION LIMITATIONS: meal prep, cleaning, and laundry  PERSONAL FACTORS: Time since onset of injury/illness/exacerbation are also affecting patient's functional outcome.   REHAB POTENTIAL: Good  CLINICAL DECISION MAKING: Evolving/moderate complexity  EVALUATION COMPLEXITY: Moderate   GOALS:  SHORT TERM GOALS: Target date: 02/18/24.  Ind with an initial HEP. Goal status: INITIAL   LONG TERM GOALS: Target date: 03/17/24.  Ind with an advanced HEP.  Goal status: INITIAL  2.  Improve active left shoulder ER to 80 degrees+  Goal status: INITIAL  3.  Improve bilateral active cervical rotation to 65 degrees so she can more easily turn her head while driving.  Goal status: INITIAL  4.  Patient able to sleep on her side.   Baseline:  Goal status: INITIAL  5.  Perform ADL's with pain not > 3/10.  Goal status: INITIAL  6.  Patient walk a community distance with right Achilles pain not exceeding 2/10.    PLAN:  PT FREQUENCY: 2x/week  PT DURATION: 6 weeks  PLANNED INTERVENTIONS: 97110-Therapeutic exercises, 97530- Therapeutic activity, [id number]- Neuromuscular re-education, 97535- Self Care, [id number]- Manual therapy, [id number]- Electrical stimulation (unattended), 97016- Vasopneumatic device, 97035- Ultrasound, Patient/Family education, Dry Needling, Cryotherapy, and Moist heat  PLAN FOR NEXT SESSION: Calf stretches, continue treatment to  neck.  Modalities and STW/M to right Achilles.     Dereck Agerton, [place], PT 03/09/2024, 12:08 PM

## 2024-03-18 ENCOUNTER — Ambulatory Visit: Attending: Sports Medicine | Admitting: Physical Therapy

## 2024-03-18 DIAGNOSIS — G8929 Other chronic pain: Secondary | ICD-10-CM | POA: Insufficient documentation

## 2024-03-18 DIAGNOSIS — M25512 Pain in left shoulder: Secondary | ICD-10-CM | POA: Diagnosis not present

## 2024-03-18 DIAGNOSIS — M62838 Other muscle spasm: Secondary | ICD-10-CM | POA: Insufficient documentation

## 2024-03-18 DIAGNOSIS — M25571 Pain in right ankle and joints of right foot: Secondary | ICD-10-CM | POA: Diagnosis not present

## 2024-03-18 DIAGNOSIS — M542 Cervicalgia: Secondary | ICD-10-CM | POA: Insufficient documentation

## 2024-03-18 DIAGNOSIS — M25612 Stiffness of left shoulder, not elsewhere classified: Secondary | ICD-10-CM | POA: Diagnosis not present

## 2024-03-18 NOTE — Therapy (Signed)
 OUTPATIENT PHYSICAL THERAPY SHOULDER TREATMENT   Patient Name: Tammie [name] MRN: [id number] DOB:05-18-1935, 89 y.o., female Today's Date: 03/18/2024  END OF SESSION:  PT End of Session - 03/18/24 1128     Visit Number 6    Number of Visits 12    Date for PT Re-Evaluation 03/17/24    PT Start Time 1102    PT Stop Time 1142    PT Time Calculation (min) 40 min    Activity Tolerance Patient tolerated treatment well    Behavior During Therapy WFL for tasks assessed/performed             Past Medical History:  Diagnosis Date   Abnormal uterine bleeding    Allergic rhinitis    Allergy    seasonal   Anxiety    panic attacks   Anxiety disorder    Asthma    Cataract    left eye surgery to removed   Cataract    right eye   Colorectal cancer (HCC) 1997   T3, N0   Depression    pt states she isn't depressed anymore, continues paxil  daily   Endometriosis    Fibroid    GERD (gastroesophageal reflux disease)    Hemorrhoids    Hyperlipidemia    diet controlled, no meds   Hypertension    PONV (postoperative nausea and vomiting)    Prediabetes    Seasonal allergies    SVD (spontaneous vaginal delivery)    x 3   Thyroid  disease    Urinary incontinence    Past Surgical History:  Procedure Laterality Date   cataract surgery Left    COLECTOMY  1997   SIGMOID COLECTOMY 1997   COLON SURGERY  1997   COLONOSCOPY     DILATATION & CURETTAGE/HYSTEROSCOPY WITH MYOSURE N/A 11/21/2015   Procedure: DILATATION & CURETTAGE/HYSTEROSCOPY WITH MYOSURE;  Surgeon: Kate Hargis Nearing, MD;  Location: [facility];  Service: Gynecology;  Laterality: N/A;   DILATION AND CURETTAGE OF UTERUS     D & C   POLYPECTOMY     TONSILLECTOMY     Patient Active Problem List   Diagnosis Date Noted   Tick bite of thigh, left, initial encounter 02/07/2023   Venous insufficiency 11/15/2021   Hyperlipidemia 01/15/2018   Diabetes mellitus without complication (HCC) 07/15/2017   Vertigo 07/15/2017   Left  leg pain 12/20/2016   Hemorrhoids 12/20/2016   Dermatitis 12/20/2016   Osteopenia 10/02/2015   Systolic murmur 06/21/2015   Hypertension    Panic attacks    History of colorectal cancer    Allergic rhinitis    Asthma    GERD 06/22/2009    REFERRING PROVIDER: Ozell Jewell DO.  REFERRING DIAG: Pain in left shoulder.  Cervicalgia.  THERAPY DIAG:  Pain in right ankle and joints of right foot  Chronic left shoulder pain  Cervicalgia  Other muscle spasm  Stiffness of left shoulder joint  Rationale for Evaluation and Treatment: Rehabilitation  ONSET DATE: ~About 6 months.  SUBJECTIVE:  SUBJECTIVE STATEMENT: Achilles pain at a 2 and neck feel stiff.  PERTINENT HISTORY: See above.  PAIN:  Are you having pain? Yes: NPRS scale: See above/10. Pain location: Right ankle. Pain description: Ache. Aggravating factors: Lying on side. Relieving factors: As above.  PRECAUTIONS: None  RED FLAGS: None   WEIGHT BEARING RESTRICTIONS: No  FALLS:  Has patient fallen in last 6 months? No  LIVING ENVIRONMENT: Lives in: House/apartment  Has following equipment at home: None   PLOF: Independent  PATIENT GOALS:Reduce pain and be able to lie on side  NEXT MD VISIT:   OBJECTIVE:  Note: Objective measures were completed at Evaluation unless otherwise noted.  DIAGNOSTIC FINDINGS:  No fractures. There is mild narrowing of the C4-C5, C5-C6, C6-C7 disc spaces minimal anterior osteophytic formation C5-C6 and C6-C7 mild posterior bony spondylosis with mild C5-6 foraminal narrowing, left more than right foraminal narrowing. C1-C2 articulation and predental space within normal limits   IMPRESSION: Mild degenerative changes of the cervical spine as described.  There is no evidence of fracture or  dislocation. There is no evidence of arthropathy or other focal bone abnormality. Soft tissues are unremarkable.   IMPRESSION: Negative.    PATIENT SURVEYS:  Quick Dash 27.27   POSTURE: Forward head and rounded shoulders.  UPPER EXTREMITY ROM:   Active left shoulder flexion to 140 degrees and ER to 70 degrees (left is 90 degrees).  Bilateral active cervical rotation is 55 degrees.  UPPER EXTREMITY MMT:  Left shoulder ER/IR is a solid 4 to 4+/5.    SHOULDER SPECIAL TESTS: No pain with left shoulder impingement testing   PALPATION:  Tender to palpation over left UT and middle deltoid.  DTR's  Normal UE DTR's.                                                                                                                             TREATMENT DATE:   03/18/24:  STW/M x 8 minutes to patient's right calf and Achille's tendon f/b Pre-mod e'stim to patient's right Achilles on constant x 20 minutes with vasopneumatic on low x 20 minutes whiel receiving pre-mod e'stim at 80-150 Hz (5 sec on and 5 sec off) to left cervical and middle deltoid region.    03/09/24:  Pre-mod e'stim to patient's right Achilles tendon with vasopnematic on low x 20 minutes f/b STW/M x 10 minutes.  Normal modality response following removal of modality.  02/26/24:  Combo e'stim/US  at 1.50 W/CM2 x 12 minutes to patient's left UT f/b STW/M x 12 minutes f/b IFC at 80-150 Hz on 40% scan x 20 minutes to patient's left UT and middle deltoid region.  Normal modality response following removal of modality.   02/20/24:  Combo e'stim/US  at 1.50 W/CM2 x 12 minutes to bilateral UT's (6 minutes each side) f/b STW/M  to patient's bilateral UT's x 12 minutes f/b HMP and IFC at 80-150 Hz on 40% scan x 20 minutes to patient's left  UT and middle deltoid region.  Normal modality response following removal of modality.    PATIENT EDUCATION: Education details: See below. Person educated: Patient Education method: Software engineer, handout Education comprehension: verbalized understanding and returned demonstration  HOME EXERCISE PROGRAM:  HOME EXERCISE PROGRAM [ESNSCR2]  PECTORALIS CORNER STRETCH -  Repeat 4 Repetitions, Hold 30 Seconds, Complete 1 Set, Perform 4 Times a Day  90 degree Pec stretch -  Repeat 4 Repetitions, Hold 30 Seconds, Complete 2 Sets, Perform 4 Times a Day  ASSESSMENT:  CLINICAL IMPRESSION: Patient's right Achille's pain is decreased significantly.  She is pleased with her progress thus far.    OBJECTIVE IMPAIRMENTS: decreased activity tolerance, decreased ROM, decreased strength, increased muscle spasms, and pain.   ACTIVITY LIMITATIONS: carrying, lifting, and sleeping  PARTICIPATION LIMITATIONS: meal prep, cleaning, and laundry  PERSONAL FACTORS: Time since onset of injury/illness/exacerbation are also affecting patient's functional outcome.   REHAB POTENTIAL: Good  CLINICAL DECISION MAKING: Evolving/moderate complexity  EVALUATION COMPLEXITY: Moderate   GOALS:  SHORT TERM GOALS: Target date: 02/18/24.  Ind with an initial HEP. Goal status: INITIAL   LONG TERM GOALS: Target date: 03/17/24.  Ind with an advanced HEP.  Goal status: INITIAL  2.  Improve active left shoulder ER to 80 degrees+  Goal status: INITIAL  3.  Improve bilateral active cervical rotation to 65 degrees so she can more easily turn her head while driving.  Goal status: INITIAL  4.  Patient able to sleep on her side.   Baseline:  Goal status: INITIAL  5.  Perform ADL's with pain not > 3/10.  Goal status: INITIAL  6.  Patient walk a community distance with right Achilles pain not exceeding 2/10.    PLAN:  PT FREQUENCY: 2x/week  PT DURATION: 6 weeks  PLANNED INTERVENTIONS: 97110-Therapeutic exercises, 97530- Therapeutic activity, [id number]- Neuromuscular re-education, 97535- Self Care, 02859- Manual therapy, G0283- Electrical stimulation (unattended), 97016- Vasopneumatic device,  97035- Ultrasound, Patient/Family education, Dry Needling, Cryotherapy, and Moist heat  PLAN FOR NEXT SESSION: Calf stretches, continue treatment to neck.  Modalities and STW/M to right Achilles.     Torris House, [place], PT 03/18/2024, 11:51 AM

## 2024-03-30 ENCOUNTER — Ambulatory Visit

## 2024-03-30 DIAGNOSIS — M25612 Stiffness of left shoulder, not elsewhere classified: Secondary | ICD-10-CM

## 2024-03-30 DIAGNOSIS — M542 Cervicalgia: Secondary | ICD-10-CM

## 2024-03-30 DIAGNOSIS — M25571 Pain in right ankle and joints of right foot: Secondary | ICD-10-CM

## 2024-03-30 DIAGNOSIS — G8929 Other chronic pain: Secondary | ICD-10-CM

## 2024-03-30 DIAGNOSIS — M62838 Other muscle spasm: Secondary | ICD-10-CM

## 2024-03-30 NOTE — Therapy (Signed)
 OUTPATIENT PHYSICAL THERAPY SHOULDER TREATMENT   Patient Name: Tammie [name] MRN: [id number] DOB:1935/02/10, 89 y.o., female Today's Date: 03/30/2024  END OF SESSION:  PT End of Session - 03/30/24 1146     Visit Number 7    Number of Visits 12    Date for PT Re-Evaluation 03/17/24    PT Start Time 1054    Activity Tolerance Patient tolerated treatment well    Behavior During Therapy [facility] for tasks assessed/performed             Past Medical History:  Diagnosis Date   Abnormal uterine bleeding    Allergic rhinitis    Allergy    seasonal   Anxiety    panic attacks   Anxiety disorder    Asthma    Cataract    left eye surgery to removed   Cataract    right eye   Colorectal cancer (HCC) 1997   T3, N0   Depression    pt states she isn't depressed anymore, continues paxil  daily   Endometriosis    Fibroid    GERD (gastroesophageal reflux disease)    Hemorrhoids    Hyperlipidemia    diet controlled, no meds   Hypertension    PONV (postoperative nausea and vomiting)    Prediabetes    Seasonal allergies    SVD (spontaneous vaginal delivery)    x 3   Thyroid  disease    Urinary incontinence    Past Surgical History:  Procedure Laterality Date   cataract surgery Left    COLECTOMY  1997   SIGMOID COLECTOMY 1997   COLON SURGERY  1997   COLONOSCOPY     DILATATION & CURETTAGE/HYSTEROSCOPY WITH MYOSURE N/A 11/21/2015   Procedure: DILATATION & CURETTAGE/HYSTEROSCOPY WITH MYOSURE;  Surgeon: Kate Hargis Nearing, MD;  Location: [facility];  Service: Gynecology;  Laterality: N/A;   DILATION AND CURETTAGE OF UTERUS     D & C   POLYPECTOMY     TONSILLECTOMY     Patient Active Problem List   Diagnosis Date Noted   Tick bite of thigh, left, initial encounter 02/07/2023   Venous insufficiency 11/15/2021   Hyperlipidemia 01/15/2018   Diabetes mellitus without complication (HCC) 07/15/2017   Vertigo 07/15/2017   Left leg pain 12/20/2016   Hemorrhoids 12/20/2016    Dermatitis 12/20/2016   Osteopenia 10/02/2015   Systolic murmur 06/21/2015   Hypertension    Panic attacks    History of colorectal cancer    Allergic rhinitis    Asthma    GERD 06/22/2009    REFERRING PROVIDER: Ozell Jewell DO.  REFERRING DIAG: Pain in left shoulder.  Cervicalgia.  THERAPY DIAG:  Pain in right ankle and joints of right foot  Chronic left shoulder pain  Cervicalgia  Other muscle spasm  Stiffness of left shoulder joint  Rationale for Evaluation and Treatment: Rehabilitation  ONSET DATE: ~About 6 months.  SUBJECTIVE:  SUBJECTIVE STATEMENT: Achilles pain at a 2 and neck feel stiff.  PERTINENT HISTORY: See above.  PAIN:  Are you having pain? Yes: NPRS scale: See above/10. Pain location: Right ankle. Pain description: Ache. Aggravating factors: Lying on side. Relieving factors: As above.  PRECAUTIONS: None  RED FLAGS: None   WEIGHT BEARING RESTRICTIONS: No  FALLS:  Has patient fallen in last 6 months? No  LIVING ENVIRONMENT: Lives in: House/apartment  Has following equipment at home: None   PLOF: Independent  PATIENT GOALS:Reduce pain and be able to lie on side  NEXT MD VISIT:   OBJECTIVE:  Note: Objective measures were completed at Evaluation unless otherwise noted.  DIAGNOSTIC FINDINGS:  No fractures. There is mild narrowing of the C4-C5, C5-C6, C6-C7 disc spaces minimal anterior osteophytic formation C5-C6 and C6-C7 mild posterior bony spondylosis with mild C5-6 foraminal narrowing, left more than right foraminal narrowing. C1-C2 articulation and predental space within normal limits   IMPRESSION: Mild degenerative changes of the cervical spine as described.  There is no evidence of fracture or dislocation. There is no evidence of arthropathy  or other focal bone abnormality. Soft tissues are unremarkable.   IMPRESSION: Negative.    PATIENT SURVEYS:  Quick Dash 27.27   POSTURE: Forward head and rounded shoulders.  UPPER EXTREMITY ROM:   Active left shoulder flexion to 140 degrees and ER to 70 degrees (left is 90 degrees).  Bilateral active cervical rotation is 55 degrees.  UPPER EXTREMITY MMT:  Left shoulder ER/IR is a solid 4 to 4+/5.    SHOULDER SPECIAL TESTS: No pain with left shoulder impingement testing   PALPATION:  Tender to palpation over left UT and middle deltoid.  DTR's  Normal UE DTR's.                                                                                                                             TREATMENT DATE:   03/30/24:  Manual Therapy Soft Tissue Mobilization: left upper trap and right gastroc/achilles, STW/M performed to left upper trap and right gastroc/achilles region to decrease pain and tone.    Modalities  Date:  Unattended Estim: Cervical and Ankle, Pre-Mod 80-150 Hz, 15 mins, Pain and Tone Combo: Cervical, 1.5 w/cms; 100%, 12 mins, Pain Vaso: Ankle, 34 degrees; low pressure, 15 mins, Pain   03/18/24:  STW/M x 8 minutes to patient's right calf and Achille's tendon f/b Pre-mod e'stim to patient's right Achilles on constant x 20 minutes with vasopneumatic on low x 20 minutes whiel receiving pre-mod e'stim at 80-150 Hz (5 sec on and 5 sec off) to left cervical and middle deltoid region.    03/09/24:  Pre-mod e'stim to patient's right Achilles tendon with vasopnematic on low x 20 minutes f/b STW/M x 10 minutes.  Normal modality response following removal of modality.  02/26/24:  Combo e'stim/US  at 1.50 W/CM2 x 12 minutes to patient's left UT f/b STW/M x 12 minutes f/b IFC at 80-150 Hz  on 40% scan x 20 minutes to patient's left UT and middle deltoid region.  Normal modality response following removal of modality.   PATIENT EDUCATION: Education details: See below. Person  educated: Patient Education method: Medical illustrator, handout Education comprehension: verbalized understanding and returned demonstration  HOME EXERCISE PROGRAM:  HOME EXERCISE PROGRAM [ESNSCR2]  PECTORALIS CORNER STRETCH -  Repeat 4 Repetitions, Hold 30 Seconds, Complete 1 Set, Perform 4 Times a Day  90 degree Pec stretch -  Repeat 4 Repetitions, Hold 30 Seconds, Complete 2 Sets, Perform 4 Times a Day  ASSESSMENT:  CLINICAL IMPRESSION: Pt arrives for today's treatment session reporting 5/10 left neck and shoulder pain.  Pt reports very minimal right achilles pain.  States her foot did well while at the beach last week.  Normal responses to all modalities performed today.  STW/M performed to left upper trap and right calf and achilles region to decrease pain and tone.  Pt reported 3/10 left neck and shoulder pain at completion of today's treatment session.  OBJECTIVE IMPAIRMENTS: decreased activity tolerance, decreased ROM, decreased strength, increased muscle spasms, and pain.   ACTIVITY LIMITATIONS: carrying, lifting, and sleeping  PARTICIPATION LIMITATIONS: meal prep, cleaning, and laundry  PERSONAL FACTORS: Time since onset of injury/illness/exacerbation are also affecting patient's functional outcome.   REHAB POTENTIAL: Good  CLINICAL DECISION MAKING: Evolving/moderate complexity  EVALUATION COMPLEXITY: Moderate   GOALS:  SHORT TERM GOALS: Target date: 02/18/24.  Ind with an initial HEP. Goal status: MET   LONG TERM GOALS: Target date: 03/17/24.  Ind with an advanced HEP.  Goal status: INITIAL  2.  Improve active left shoulder ER to 80 degrees+  Goal status: INITIAL  3.  Improve bilateral active cervical rotation to 65 degrees so she can more easily turn her head while driving.  Goal status: INITIAL  4.  Patient able to sleep on her side.   Baseline:  Goal status: INITIAL  5.  Perform ADL's with pain not > 3/10.  Goal status: INITIAL  6.   Patient walk a community distance with right Achilles pain not exceeding 2/10.    PLAN:  PT FREQUENCY: 2x/week  PT DURATION: 6 weeks  PLANNED INTERVENTIONS: 97110-Therapeutic exercises, 97530- Therapeutic activity, [id number]- Neuromuscular re-education, 97535- Self Care, 02859- Manual therapy, [id number]- Electrical stimulation (unattended), 97016- Vasopneumatic device, 97035- Ultrasound, Patient/Family education, Dry Needling, Cryotherapy, and Moist heat  PLAN FOR NEXT SESSION: Calf stretches, continue treatment to neck.  Modalities and STW/M to right Achilles.     Delon DELENA Gosling, PTA 03/30/2024, 12:06 PM

## 2024-04-06 ENCOUNTER — Ambulatory Visit: Admitting: Physical Therapy

## 2024-04-06 DIAGNOSIS — M25571 Pain in right ankle and joints of right foot: Secondary | ICD-10-CM | POA: Diagnosis not present

## 2024-04-06 DIAGNOSIS — G8929 Other chronic pain: Secondary | ICD-10-CM

## 2024-04-06 DIAGNOSIS — M542 Cervicalgia: Secondary | ICD-10-CM

## 2024-04-06 NOTE — Therapy (Signed)
 OUTPATIENT PHYSICAL THERAPY SHOULDER TREATMENT   Patient Name: Tammie [name] MRN: [id number] DOB:1934/11/11, 89 y.o., female Today's Date: 04/06/2024  END OF SESSION:  PT End of Session - 04/06/24 1225     Visit Number 8    Number of Visits 12    Date for PT Re-Evaluation 03/17/24    PT Start Time 1100    PT Stop Time 1158    PT Time Calculation (min) 58 min    Activity Tolerance Patient tolerated treatment well    Behavior During Therapy WFL for tasks assessed/performed             Past Medical History:  Diagnosis Date   Abnormal uterine bleeding    Allergic rhinitis    Allergy    seasonal   Anxiety    panic attacks   Anxiety disorder    Asthma    Cataract    left eye surgery to removed   Cataract    right eye   Colorectal cancer (HCC) 1997   T3, N0   Depression    pt states she isn't depressed anymore, continues paxil  daily   Endometriosis    Fibroid    GERD (gastroesophageal reflux disease)    Hemorrhoids    Hyperlipidemia    diet controlled, no meds   Hypertension    PONV (postoperative nausea and vomiting)    Prediabetes    Seasonal allergies    SVD (spontaneous vaginal delivery)    x 3   Thyroid  disease    Urinary incontinence    Past Surgical History:  Procedure Laterality Date   cataract surgery Left    COLECTOMY  1997   SIGMOID COLECTOMY 1997   COLON SURGERY  1997   COLONOSCOPY     DILATATION & CURETTAGE/HYSTEROSCOPY WITH MYOSURE N/A 11/21/2015   Procedure: DILATATION & CURETTAGE/HYSTEROSCOPY WITH MYOSURE;  Surgeon: Kate Hargis Nearing, MD;  Location: [facility];  Service: Gynecology;  Laterality: N/A;   DILATION AND CURETTAGE OF UTERUS     D & C   POLYPECTOMY     TONSILLECTOMY     Patient Active Problem List   Diagnosis Date Noted   Tick bite of thigh, left, initial encounter 02/07/2023   Venous insufficiency 11/15/2021   Hyperlipidemia 01/15/2018   Diabetes mellitus without complication (HCC) 07/15/2017   Vertigo 07/15/2017    Left leg pain 12/20/2016   Hemorrhoids 12/20/2016   Dermatitis 12/20/2016   Osteopenia 10/02/2015   Systolic murmur 06/21/2015   Hypertension    Panic attacks    History of colorectal cancer    Allergic rhinitis    Asthma    GERD 06/22/2009    REFERRING PROVIDER: Ozell Jewell DO.  REFERRING DIAG: Pain in left shoulder.  Cervicalgia.  THERAPY DIAG:  Pain in right ankle and joints of right foot  Chronic left shoulder pain  Cervicalgia  Rationale for Evaluation and Treatment: Rehabilitation  ONSET DATE: ~About 6 months.  SUBJECTIVE:  SUBJECTIVE STATEMENT: Neck hurt a lot last night and today it is about a 6.  Right ankle at a 5/10 due to a lot of walking recently.  Patient has not been doing neck exercise.  Patient had small/mild scratch in left UT region.    PERTINENT HISTORY: See above.  PAIN:  Are you having pain? Yes: NPRS scale: See above/10. Pain location: Right ankle. Pain description: Ache. Aggravating factors: Lying on side. Relieving factors: As above.  PRECAUTIONS: None  RED FLAGS: None   WEIGHT BEARING RESTRICTIONS: No  FALLS:  Has patient fallen in last 6 months? No  LIVING ENVIRONMENT: Lives in: House/apartment  Has following equipment at home: None   PLOF: Independent  PATIENT GOALS:Reduce pain and be able to lie on side  NEXT MD VISIT:   OBJECTIVE:  Note: Objective measures were completed at Evaluation unless otherwise noted.  DIAGNOSTIC FINDINGS:  No fractures. There is mild narrowing of the C4-C5, C5-C6, C6-C7 disc spaces minimal anterior osteophytic formation C5-C6 and C6-C7 mild posterior bony spondylosis with mild C5-6 foraminal narrowing, left more than right foraminal narrowing. C1-C2 articulation and predental space within normal limits    IMPRESSION: Mild degenerative changes of the cervical spine as described.  There is no evidence of fracture or dislocation. There is no evidence of arthropathy or other focal bone abnormality. Soft tissues are unremarkable.   IMPRESSION: Negative.    PATIENT SURVEYS:  Quick Dash 27.27   POSTURE: Forward head and rounded shoulders.  UPPER EXTREMITY ROM:   Active left shoulder flexion to 140 degrees and ER to 70 degrees (left is 90 degrees).  Bilateral active cervical rotation is 55 degrees.  UPPER EXTREMITY MMT:  Left shoulder ER/IR is a solid 4 to 4+/5.    SHOULDER SPECIAL TESTS: No pain with left shoulder impingement testing   PALPATION:  Tender to palpation over left UT and middle deltoid.  DTR's  Normal UE DTR's.                                                                                                                             TREATMENT DATE:   04/06/24:  Lesleigh tucks and cervical extension facilitated with a towel f/b combo e'stim/US  at 1.50 W/CM2 x 9 minutes to patient's left cervical region f/b STW/M x 12 minutes f/n pre-mod e'stim at 80-150 Hz (5 sec on and 5 sec off) to left cervical region and constant pre-mod e'stim at 80-150 Hz x 20 minutes to patient's right affected Achilles.   Normal modality response following removal of modality    03/30/24:  Manual Therapy Soft Tissue Mobilization: left upper trap and right gastroc/achilles, STW/M performed to left upper trap and right gastroc/achilles region to decrease pain and tone.    Modalities  Date:  Unattended Estim: Cervical and Ankle, Pre-Mod 80-150 Hz, 15 mins, Pain and Tone Combo: Cervical, 1.5 w/cms; 100%, 12 mins, Pain Vaso: Ankle, 34 degrees; low pressure, 15 mins, Pain  PATIENT EDUCATION: Education details: See below. Person educated: Patient Education method: Medical illustrator, handout Education comprehension: verbalized understanding and returned  demonstration  HOME EXERCISE PROGRAM:  HOME EXERCISE PROGRAM [ESNSCR2]  PECTORALIS CORNER STRETCH -  Repeat 4 Repetitions, Hold 30 Seconds, Complete 1 Set, Perform 4 Times a Day  90 degree Pec stretch -  Repeat 4 Repetitions, Hold 30 Seconds, Complete 2 Sets, Perform 4 Times a Day  ASSESSMENT:  CLINICAL IMPRESSION: Reviewed and performed neck exercise using towel as patient states she has not been doing it.  She performed the exercise very well.  She states her neck and right Achilles felt much better after treatment.    ACTIVITY LIMITATIONS: carrying, lifting, and sleeping  PARTICIPATION LIMITATIONS: meal prep, cleaning, and laundry  PERSONAL FACTORS: Time since onset of injury/illness/exacerbation are also affecting patient's functional outcome.   REHAB POTENTIAL: Good  CLINICAL DECISION MAKING: Evolving/moderate complexity  EVALUATION COMPLEXITY: Moderate   GOALS:  SHORT TERM GOALS: Target date: 02/18/24.  Ind with an initial HEP. Goal status: MET   LONG TERM GOALS: Target date: 03/17/24.  Ind with an advanced HEP.  Goal status: INITIAL  2.  Improve active left shoulder ER to 80 degrees+  Goal status: INITIAL  3.  Improve bilateral active cervical rotation to 65 degrees so she can more easily turn her head while driving.  Goal status: INITIAL  4.  Patient able to sleep on her side.   Baseline:  Goal status: INITIAL  5.  Perform ADL's with pain not > 3/10.  Goal status: INITIAL  6.  Patient walk a community distance with right Achilles pain not exceeding 2/10.    PLAN:  PT FREQUENCY: 2x/week  PT DURATION: 6 weeks  PLANNED INTERVENTIONS: 97110-Therapeutic exercises, 97530- Therapeutic activity, [id number]- Neuromuscular re-education, 97535- Self Care, 02859- Manual therapy, G0283- Electrical stimulation (unattended), 97016- Vasopneumatic device, 97035- Ultrasound, Patient/Family education, Dry Needling, Cryotherapy, and Moist heat  PLAN FOR NEXT SESSION: Calf  stretches, continue treatment to neck.  Modalities and STW/M to right Achilles.     Wilburta Milbourn, [place], PT 04/06/2024, 12:42 PM

## 2024-04-12 DIAGNOSIS — M9902 Segmental and somatic dysfunction of thoracic region: Secondary | ICD-10-CM | POA: Diagnosis not present

## 2024-04-12 DIAGNOSIS — M542 Cervicalgia: Secondary | ICD-10-CM | POA: Diagnosis not present

## 2024-04-12 DIAGNOSIS — M546 Pain in thoracic spine: Secondary | ICD-10-CM | POA: Diagnosis not present

## 2024-04-12 DIAGNOSIS — M25511 Pain in right shoulder: Secondary | ICD-10-CM | POA: Diagnosis not present

## 2024-04-12 DIAGNOSIS — M9907 Segmental and somatic dysfunction of upper extremity: Secondary | ICD-10-CM | POA: Diagnosis not present

## 2024-04-12 DIAGNOSIS — M9901 Segmental and somatic dysfunction of cervical region: Secondary | ICD-10-CM | POA: Diagnosis not present

## 2024-04-12 DIAGNOSIS — M79671 Pain in right foot: Secondary | ICD-10-CM | POA: Diagnosis not present

## 2024-04-13 ENCOUNTER — Encounter: Payer: Self-pay | Admitting: Physical Therapy

## 2024-04-13 ENCOUNTER — Ambulatory Visit: Payer: Self-pay | Admitting: Physical Therapy

## 2024-04-13 DIAGNOSIS — M25571 Pain in right ankle and joints of right foot: Secondary | ICD-10-CM

## 2024-04-13 DIAGNOSIS — M25612 Stiffness of left shoulder, not elsewhere classified: Secondary | ICD-10-CM

## 2024-04-13 DIAGNOSIS — M62838 Other muscle spasm: Secondary | ICD-10-CM

## 2024-04-13 DIAGNOSIS — G8929 Other chronic pain: Secondary | ICD-10-CM

## 2024-04-13 DIAGNOSIS — M542 Cervicalgia: Secondary | ICD-10-CM

## 2024-04-13 NOTE — Therapy (Signed)
 OUTPATIENT PHYSICAL THERAPY SHOULDER TREATMENT   Patient Name: Tammie [name] MRN: [id number] DOB:May 02, 1935, 89 y.o., female Today's Date: 04/13/2024  END OF SESSION:  PT End of Session - 04/13/24 1141     Visit Number 9    Number of Visits 12    Date for PT Re-Evaluation 03/17/24    PT Start Time 1100    PT Stop Time 1157    PT Time Calculation (min) 57 min    Activity Tolerance Patient tolerated treatment well    Behavior During Therapy WFL for tasks assessed/performed             Past Medical History:  Diagnosis Date   Abnormal uterine bleeding    Allergic rhinitis    Allergy    seasonal   Anxiety    panic attacks   Anxiety disorder    Asthma    Cataract    left eye surgery to removed   Cataract    right eye   Colorectal cancer (HCC) 1997   T3, N0   Depression    pt states she isn't depressed anymore, continues paxil  daily   Endometriosis    Fibroid    GERD (gastroesophageal reflux disease)    Hemorrhoids    Hyperlipidemia    diet controlled, no meds   Hypertension    PONV (postoperative nausea and vomiting)    Prediabetes    Seasonal allergies    SVD (spontaneous vaginal delivery)    x 3   Thyroid  disease    Urinary incontinence    Past Surgical History:  Procedure Laterality Date   cataract surgery Left    COLECTOMY  1997   SIGMOID COLECTOMY 1997   COLON SURGERY  1997   COLONOSCOPY     DILATATION & CURETTAGE/HYSTEROSCOPY WITH MYOSURE N/A 11/21/2015   Procedure: DILATATION & CURETTAGE/HYSTEROSCOPY WITH MYOSURE;  Surgeon: Kate Hargis Nearing, MD;  Location: [facility];  Service: Gynecology;  Laterality: N/A;   DILATION AND CURETTAGE OF UTERUS     D & C   POLYPECTOMY     TONSILLECTOMY     Patient Active Problem List   Diagnosis Date Noted   Tick bite of thigh, left, initial encounter 02/07/2023   Venous insufficiency 11/15/2021   Hyperlipidemia 01/15/2018   Diabetes mellitus without complication (HCC) 07/15/2017   Vertigo 07/15/2017    Left leg pain 12/20/2016   Hemorrhoids 12/20/2016   Dermatitis 12/20/2016   Osteopenia 10/02/2015   Systolic murmur 06/21/2015   Hypertension    Panic attacks    History of colorectal cancer    Allergic rhinitis    Asthma    GERD 06/22/2009    REFERRING PROVIDER: Ozell Jewell DO.  REFERRING DIAG: Pain in left shoulder.  Cervicalgia.  THERAPY DIAG:  Pain in right ankle and joints of right foot  Chronic left shoulder pain  Cervicalgia  Other muscle spasm  Stiffness of left shoulder joint  Rationale for Evaluation and Treatment: Rehabilitation  ONSET DATE: ~About 6 months.  SUBJECTIVE:  SUBJECTIVE STATEMENT: Pain not too bad.  Saw Dr. Marquette yesterday and her moved my neck around.   PERTINENT HISTORY: See above.  PAIN:  Are you having pain? Yes: NPRS scale: See above/10. Pain location: Right ankle. Pain description: Ache. Aggravating factors: Lying on side. Relieving factors: As above.  PRECAUTIONS: None  RED FLAGS: None   WEIGHT BEARING RESTRICTIONS: No  FALLS:  Has patient fallen in last 6 months? No  LIVING ENVIRONMENT: Lives in: House/apartment  Has following equipment at home: None   PLOF: Independent  PATIENT GOALS:Reduce pain and be able to lie on side  NEXT MD VISIT:   OBJECTIVE:  Note: Objective measures were completed at Evaluation unless otherwise noted.  DIAGNOSTIC FINDINGS:  No fractures. There is mild narrowing of the C4-C5, C5-C6, C6-C7 disc spaces minimal anterior osteophytic formation C5-C6 and C6-C7 mild posterior bony spondylosis with mild C5-6 foraminal narrowing, left more than right foraminal narrowing. C1-C2 articulation and predental space within normal limits   IMPRESSION: Mild degenerative changes of the cervical spine as  described.  There is no evidence of fracture or dislocation. There is no evidence of arthropathy or other focal bone abnormality. Soft tissues are unremarkable.   IMPRESSION: Negative.    PATIENT SURVEYS:  Quick Dash 27.27   POSTURE: Forward head and rounded shoulders.  UPPER EXTREMITY ROM:   Active left shoulder flexion to 140 degrees and ER to 70 degrees (left is 90 degrees).  Bilateral active cervical rotation is 55 degrees.  UPPER EXTREMITY MMT:  Left shoulder ER/IR is a solid 4 to 4+/5.    SHOULDER SPECIAL TESTS: No pain with left shoulder impingement testing   PALPATION:  Tender to palpation over left UT and middle deltoid.  DTR's  Normal UE DTR's.                                                                                                                             TREATMENT DATE:   04/13/24:  Combo e'stim/US  at 1.50 W/CM2 x 12 minutes to patient's left cervical region f/b STW/M x 11 minutes f/n pre-mod e'stim at 80-150 Hz on constant to left cervical region and constant pre-mod e'stim at 80-150 Hz x 20 minutes to patient's right affected Achilles.   Normal modality response following removal of modality    04/06/24:  Chin tucks and cervical extension facilitated with a towel f/b combo e'stim/US  at 1.50 W/CM2 x 9 minutes to patient's left cervical region f/b STW/M x 12 minutes f/n pre-mod e'stim at 80-150 Hz (5 sec on and 5 sec off) to left cervical region and constant pre-mod e'stim at 80-150 Hz x 20 minutes to patient's right affected Achilles.   Normal modality response following removal of modality    03/30/24:  Manual Therapy Soft Tissue Mobilization: left upper trap and right gastroc/achilles, STW/M performed to left upper trap and right gastroc/achilles region to decrease pain and tone.    Modalities  Date:  Unattended Estim:  Cervical and Ankle, Pre-Mod 80-150 Hz, 15 mins, Pain and Tone Combo: Cervical, 1.5 w/cms; 100%, 12 mins, Pain Vaso:  Ankle, 34 degrees; low pressure, 15 mins, Pain     PATIENT EDUCATION: Education details: See below. Person educated: Patient Education method: Medical illustrator, handout Education comprehension: verbalized understanding and returned demonstration  HOME EXERCISE PROGRAM:  HOME EXERCISE PROGRAM [ESNSCR2]  PECTORALIS CORNER STRETCH -  Repeat 4 Repetitions, Hold 30 Seconds, Complete 1 Set, Perform 4 Times a Day  90 degree Pec stretch -  Repeat 4 Repetitions, Hold 30 Seconds, Complete 2 Sets, Perform 4 Times a Day  ASSESSMENT:  CLINICAL IMPRESSION: Patient reporting her left cervical and right Achilles pain is low today.  She is pleased with her progress thus far.  LTG's #1 and 2 MET. LTG #3 partially met.    ACTIVITY LIMITATIONS: carrying, lifting, and sleeping  PARTICIPATION LIMITATIONS: meal prep, cleaning, and laundry  PERSONAL FACTORS: Time since onset of injury/illness/exacerbation are also affecting patient's functional outcome.   REHAB POTENTIAL: Good  CLINICAL DECISION MAKING: Evolving/moderate complexity  EVALUATION COMPLEXITY: Moderate   GOALS:  SHORT TERM GOALS: Target date: 02/18/24.  Ind with an initial HEP. Goal status: MET   LONG TERM GOALS: Target date: 03/17/24.  Ind with an advanced HEP.  Goal status: MET.    2.  Improve active left shoulder ER to 80 degrees+  Goal status: MET  3.  Improve bilateral active cervical rotation to 65 degrees so she can more easily turn her head while driving.  Goal status:MET to right.  4.  Patient able to sleep on her side.   Baseline:  Goal status: INITIAL  5.  Perform ADL's with pain not > 3/10.  Goal status: INITIAL  6.  Patient walk a community distance with right Achilles pain not exceeding 2/10.    PLAN:  PT FREQUENCY: 2x/week  PT DURATION: 6 weeks  PLANNED INTERVENTIONS: 97110-Therapeutic exercises, 97530- Therapeutic activity, [id number]- Neuromuscular re-education, 97535- Self Care, 02859-  Manual therapy, G0283- Electrical stimulation (unattended), 97016- Vasopneumatic device, 97035- Ultrasound, Patient/Family education, Dry Needling, Cryotherapy, and Moist heat  PLAN FOR NEXT SESSION: Calf stretches, continue treatment to neck.  Modalities and STW/M to right Achilles.     Adrik Khim, [place], PT 04/13/2024, 12:03 PM

## 2024-04-27 ENCOUNTER — Ambulatory Visit: Attending: Sports Medicine | Admitting: Physical Therapy

## 2024-04-27 DIAGNOSIS — G8929 Other chronic pain: Secondary | ICD-10-CM | POA: Insufficient documentation

## 2024-04-27 DIAGNOSIS — M25571 Pain in right ankle and joints of right foot: Secondary | ICD-10-CM | POA: Insufficient documentation

## 2024-04-27 DIAGNOSIS — M25512 Pain in left shoulder: Secondary | ICD-10-CM | POA: Insufficient documentation

## 2024-04-27 DIAGNOSIS — M62838 Other muscle spasm: Secondary | ICD-10-CM | POA: Insufficient documentation

## 2024-04-27 DIAGNOSIS — M25612 Stiffness of left shoulder, not elsewhere classified: Secondary | ICD-10-CM | POA: Insufficient documentation

## 2024-04-27 DIAGNOSIS — M542 Cervicalgia: Secondary | ICD-10-CM | POA: Diagnosis not present

## 2024-04-27 NOTE — Therapy (Signed)
 OUTPATIENT PHYSICAL THERAPY SHOULDER TREATMENT   Patient Name: Tammie [name] MRN: [id number] DOB:[birth year], 89 y.o., female Today's Date: 04/27/2024  END OF SESSION:  PT End of Session - 04/27/24 1218     Visit Number 10    Number of Visits 12    Date for PT Re-Evaluation 03/17/24    PT Start Time 1100    PT Stop Time 1158    PT Time Calculation (min) 58 min    Activity Tolerance Patient tolerated treatment well    Behavior During Therapy WFL for tasks assessed/performed              Past Medical History:  Diagnosis Date   Abnormal uterine bleeding    Allergic rhinitis    Allergy    seasonal   Anxiety    panic attacks   Anxiety disorder    Asthma    Cataract    left eye surgery to removed   Cataract    right eye   Colorectal cancer (HCC) 1997   T3, N0   Depression    pt states she isn't depressed anymore, continues paxil  daily   Endometriosis    Fibroid    GERD (gastroesophageal reflux disease)    Hemorrhoids    Hyperlipidemia    diet controlled, no meds   Hypertension    PONV (postoperative nausea and vomiting)    Prediabetes    Seasonal allergies    SVD (spontaneous vaginal delivery)    x 3   Thyroid  disease    Urinary incontinence    Past Surgical History:  Procedure Laterality Date   cataract surgery Left    COLECTOMY  1997   SIGMOID COLECTOMY 1997   COLON SURGERY  1997   COLONOSCOPY     DILATATION & CURETTAGE/HYSTEROSCOPY WITH MYOSURE N/A 11/21/2015   Procedure: DILATATION & CURETTAGE/HYSTEROSCOPY WITH MYOSURE;  Surgeon: Kate Hargis Nearing, MD;  Location: [facility];  Service: Gynecology;  Laterality: N/A;   DILATION AND CURETTAGE OF UTERUS     D & C   POLYPECTOMY     TONSILLECTOMY     Patient Active Problem List   Diagnosis Date Noted   Tick bite of thigh, left, initial encounter 02/07/2023   Venous insufficiency 11/15/2021   Hyperlipidemia 01/15/2018   Diabetes mellitus without complication (HCC) 07/15/2017   Vertigo 07/15/2017    Left leg pain 12/20/2016   Hemorrhoids 12/20/2016   Dermatitis 12/20/2016   Osteopenia 10/02/2015   Systolic murmur 06/21/2015   Hypertension    Panic attacks    History of colorectal cancer    Allergic rhinitis    Asthma    GERD 06/22/2009    REFERRING PROVIDER: Ozell Jewell DO.  REFERRING DIAG: Pain in left shoulder.  Cervicalgia.  THERAPY DIAG:  Pain in right ankle and joints of right foot  Chronic left shoulder pain  Cervicalgia  Other muscle spasm  Stiffness of left shoulder joint  Rationale for Evaluation and Treatment: Rehabilitation  ONSET DATE: ~About 6 months.  SUBJECTIVE:  SUBJECTIVE STATEMENT: Doing much better overall. Pain low in right Achilles and neck today.  Learning to be more careful about walking so Achilles pain doesn't increase.   PERTINENT HISTORY: See above.  PAIN:  Are you having pain? Yes: NPRS scale: See above/10. Pain location: Right ankle. Pain description: Ache. Aggravating factors: Lying on side. Relieving factors: As above.  PRECAUTIONS: None  RED FLAGS: None   WEIGHT BEARING RESTRICTIONS: No  FALLS:  Has patient fallen in last 6 months? No  LIVING ENVIRONMENT: Lives in: House/apartment  Has following equipment at home: None   PLOF: Independent  PATIENT GOALS:Reduce pain and be able to lie on side  NEXT MD VISIT:   OBJECTIVE:  Note: Objective measures were completed at Evaluation unless otherwise noted.  DIAGNOSTIC FINDINGS:  No fractures. There is mild narrowing of the C4-C5, C5-C6, C6-C7 disc spaces minimal anterior osteophytic formation C5-C6 and C6-C7 mild posterior bony spondylosis with mild C5-6 foraminal narrowing, left more than right foraminal narrowing. C1-C2 articulation and predental space within normal limits    IMPRESSION: Mild degenerative changes of the cervical spine as described.  There is no evidence of fracture or dislocation. There is no evidence of arthropathy or other focal bone abnormality. Soft tissues are unremarkable.   IMPRESSION: Negative.    PATIENT SURVEYS:  Quick Dash 27.27   POSTURE: Forward head and rounded shoulders.  UPPER EXTREMITY ROM:   Active left shoulder flexion to 140 degrees and ER to 70 degrees (left is 90 degrees).  Bilateral active cervical rotation is 55 degrees.  UPPER EXTREMITY MMT:  Left shoulder ER/IR is a solid 4 to 4+/5.    SHOULDER SPECIAL TESTS: No pain with left shoulder impingement testing   PALPATION:  Tender to palpation over left UT and middle deltoid.  DTR's  Normal UE DTR's.                                                                                                                             TREATMENT DATE:   04/27/24  Combo e'stim/US  at 1.50 W/CM2 x 12 minutes to patient's left cervical region f/b STW/M x 11 minutes f/n pre-mod e'stim at 80-150 Hz on constant to left cervical region and  pre-mod e'stim (5 sec on and 5 sec off) at 80-150 Hz x 20 minutes to patient's right affected Achilles.   Normal modality response following removal of modality   04/13/24:  Combo e'stim/US  at 1.50 W/CM2 x 12 minutes to patient's left cervical region f/b STW/M x 11 minutes f/n pre-mod e'stim at 80-150 Hz on constant to left cervical region and constant pre-mod e'stim at 80-150 Hz x 20 minutes to patient's right affected Achilles.   Normal modality response following removal of modality    04/06/24:  Chin tucks and cervical extension facilitated with a towel f/b combo e'stim/US  at 1.50 W/CM2 x 9 minutes to patient's left cervical region f/b STW/M x 12 minutes f/n pre-mod e'stim at 80-150 Hz (  5 sec on and 5 sec off) to left cervical region and constant pre-mod e'stim at 80-150 Hz x 20 minutes to patient's right affected Achilles.   Normal  modality response following removal of modality    03/30/24:  Manual Therapy Soft Tissue Mobilization: left upper trap and right gastroc/achilles, STW/M performed to left upper trap and right gastroc/achilles region to decrease pain and tone.    Modalities  Date:  Unattended Estim: Cervical and Ankle, Pre-Mod 80-150 Hz, 15 mins, Pain and Tone Combo: Cervical, 1.5 w/cms; 100%, 12 mins, Pain Vaso: Ankle, 34 degrees; low pressure, 15 mins, Pain     PATIENT EDUCATION: Education details: See below. Person educated: Patient Education method: Medical illustrator, handout Education comprehension: verbalized understanding and returned demonstration  HOME EXERCISE PROGRAM:  HOME EXERCISE PROGRAM [ESNSCR2]  PECTORALIS CORNER STRETCH -  Repeat 4 Repetitions, Hold 30 Seconds, Complete 1 Set, Perform 4 Times a Day  90 degree Pec stretch -  Repeat 4 Repetitions, Hold 30 Seconds, Complete 2 Sets, Perform 4 Times a Day  ASSESSMENT:  CLINICAL IMPRESSION: See below.  ACTIVITY LIMITATIONS: carrying, lifting, and sleeping  PARTICIPATION LIMITATIONS: meal prep, cleaning, and laundry  PERSONAL FACTORS: Time since onset of injury/illness/exacerbation are also affecting patient's functional outcome.   REHAB POTENTIAL: Good  CLINICAL DECISION MAKING: Evolving/moderate complexity  EVALUATION COMPLEXITY: Moderate   GOALS:  SHORT TERM GOALS: Target date: 02/18/24.  Ind with an initial HEP. Goal status: MET   LONG TERM GOALS: Target date: 03/17/24.  Ind with an advanced HEP.  Goal status: MET.    2.  Improve active left shoulder ER to 80 degrees+  Goal status: MET  3.  Improve bilateral active cervical rotation to 65 degrees so she can more easily turn her head while driving.  Goal status:MET to right.  4.  Patient able to sleep on her side.   Baseline:  Goal status: INITIAL  5.  Perform ADL's with pain not > 3/10.  Goal status: INITIAL  6.  Patient walk a community  distance with right Achilles pain not exceeding 2/10.  Ongoing.    PLAN:  PT FREQUENCY: 2x/week  PT DURATION: 6 weeks  PLANNED INTERVENTIONS: 97110-Therapeutic exercises, 97530- Therapeutic activity, [id number]- Neuromuscular re-education, 97535- Self Care, 02859- Manual therapy, G0283- Electrical stimulation (unattended), 97016- Vasopneumatic device, 97035- Ultrasound, Patient/Family education, Dry Needling, Cryotherapy, and Moist heat  PLAN FOR NEXT SESSION: Calf stretches, continue treatment to neck.  Modalities and STW/M to right Achilles.    Progress Note Reporting Period 02/04/24 to 04/27/24.  See note below for Objective Data and Assessment of Progress/Goals. Patient progressing well toward goals and is pleased with her progress.  Her pain was low today.     [place] Lorielle Boehning MPT     [place], [place], PT 04/27/2024, 12:22 PM

## 2024-05-18 ENCOUNTER — Ambulatory Visit: Admitting: Physical Therapy

## 2024-05-20 ENCOUNTER — Ambulatory Visit: Attending: Sports Medicine | Admitting: Physical Therapy

## 2024-05-20 DIAGNOSIS — M25512 Pain in left shoulder: Secondary | ICD-10-CM | POA: Insufficient documentation

## 2024-05-20 DIAGNOSIS — G8929 Other chronic pain: Secondary | ICD-10-CM | POA: Diagnosis not present

## 2024-05-20 DIAGNOSIS — M25571 Pain in right ankle and joints of right foot: Secondary | ICD-10-CM | POA: Diagnosis not present

## 2024-05-20 DIAGNOSIS — M542 Cervicalgia: Secondary | ICD-10-CM | POA: Insufficient documentation

## 2024-05-20 NOTE — Therapy (Addendum)
 OUTPATIENT PHYSICAL THERAPY SHOULDER TREATMENT   Patient Name: Tammie [name] MRN: [id number] DOB:14-Dec-1934, 89 y.o., female Today's Date: 05/20/2024  END OF SESSION:  PT End of Session - 05/20/24 1232     Visit Number 11    Number of Visits 12    Date for PT Re-Evaluation 05/25/24    PT Start Time 1100    PT Stop Time 1151    PT Time Calculation (min) 51 min    Activity Tolerance Patient tolerated treatment well    Behavior During Therapy WFL for tasks assessed/performed              Past Medical History:  Diagnosis Date   Abnormal uterine bleeding    Allergic rhinitis    Allergy    seasonal   Anxiety    panic attacks   Anxiety disorder    Asthma    Cataract    left eye surgery to removed   Cataract    right eye   Colorectal cancer (HCC) 1997   T3, N0   Depression    pt states she isn't depressed anymore, continues paxil  daily   Endometriosis    Fibroid    GERD (gastroesophageal reflux disease)    Hemorrhoids    Hyperlipidemia    diet controlled, no meds   Hypertension    PONV (postoperative nausea and vomiting)    Prediabetes    Seasonal allergies    SVD (spontaneous vaginal delivery)    x 3   Thyroid  disease    Urinary incontinence    Past Surgical History:  Procedure Laterality Date   cataract surgery Left    COLECTOMY  1997   SIGMOID COLECTOMY 1997   COLON SURGERY  1997   COLONOSCOPY     DILATATION & CURETTAGE/HYSTEROSCOPY WITH MYOSURE N/A 11/21/2015   Procedure: DILATATION & CURETTAGE/HYSTEROSCOPY WITH MYOSURE;  Surgeon: Kate Hargis Nearing, MD;  Location: [facility];  Service: Gynecology;  Laterality: N/A;   DILATION AND CURETTAGE OF UTERUS     D & C   POLYPECTOMY     TONSILLECTOMY     Patient Active Problem List   Diagnosis Date Noted   Tick bite of thigh, left, initial encounter 02/07/2023   Venous insufficiency 11/15/2021   Hyperlipidemia 01/15/2018   Diabetes mellitus without complication (HCC) 07/15/2017   Vertigo 07/15/2017    Left leg pain 12/20/2016   Hemorrhoids 12/20/2016   Dermatitis 12/20/2016   Osteopenia 10/02/2015   Systolic murmur 06/21/2015   Hypertension    Panic attacks    History of colorectal cancer    Allergic rhinitis    Asthma    GERD 06/22/2009    REFERRING PROVIDER: Ozell Jewell DO.  REFERRING DIAG: Pain in left shoulder.  Cervicalgia.  THERAPY DIAG:  Pain in right ankle and joints of right foot  Rationale for Evaluation and Treatment: Rehabilitation  ONSET DATE: ~About 6 months.  SUBJECTIVE:  SUBJECTIVE STATEMENT: Neck has really been bothering me but the heel has been very painful.  Patient had been doing some outdoor work recently and stepped up on a step and felt excruciating right heel pain.  Pain is very high today.    PERTINENT HISTORY: See above.  PAIN:  Are you having pain? Yes: NPRS scale: Very high/10. Pain location: Right ankle. Pain description: Ache. Aggravating factors: Lying on side. Relieving factors: As above.  PRECAUTIONS: None  RED FLAGS: None   WEIGHT BEARING RESTRICTIONS: No  FALLS:  Has patient fallen in last 6 months? No  LIVING ENVIRONMENT: Lives in: House/apartment  Has following equipment at home: None   PLOF: Independent  PATIENT GOALS:Reduce pain and be able to lie on side  NEXT MD VISIT:   OBJECTIVE:  Note: Objective measures were completed at Evaluation unless otherwise noted.  DIAGNOSTIC FINDINGS:  No fractures. There is mild narrowing of the C4-C5, C5-C6, C6-C7 disc spaces minimal anterior osteophytic formation C5-C6 and C6-C7 mild posterior bony spondylosis with mild C5-6 foraminal narrowing, left more than right foraminal narrowing. C1-C2 articulation and predental space within normal limits   IMPRESSION: Mild degenerative changes of  the cervical spine as described.  There is no evidence of fracture or dislocation. There is no evidence of arthropathy or other focal bone abnormality. Soft tissues are unremarkable.   IMPRESSION: Negative.    PATIENT SURVEYS:  Quick Dash 27.27   POSTURE: Forward head and rounded shoulders.  UPPER EXTREMITY ROM:   Active left shoulder flexion to 140 degrees and ER to 70 degrees (left is 90 degrees).  Bilateral active cervical rotation is 55 degrees.  UPPER EXTREMITY MMT:  Left shoulder ER/IR is a solid 4 to 4+/5.    SHOULDER SPECIAL TESTS: No pain with left shoulder impingement testing   PALPATION:  Tender to palpation over left UT and middle deltoid.  DTR's  Normal UE DTR's.                                                                                                                             TREATMENT DATE:   05/20/24;  Nustep level 3 x 10 minutes f/b Rockerboard x 5 minutes f/b STW/M to right distal calf and Achilles x 8 minutes f/b Vaso on low and IFC at 801-50 Hz on 40% scan x 20 minutes.  Normal modality response following removal of modality   04/27/24  Combo e'stim/US  at 1.50 W/CM2 x 12 minutes to patient's left cervical region f/b STW/M x 11 minutes f/n pre-mod e'stim at 80-150 Hz on constant to left cervical region and  pre-mod e'stim (5 sec on and 5 sec off) at 80-150 Hz x 20 minutes to patient's right affected Achilles.  Normal modality response following removal of modality   PATIENT EDUCATION: Education details: See below. Person educated: Patient Education method: Medical illustrator, handout Education comprehension: verbalized understanding and returned demonstration  HOME EXERCISE PROGRAM:  HOME EXERCISE PROGRAM [ESNSCR2]  PECTORALIS CORNER STRETCH -  Repeat 4 Repetitions, Hold 30 Seconds, Complete 1 Set, Perform 4 Times a Day  90 degree Pec stretch -  Repeat 4 Repetitions, Hold 30 Seconds, Complete 2 Sets, Perform 4 Times a  Day  ASSESSMENT:  CLINICAL IMPRESSION: Treatment focused on right Achilles today as she has been hurting a lot in this area.  She did well with treatment today.  We discussed considering alternative footwear as she wears sandals with a strap over the Achilles.  She states her neck has not been bothering her very much and would like treatments focused on her heel.  ACTIVITY LIMITATIONS: carrying, lifting, and sleeping  PARTICIPATION LIMITATIONS: meal prep, cleaning, and laundry  PERSONAL FACTORS: Time since onset of injury/illness/exacerbation are also affecting patient's functional outcome.   REHAB POTENTIAL: Good  CLINICAL DECISION MAKING: Evolving/moderate complexity  EVALUATION COMPLEXITY: Moderate   GOALS:  SHORT TERM GOALS: Target date: 02/18/24.  Ind with an initial HEP. Goal status: MET   LONG TERM GOALS: Target date: 03/17/24.  Ind with an advanced HEP.  Goal status: MET.    2.  Improve active left shoulder ER to 80 degrees+  Goal status: MET  3.  Improve bilateral active cervical rotation to 65 degrees so she can more easily turn her head while driving.  Goal status:MET to right.  4.  Patient able to sleep on her side.   Baseline:  Goal status: INITIAL  5.  Perform ADL's with pain not > 3/10.  Goal status: INITIAL  6.  Patient walk a community distance with right Achilles pain not exceeding 2/10.  Ongoing.    PLAN:  PT FREQUENCY: 2x/week  PT DURATION: 6 weeks  PLANNED INTERVENTIONS: 97110-Therapeutic exercises, 97530- Therapeutic activity, [id number]- Neuromuscular re-education, 97535- Self Care, [id number]- Manual therapy, [id number]- Electrical stimulation (unattended), 97016- Vasopneumatic device, 97035- Ultrasound, Patient/Family education, Dry Needling, Cryotherapy, and Moist heat  PLAN FOR NEXT SESSION: Calf stretches, continue treatment to neck.  Modalities and STW/M to right Achilles.    Progress Note Reporting Period 02/04/24 to 04/27/24.  See note below for  Objective Data and Assessment of Progress/Goals. Patient progressing well toward goals and is pleased with her progress.  Her pain was low today.     Tammie [name] MPT     [place], Tammie, PT 05/20/2024, 12:39 PM

## 2024-05-25 DIAGNOSIS — M79671 Pain in right foot: Secondary | ICD-10-CM | POA: Diagnosis not present

## 2024-05-25 DIAGNOSIS — M79605 Pain in left leg: Secondary | ICD-10-CM | POA: Diagnosis not present

## 2024-05-25 DIAGNOSIS — M76821 Posterior tibial tendinitis, right leg: Secondary | ICD-10-CM | POA: Diagnosis not present

## 2024-05-27 ENCOUNTER — Ambulatory Visit: Admitting: Physical Therapy

## 2024-05-27 ENCOUNTER — Encounter: Payer: Self-pay | Admitting: Physical Therapy

## 2024-05-27 DIAGNOSIS — M25571 Pain in right ankle and joints of right foot: Secondary | ICD-10-CM | POA: Diagnosis not present

## 2024-05-27 NOTE — Therapy (Signed)
 OUTPATIENT PHYSICAL THERAPY SHOULDER TREATMENT   Patient Name: Tammie [name] MRN: [id number] DOB:1935-08-25, 89 y.o., female Today's Date: 05/27/2024  END OF SESSION:  PT End of Session - 05/27/24 1342     Visit Number 12    Number of Visits 18    Date for PT Re-Evaluation 07/06/24    PT Start Time 0102    PT Stop Time 0200    PT Time Calculation (min) 58 min    Activity Tolerance Patient tolerated treatment well    Behavior During Therapy WFL for tasks assessed/performed               Past Medical History:  Diagnosis Date   Abnormal uterine bleeding    Allergic rhinitis    Allergy    seasonal   Anxiety    panic attacks   Anxiety disorder    Asthma    Cataract    left eye surgery to removed   Cataract    right eye   Colorectal cancer (HCC) 1997   T3, N0   Depression    pt states she isn't depressed anymore, continues paxil  daily   Endometriosis    Fibroid    GERD (gastroesophageal reflux disease)    Hemorrhoids    Hyperlipidemia    diet controlled, no meds   Hypertension    PONV (postoperative nausea and vomiting)    Prediabetes    Seasonal allergies    SVD (spontaneous vaginal delivery)    x 3   Thyroid  disease    Urinary incontinence    Past Surgical History:  Procedure Laterality Date   cataract surgery Left    COLECTOMY  1997   SIGMOID COLECTOMY 1997   COLON SURGERY  1997   COLONOSCOPY     DILATATION & CURETTAGE/HYSTEROSCOPY WITH MYOSURE N/A 11/21/2015   Procedure: DILATATION & CURETTAGE/HYSTEROSCOPY WITH MYOSURE;  Surgeon: Kate Hargis Nearing, MD;  Location: [facility];  Service: Gynecology;  Laterality: N/A;   DILATION AND CURETTAGE OF UTERUS     D & C   POLYPECTOMY     TONSILLECTOMY     Patient Active Problem List   Diagnosis Date Noted   Tick bite of thigh, left, initial encounter 02/07/2023   Venous insufficiency 11/15/2021   Hyperlipidemia 01/15/2018   Diabetes mellitus without complication (HCC) 07/15/2017   Vertigo 07/15/2017    Left leg pain 12/20/2016   Hemorrhoids 12/20/2016   Dermatitis 12/20/2016   Osteopenia 10/02/2015   Systolic murmur 06/21/2015   Hypertension    Panic attacks    History of colorectal cancer    Allergic rhinitis    Asthma    GERD 06/22/2009    REFERRING PROVIDER: Ozell Jewell DO.  REFERRING DIAG: Pain in left shoulder.  Cervicalgia.  THERAPY DIAG:  Pain in right ankle and joints of right foot - Plan: PT plan of care cert/re-cert  Rationale for Evaluation and Treatment: Rehabilitation  ONSET DATE: ~About 6 months.  SUBJECTIVE:  SUBJECTIVE STATEMENT: Heel feels better.  Dr. Marquette gave me an arch support.  PERTINENT HISTORY: See above.  PAIN:  Are you having pain? Yes: NPRS scale: Low/10. Pain location: Right ankle. Pain description: Ache. Aggravating factors: Lying on side. Relieving factors: As above.  PRECAUTIONS: None  RED FLAGS: None   WEIGHT BEARING RESTRICTIONS: No  FALLS:  Has patient fallen in last 6 months? No  LIVING ENVIRONMENT: Lives in: House/apartment  Has following equipment at home: None   PLOF: Independent  PATIENT GOALS:Reduce pain and be able to lie on side  NEXT MD VISIT:   OBJECTIVE:  Note: Objective measures were completed at Evaluation unless otherwise noted.  DIAGNOSTIC FINDINGS:  No fractures. There is mild narrowing of the C4-C5, C5-C6, C6-C7 disc spaces minimal anterior osteophytic formation C5-C6 and C6-C7 mild posterior bony spondylosis with mild C5-6 foraminal narrowing, left more than right foraminal narrowing. C1-C2 articulation and predental space within normal limits   IMPRESSION: Mild degenerative changes of the cervical spine as described.  There is no evidence of fracture or dislocation. There is no evidence of arthropathy or  other focal bone abnormality. Soft tissues are unremarkable.   IMPRESSION: Negative.    PATIENT SURVEYS:  Quick Dash 27.27   POSTURE: Forward head and rounded shoulders.  UPPER EXTREMITY ROM:   Active left shoulder flexion to 140 degrees and ER to 70 degrees (left is 90 degrees).  Bilateral active cervical rotation is 55 degrees.  UPPER EXTREMITY MMT:  Left shoulder ER/IR is a solid 4 to 4+/5.    SHOULDER SPECIAL TESTS: No pain with left shoulder impingement testing   PALPATION:  Tender to palpation over left UT and middle deltoid.  DTR's  Normal UE DTR's.                                                                                                                             TREATMENT DATE:   05/27/24:  US  at 1.50 W/CM2 x 12 minutes to affected right Achilles f/b STW/M x 11 minutes f/b Vaso on low and IFC at 80-150 Hz on 40% scan x 20 minutes.    05/20/24;  Nustep level 3 x 10 minutes f/b Rockerboard x 5 minutes f/b STW/M to right distal calf and Achilles x 8 minutes f/b IFC at 801-50 Hz on 40% scan x 20 minutes.  Normal modality response following removal of modality   04/27/24  Combo e'stim/US  at 1.50 W/CM2 x 12 minutes to patient's left cervical region f/b STW/M x 11 minutes f/n pre-mod e'stim at 80-150 Hz on constant to left cervical region and  pre-mod e'stim (5 sec on and 5 sec off) at 80-150 Hz x 20 minutes to patient's right affected Achilles.  Normal modality response following removal of modality   PATIENT EDUCATION: Education details: See below. Person educated: Patient Education method: Medical illustrator, handout Education comprehension: verbalized understanding and returned demonstration  HOME EXERCISE PROGRAM:  HOME EXERCISE PROGRAM [ESNSCR2]  PECTORALIS CORNER STRETCH -  Repeat 4 Repetitions, Hold 30 Seconds, Complete 1 Set, Perform 4 Times a Day  90 degree Pec stretch -  Repeat 4 Repetitions, Hold 30 Seconds, Complete 2 Sets, Perform 4  Times a Day  ASSESSMENT:  CLINICAL IMPRESSION: Patient reporting a lowered pain-level.  She states an arch support supplied by Dr. Marquette has been very helpful.  Plan to continue 6 more visits.  She did very well with treatment today and felt better following.  ACTIVITY LIMITATIONS: carrying, lifting, and sleeping  PARTICIPATION LIMITATIONS: meal prep, cleaning, and laundry  PERSONAL FACTORS: Time since onset of injury/illness/exacerbation are also affecting patient's functional outcome.   REHAB POTENTIAL: Good  CLINICAL DECISION MAKING: Evolving/moderate complexity  EVALUATION COMPLEXITY: Moderate   GOALS:  SHORT TERM GOALS: Target date: 02/18/24.  Ind with an initial HEP. Goal status: MET   LONG TERM GOALS: Target date: 03/17/24.  Ind with an advanced HEP.  Goal status: MET.    2.  Improve active left shoulder ER to 80 degrees+  Goal status: MET  3.  Improve bilateral active cervical rotation to 65 degrees so she can more easily turn her head while driving.  Goal status:MET to right.  4.  Patient able to sleep on her side.   Baseline:  Goal status: INITIAL  5.  Perform ADL's with pain not > 3/10.  Goal status: INITIAL  6.  Patient walk a community distance with right Achilles pain not exceeding 2/10.  Ongoing.    PLAN:  PT FREQUENCY: 2x/week  PT DURATION: 6 weeks  PLANNED INTERVENTIONS: 97110-Therapeutic exercises, 97530- Therapeutic activity, [id number]- Neuromuscular re-education, 97535- Self Care, 02859- Manual therapy, G0283- Electrical stimulation (unattended), 97016- Vasopneumatic device, 97035- Ultrasound, Patient/Family education, Dry Needling, Cryotherapy, and Moist heat  PLAN FOR NEXT SESSION: Calf stretches, continue treatment to neck.  Modalities and STW/M to right Achilles.         Sherice Ijames, [place], PT 05/27/2024, 2:13 PM

## 2024-06-02 ENCOUNTER — Encounter: Admitting: Internal Medicine

## 2024-06-04 ENCOUNTER — Encounter: Payer: Self-pay | Admitting: *Deleted

## 2024-06-04 ENCOUNTER — Ambulatory Visit: Admitting: *Deleted

## 2024-06-04 DIAGNOSIS — M25571 Pain in right ankle and joints of right foot: Secondary | ICD-10-CM

## 2024-06-04 NOTE — Therapy (Signed)
 OUTPATIENT PHYSICAL THERAPY SHOULDER/RT FOOT TREATMENT   Patient Name: Tammie [name] MRN: [id number] DOB:12-07-1934, 89 y.o., female Today's Date: 06/04/2024  END OF SESSION:  PT End of Session - 06/04/24 1146     Visit Number 13    Number of Visits 18    Date for Recertification  07/06/24    PT Start Time 1146    PT Stop Time 1239    PT Time Calculation (min) 53 min               Past Medical History:  Diagnosis Date   Abnormal uterine bleeding    Allergic rhinitis    Allergy    seasonal   Anxiety    panic attacks   Anxiety disorder    Asthma    Cataract    left eye surgery to removed   Cataract    right eye   Colorectal cancer (HCC) 1997   T3, N0   Depression    pt states she isn't depressed anymore, continues paxil  daily   Endometriosis    Fibroid    GERD (gastroesophageal reflux disease)    Hemorrhoids    Hyperlipidemia    diet controlled, no meds   Hypertension    PONV (postoperative nausea and vomiting)    Prediabetes    Seasonal allergies    SVD (spontaneous vaginal delivery)    x 3   Thyroid  disease    Urinary incontinence    Past Surgical History:  Procedure Laterality Date   cataract surgery Left    COLECTOMY  1997   SIGMOID COLECTOMY 1997   COLON SURGERY  1997   COLONOSCOPY     DILATATION & CURETTAGE/HYSTEROSCOPY WITH MYOSURE N/A 11/21/2015   Procedure: DILATATION & CURETTAGE/HYSTEROSCOPY WITH MYOSURE;  Surgeon: Kate Hargis Nearing, MD;  Location: [facility];  Service: Gynecology;  Laterality: N/A;   DILATION AND CURETTAGE OF UTERUS     D & C   POLYPECTOMY     TONSILLECTOMY     Patient Active Problem List   Diagnosis Date Noted   Tick bite of thigh, left, initial encounter 02/07/2023   Venous insufficiency 11/15/2021   Hyperlipidemia 01/15/2018   Diabetes mellitus without complication (HCC) 07/15/2017   Vertigo 07/15/2017   Left leg pain 12/20/2016   Hemorrhoids 12/20/2016   Dermatitis 12/20/2016   Osteopenia 10/02/2015    Systolic murmur 06/21/2015   Hypertension    Panic attacks    History of colorectal cancer    Allergic rhinitis    Asthma    GERD 06/22/2009    REFERRING PROVIDER: Ozell Jewell DO.  REFERRING DIAG: Pain in left shoulder.  Cervicalgia.  THERAPY DIAG:  Pain in right ankle and joints of right foot  Rationale for Evaluation and Treatment: Rehabilitation  ONSET DATE: ~About 6 months.  SUBJECTIVE:  SUBJECTIVE STATEMENT: Heel feels better.  Dr. Marquette gave me an arch support. Doing better with less pain RT achilles PERTINENT HISTORY: See above.  PAIN:  Are you having pain? Yes: NPRS scale: Low/10. Pain location: Right ankle. Pain description: Ache. Aggravating factors: Lying on side. Relieving factors: As above.  PRECAUTIONS: None  RED FLAGS: None   WEIGHT BEARING RESTRICTIONS: No  FALLS:  Has patient fallen in last 6 months? No  LIVING ENVIRONMENT: Lives in: House/apartment  Has following equipment at home: None   PLOF: Independent  PATIENT GOALS:Reduce pain and be able to lie on side  NEXT MD VISIT:   OBJECTIVE:  Note: Objective measures were completed at Evaluation unless otherwise noted.  DIAGNOSTIC FINDINGS:  No fractures. There is mild narrowing of the C4-C5, C5-C6, C6-C7 disc spaces minimal anterior osteophytic formation C5-C6 and C6-C7 mild posterior bony spondylosis with mild C5-6 foraminal narrowing, left more than right foraminal narrowing. C1-C2 articulation and predental space within normal limits   IMPRESSION: Mild degenerative changes of the cervical spine as described.  There is no evidence of fracture or dislocation. There is no evidence of arthropathy or other focal bone abnormality. Soft tissues are unremarkable.   IMPRESSION: Negative.    PATIENT  SURVEYS:  Quick Dash 27.27   POSTURE: Forward head and rounded shoulders.  UPPER EXTREMITY ROM:   Active left shoulder flexion to 140 degrees and ER to 70 degrees (left is 90 degrees).  Bilateral active cervical rotation is 55 degrees.  UPPER EXTREMITY MMT:  Left shoulder ER/IR is a solid 4 to 4+/5.    SHOULDER SPECIAL TESTS: No pain with left shoulder impingement testing   PALPATION:  Tender to palpation over left UT and middle deltoid.  DTR's  Normal UE DTR's.                                                                                                                             TREATMENT DATE:   06/04/24:  US  at 1.50 W/CM2 x 12 minutes to affected right Achilles   STW/M x 12 minutes to RT lower calf and achilles  Vaso on low and IFC at 80-150 Hz on 40% scan x 15 minutes.    05/20/24;  Nustep level 3 x 10 minutes f/b Rockerboard x 5 minutes f/b STW/M to right distal calf and Achilles x 8 minutes f/b IFC at 801-50 Hz on 40% scan x 20 minutes.  Normal modality response following removal of modality   04/27/24  Combo e'stim/US  at 1.50 W/CM2 x 12 minutes to patient's left cervical region f/b STW/M x 11 minutes f/n pre-mod e'stim at 80-150 Hz on constant to left cervical region and  pre-mod e'stim (5 sec on and 5 sec off) at 80-150 Hz x 20 minutes to patient's right affected Achilles.  Normal modality response following removal of modality   PATIENT EDUCATION: Education details: See below. Person educated: Patient Education method: Medical illustrator, handout Education comprehension: verbalized  understanding and returned demonstration  HOME EXERCISE PROGRAM:  HOME EXERCISE PROGRAM [ESNSCR2]  PECTORALIS CORNER STRETCH -  Repeat 4 Repetitions, Hold 30 Seconds, Complete 1 Set, Perform 4 Times a Day  90 degree Pec stretch -  Repeat 4 Repetitions, Hold 30 Seconds, Complete 2 Sets, Perform 4 Times a Day  ASSESSMENT:  CLINICAL IMPRESSION: Patient arrived today  doing fairly well with RT foot with decreased pain overall. Rx focused on US  combo, STW, as well as IFC and Vaso to RT lower calf and achilles. She is still using the  arch support supplied by Dr. Marquette and reports it is helpful.  She did very well with treatment today and felt better following.  ACTIVITY LIMITATIONS: carrying, lifting, and sleeping  PARTICIPATION LIMITATIONS: meal prep, cleaning, and laundry  PERSONAL FACTORS: Time since onset of injury/illness/exacerbation are also affecting patient's functional outcome.   REHAB POTENTIAL: Good  CLINICAL DECISION MAKING: Evolving/moderate complexity  EVALUATION COMPLEXITY: Moderate   GOALS:  SHORT TERM GOALS: Target date: 02/18/24.  Ind with an initial HEP. Goal status: MET   LONG TERM GOALS: Target date: 03/17/24.  Ind with an advanced HEP.  Goal status: MET.    2.  Improve active left shoulder ER to 80 degrees+  Goal status: MET  3.  Improve bilateral active cervical rotation to 65 degrees so she can more easily turn her head while driving.  Goal status:MET to right.  4.  Patient able to sleep on her side.   Baseline:  Goal status: INITIAL  5.  Perform ADL's with pain not > 3/10.  Goal status: INITIAL  6.  Patient walk a community distance with right Achilles pain not exceeding 2/10.  Ongoing.    PLAN:  PT FREQUENCY: 2x/week  PT DURATION: 6 weeks  PLANNED INTERVENTIONS: 97110-Therapeutic exercises, 97530- Therapeutic activity, [id number]- Neuromuscular re-education, 97535- Self Care, 02859- Manual therapy, G0283- Electrical stimulation (unattended), 97016- Vasopneumatic device, 97035- Ultrasound, Patient/Family education, Dry Needling, Cryotherapy, and Moist heat  PLAN FOR NEXT SESSION: Calf stretches, continue treatment to neck.  Modalities and STW/M to right Achilles.         Michaella Imai,[place], PTA 06/04/2024, 12:40 PM

## 2024-06-11 ENCOUNTER — Encounter: Payer: Self-pay | Admitting: *Deleted

## 2024-06-11 ENCOUNTER — Ambulatory Visit: Admitting: *Deleted

## 2024-06-11 DIAGNOSIS — G8929 Other chronic pain: Secondary | ICD-10-CM

## 2024-06-11 DIAGNOSIS — M25571 Pain in right ankle and joints of right foot: Secondary | ICD-10-CM | POA: Diagnosis not present

## 2024-06-11 DIAGNOSIS — M542 Cervicalgia: Secondary | ICD-10-CM

## 2024-06-11 NOTE — Therapy (Signed)
 OUTPATIENT PHYSICAL THERAPY SHOULDER/RT FOOT TREATMENT   Patient Name: Tammie [name] MRN: [id number] DOB:Jun 29, 1935, 89 y.o., female Today's Date: 06/11/2024  END OF SESSION:  PT End of Session - 06/11/24 0929     Visit Number 14    Number of Visits 18    Date for Recertification  07/06/24    PT Start Time 0929    PT Stop Time 1020    PT Time Calculation (min) 51 min               Past Medical History:  Diagnosis Date   Abnormal uterine bleeding    Allergic rhinitis    Allergy    seasonal   Anxiety    panic attacks   Anxiety disorder    Asthma    Cataract    left eye surgery to removed   Cataract    right eye   Colorectal cancer (HCC) 1997   T3, N0   Depression    pt states she isn't depressed anymore, continues paxil  daily   Endometriosis    Fibroid    GERD (gastroesophageal reflux disease)    Hemorrhoids    Hyperlipidemia    diet controlled, no meds   Hypertension    PONV (postoperative nausea and vomiting)    Prediabetes    Seasonal allergies    SVD (spontaneous vaginal delivery)    x 3   Thyroid  disease    Urinary incontinence    Past Surgical History:  Procedure Laterality Date   cataract surgery Left    COLECTOMY  1997   SIGMOID COLECTOMY 1997   COLON SURGERY  1997   COLONOSCOPY     DILATATION & CURETTAGE/HYSTEROSCOPY WITH MYOSURE N/A 11/21/2015   Procedure: DILATATION & CURETTAGE/HYSTEROSCOPY WITH MYOSURE;  Surgeon: Kate Hargis Nearing, MD;  Location: [facility];  Service: Gynecology;  Laterality: N/A;   DILATION AND CURETTAGE OF UTERUS     D & C   POLYPECTOMY     TONSILLECTOMY     Patient Active Problem List   Diagnosis Date Noted   Tick bite of thigh, left, initial encounter 02/07/2023   Venous insufficiency 11/15/2021   Hyperlipidemia 01/15/2018   Diabetes mellitus without complication (HCC) 07/15/2017   Vertigo 07/15/2017   Left leg pain 12/20/2016   Hemorrhoids 12/20/2016   Dermatitis 12/20/2016   Osteopenia 10/02/2015    Systolic murmur 06/21/2015   Hypertension    Panic attacks    History of colorectal cancer    Allergic rhinitis    Asthma    GERD 06/22/2009    REFERRING PROVIDER: Ozell Jewell DO.  REFERRING DIAG: Pain in left shoulder.  Cervicalgia.  THERAPY DIAG:  Chronic left shoulder pain  Cervicalgia  Pain in right ankle and joints of right foot  Rationale for Evaluation and Treatment: Rehabilitation  ONSET DATE: ~About 6 months.  SUBJECTIVE:  SUBJECTIVE STATEMENT: Heel feels okay, but I fell and sprained my RT foot/ankle. PERTINENT HISTORY: See above.  PAIN:  Are you having pain? Yes: NPRS scale: Low/10. Pain location: Right ankle. Pain description: Ache. Aggravating factors: Lying on side. Relieving factors: As above.  PRECAUTIONS: None  RED FLAGS: None   WEIGHT BEARING RESTRICTIONS: No  FALLS:  Has patient fallen in last 6 months? No  LIVING ENVIRONMENT: Lives in: House/apartment  Has following equipment at home: None   PLOF: Independent  PATIENT GOALS:Reduce pain and be able to lie on side  NEXT MD VISIT:   OBJECTIVE:  Note: Objective measures were completed at Evaluation unless otherwise noted.  DIAGNOSTIC FINDINGS:  No fractures. There is mild narrowing of the C4-C5, C5-C6, C6-C7 disc spaces minimal anterior osteophytic formation C5-C6 and C6-C7 mild posterior bony spondylosis with mild C5-6 foraminal narrowing, left more than right foraminal narrowing. C1-C2 articulation and predental space within normal limits   IMPRESSION: Mild degenerative changes of the cervical spine as described.  There is no evidence of fracture or dislocation. There is no evidence of arthropathy or other focal bone abnormality. Soft tissues are unremarkable.   IMPRESSION: Negative.     PATIENT SURVEYS:  Quick Dash 27.27   POSTURE: Forward head and rounded shoulders.  UPPER EXTREMITY ROM:   Active left shoulder flexion to 140 degrees and ER to 70 degrees (left is 90 degrees).  Bilateral active cervical rotation is 55 degrees.  UPPER EXTREMITY MMT:  Left shoulder ER/IR is a solid 4 to 4+/5.    SHOULDER SPECIAL TESTS: No pain with left shoulder impingement testing   PALPATION:  Tender to palpation over left UT and middle deltoid.  DTR's  Normal UE DTR's.                                                                                                                             TREATMENT DATE:   06/11/24:  US  at 1.50 W/CM2 x 10 minutes to affected right Achilles   STW/M x 10 minutes to RT lower calf and achilles  Vaso on low and IFC at 80-150 Hz on 40% scan x 15 minutes.   AROM exs LT foot ankle pumps, ev/inv, and toe scrunches RT foot    05/20/24;  Nustep level 3 x 10 minutes f/b Rockerboard x 5 minutes f/b STW/M to right distal calf and Achilles x 8 minutes f/b IFC at 801-50 Hz on 40% scan x 20 minutes.  Normal modality response following removal of modality   04/27/24  Combo e'stim/US  at 1.50 W/CM2 x 12 minutes to patient's left cervical region f/b STW/M x 11 minutes f/n pre-mod e'stim at 80-150 Hz on constant to left cervical region and  pre-mod e'stim (5 sec on and 5 sec off) at 80-150 Hz x 20 minutes to patient's right affected Achilles.  Normal modality response following removal of modality   PATIENT EDUCATION: Education details: See below. Person educated: Patient Education method:  Explanation and Demonstration, handout Education comprehension: verbalized understanding and returned demonstration  HOME EXERCISE PROGRAM:  HOME EXERCISE PROGRAM [ESNSCR2]  PECTORALIS CORNER STRETCH -  Repeat 4 Repetitions, Hold 30 Seconds, Complete 1 Set, Perform 4 Times a Day  90 degree Pec stretch -  Repeat 4 Repetitions, Hold 30 Seconds, Complete 2 Sets,  Perform 4 Times a Day  ASSESSMENT:  CLINICAL IMPRESSION: Patient arrived today not doing well with RT foot due to falling 3days ago and sprained RT foot. Pt with increased swelling around foot noted with some bruising as well, but minimal pain. Rx focused on US  combo, STW, as well as IFC and Vaso to RT lower calf and achilles. She did well with Rx and will f/u with MD next week   ACTIVITY LIMITATIONS: carrying, lifting, and sleeping  PARTICIPATION LIMITATIONS: meal prep, cleaning, and laundry  PERSONAL FACTORS: Time since onset of injury/illness/exacerbation are also affecting patient's functional outcome.   REHAB POTENTIAL: Good  CLINICAL DECISION MAKING: Evolving/moderate complexity  EVALUATION COMPLEXITY: Moderate   GOALS:  SHORT TERM GOALS: Target date: 02/18/24.  Ind with an initial HEP. Goal status: MET   LONG TERM GOALS: Target date: 03/17/24.  Ind with an advanced HEP.  Goal status: MET.    2.  Improve active left shoulder ER to 80 degrees+  Goal status: MET  3.  Improve bilateral active cervical rotation to 65 degrees so she can more easily turn her head while driving.  Goal status:MET to right.  4.  Patient able to sleep on her side.   Baseline:  Goal status: INITIAL  5.  Perform ADL's with pain not > 3/10.  Goal status: INITIAL  6.  Patient walk a community distance with right Achilles pain not exceeding 2/10.  Ongoing.    PLAN:  PT FREQUENCY: 2x/week  PT DURATION: 6 weeks  PLANNED INTERVENTIONS: 97110-Therapeutic exercises, 97530- Therapeutic activity, [id number]- Neuromuscular re-education, 97535- Self Care, [id number]- Manual therapy, [id number]- Electrical stimulation (unattended), 97016- Vasopneumatic device, 97035- Ultrasound, Patient/Family education, Dry Needling, Cryotherapy, and Moist heat  PLAN FOR NEXT SESSION: Calf stretches, continue treatment to neck.  Modalities and STW/M to right Achilles.         Loriel Diehl,CHRIS, PTA 06/11/2024, 1:12 PM

## 2024-06-15 ENCOUNTER — Encounter: Admitting: Physical Therapy

## 2024-06-17 DIAGNOSIS — M79605 Pain in left leg: Secondary | ICD-10-CM | POA: Diagnosis not present

## 2024-06-17 DIAGNOSIS — M79671 Pain in right foot: Secondary | ICD-10-CM | POA: Diagnosis not present

## 2024-06-17 DIAGNOSIS — M76821 Posterior tibial tendinitis, right leg: Secondary | ICD-10-CM | POA: Diagnosis not present

## 2024-06-23 ENCOUNTER — Ambulatory Visit: Attending: Sports Medicine | Admitting: Physical Therapy

## 2024-06-23 DIAGNOSIS — M25571 Pain in right ankle and joints of right foot: Secondary | ICD-10-CM | POA: Insufficient documentation

## 2024-06-23 NOTE — Therapy (Signed)
 OUTPATIENT PHYSICAL THERAPY SHOULDER/RT FOOT TREATMENT   Patient Name: Tammie [name] MRN: [id number] DOB:Feb 27, 1935, 89 y.o., female Today's Date: 06/23/2024  END OF SESSION:  PT End of Session - 06/23/24 1152     Visit Number 15    Number of Visits 18    Date for Recertification  07/06/24    PT Start Time 1100    Activity Tolerance Patient tolerated treatment well    Behavior During Therapy [facility] for tasks assessed/performed               Past Medical History:  Diagnosis Date   Abnormal uterine bleeding    Allergic rhinitis    Allergy    seasonal   Anxiety    panic attacks   Anxiety disorder    Asthma    Cataract    left eye surgery to removed   Cataract    right eye   Colorectal cancer (HCC) 1997   T3, N0   Depression    pt states she isn't depressed anymore, continues paxil  daily   Endometriosis    Fibroid    GERD (gastroesophageal reflux disease)    Hemorrhoids    Hyperlipidemia    diet controlled, no meds   Hypertension    PONV (postoperative nausea and vomiting)    Prediabetes    Seasonal allergies    SVD (spontaneous vaginal delivery)    x 3   Thyroid  disease    Urinary incontinence    Past Surgical History:  Procedure Laterality Date   cataract surgery Left    COLECTOMY  1997   SIGMOID COLECTOMY 1997   COLON SURGERY  1997   COLONOSCOPY     DILATATION & CURETTAGE/HYSTEROSCOPY WITH MYOSURE N/A 11/21/2015   Procedure: DILATATION & CURETTAGE/HYSTEROSCOPY WITH MYOSURE;  Surgeon: Kate Hargis Nearing, MD;  Location: [facility];  Service: Gynecology;  Laterality: N/A;   DILATION AND CURETTAGE OF UTERUS     D & C   POLYPECTOMY     TONSILLECTOMY     Patient Active Problem List   Diagnosis Date Noted   Tick bite of thigh, left, initial encounter 02/07/2023   Venous insufficiency 11/15/2021   Hyperlipidemia 01/15/2018   Diabetes mellitus without complication (HCC) 07/15/2017   Vertigo 07/15/2017   Left leg pain 12/20/2016   Hemorrhoids  12/20/2016   Dermatitis 12/20/2016   Osteopenia 10/02/2015   Systolic murmur 06/21/2015   Hypertension    Panic attacks    History of colorectal cancer    Allergic rhinitis    Asthma    GERD 06/22/2009    REFERRING PROVIDER: Ozell Jewell DO.  REFERRING DIAG: Pain in left shoulder.  Cervicalgia.  THERAPY DIAG:  Pain in right ankle and joints of right foot  Rationale for Evaluation and Treatment: Rehabilitation  ONSET DATE: ~About 6 months.  SUBJECTIVE:  SUBJECTIVE STATEMENT: [place]an buddy taping right 2-3rd toes helps a lot for toe injury due to fall catching shoe on a crack.  Pain in heel about a 4.   PERTINENT HISTORY: See above.  PAIN:  Are you having pain? Yes: NPRS scale: 4/10. Pain location: Right ankle. Pain description: Ache. Aggravating factors: Lying on side. Relieving factors: As above.  PRECAUTIONS: None  RED FLAGS: None   WEIGHT BEARING RESTRICTIONS: No  FALLS:  Has patient fallen in last 6 months? No  LIVING ENVIRONMENT: Lives in: House/apartment  Has following equipment at home: None   PLOF: Independent  PATIENT GOALS:Reduce pain and be able to lie on side  NEXT MD VISIT:   OBJECTIVE:  Note: Objective measures were completed at Evaluation unless otherwise noted.  DIAGNOSTIC FINDINGS:  No fractures. There is mild narrowing of the C4-C5, C5-C6, C6-C7 disc spaces minimal anterior osteophytic formation C5-C6 and C6-C7 mild posterior bony spondylosis with mild C5-6 foraminal narrowing, left more than right foraminal narrowing. C1-C2 articulation and predental space within normal limits   IMPRESSION: Mild degenerative changes of the cervical spine as described.  There is no evidence of fracture or dislocation. There is no evidence of arthropathy or other  focal bone abnormality. Soft tissues are unremarkable.   IMPRESSION: Negative.    PATIENT SURVEYS:  Quick Dash 27.27   POSTURE: Forward head and rounded shoulders.  UPPER EXTREMITY ROM:   Active left shoulder flexion to 140 degrees and ER to 70 degrees (left is 90 degrees).  Bilateral active cervical rotation is 55 degrees.  UPPER EXTREMITY MMT:  Left shoulder ER/IR is a solid 4 to 4+/5.    SHOULDER SPECIAL TESTS: No pain with left shoulder impingement testing   PALPATION:  Tender to palpation over left UT and middle deltoid.  DTR's  Normal UE DTR's.                                                                                                                             TREATMENT DATE:   06/23/24:  Nustep level 2 x 8 minutes f/b STW/M x 15 minutes to patient's right calf and Achilles f/b IFC at 40% scan and vasopneumtic x 20 minutes.  Normal modality resposne following removal of modality.   06/11/24:  US  at 1.50 W/CM2 x 10 minutes to affected right Achilles   STW/M x 10 minutes to RT lower calf and achilles  Vaso on low and IFC at 80-150 Hz on 40% scan x 15 minutes.   AROM exs LT foot ankle pumps, ev/inv, and toe scrunches RT foot    05/20/24;  Nustep level 3 x 10 minutes f/b Rockerboard x 5 minutes f/b STW/M to right distal calf and Achilles x 8 minutes f/b IFC at 801-50 Hz on 40% scan x 20 minutes.  Normal modality response following removal of modality   04/27/24  Combo e'stim/US  at 1.50 W/CM2 x 12 minutes to patient's left cervical region f/b STW/M x  11 minutes f/n pre-mod e'stim at 80-150 Hz on constant to left cervical region and  pre-mod e'stim (5 sec on and 5 sec off) at 80-150 Hz x 20 minutes to patient's right affected Achilles.  Normal modality response following removal of modality   PATIENT EDUCATION: Education details: See below. Person educated: Patient Education method: Medical illustrator, handout Education comprehension: verbalized  understanding and returned demonstration  HOME EXERCISE PROGRAM:  HOME EXERCISE PROGRAM [ESNSCR2]  PECTORALIS CORNER STRETCH -  Repeat 4 Repetitions, Hold 30 Seconds, Complete 1 Set, Perform 4 Times a Day  90 degree Pec stretch -  Repeat 4 Repetitions, Hold 30 Seconds, Complete 2 Sets, Perform 4 Times a Day  ASSESSMENT:  CLINICAL IMPRESSION: Patient did very well with treatment today.  Her heel pain had flared-up from her fall and is nor doing much better.  She felt good after treatment today.    ACTIVITY LIMITATIONS: carrying, lifting, and sleeping  PARTICIPATION LIMITATIONS: meal prep, cleaning, and laundry  PERSONAL FACTORS: Time since onset of injury/illness/exacerbation are also affecting patient's functional outcome.   REHAB POTENTIAL: Good  CLINICAL DECISION MAKING: Evolving/moderate complexity  EVALUATION COMPLEXITY: Moderate   GOALS:  SHORT TERM GOALS: Target date: 02/18/24.  Ind with an initial HEP. Goal status: MET   LONG TERM GOALS: Target date: 03/17/24.  Ind with an advanced HEP.  Goal status: MET.    2.  Improve active left shoulder ER to 80 degrees+  Goal status: MET  3.  Improve bilateral active cervical rotation to 65 degrees so she can more easily turn her head while driving.  Goal status:MET to right.  4.  Patient able to sleep on her side.   Baseline:  Goal status: INITIAL  5.  Perform ADL's with pain not > 3/10.  Goal status: INITIAL  6.  Patient walk a community distance with right Achilles pain not exceeding 2/10.  Ongoing.    PLAN:  PT FREQUENCY: 2x/week  PT DURATION: 6 weeks  PLANNED INTERVENTIONS: 97110-Therapeutic exercises, 97530- Therapeutic activity, [id number]- Neuromuscular re-education, 97535- Self Care, 02859- Manual therapy, G0283- Electrical stimulation (unattended), 97016- Vasopneumatic device, 97035- Ultrasound, Patient/Family education, Dry Needling, Cryotherapy, and Moist heat  PLAN FOR NEXT SESSION: Calf stretches,  continue treatment to neck.  Modalities and STW/M to right Achilles.         Micky Overturf, [place], PT 06/23/2024, 11:52 AM

## 2024-06-29 ENCOUNTER — Encounter: Admitting: Physical Therapy

## 2024-07-01 ENCOUNTER — Ambulatory Visit: Admitting: Family Medicine

## 2024-07-01 ENCOUNTER — Other Ambulatory Visit: Payer: Self-pay | Admitting: Family Medicine

## 2024-07-01 VITALS — BP 130/70 | HR 64 | Temp 98.1°F | Ht 64.0 in | Wt 164.8 lb

## 2024-07-01 DIAGNOSIS — E119 Type 2 diabetes mellitus without complications: Secondary | ICD-10-CM | POA: Diagnosis not present

## 2024-07-01 DIAGNOSIS — K219 Gastro-esophageal reflux disease without esophagitis: Secondary | ICD-10-CM

## 2024-07-01 DIAGNOSIS — R42 Dizziness and giddiness: Secondary | ICD-10-CM

## 2024-07-01 DIAGNOSIS — F410 Panic disorder [episodic paroxysmal anxiety] without agoraphobia: Secondary | ICD-10-CM | POA: Diagnosis not present

## 2024-07-01 DIAGNOSIS — J45909 Unspecified asthma, uncomplicated: Secondary | ICD-10-CM | POA: Diagnosis not present

## 2024-07-01 DIAGNOSIS — Z0000 Encounter for general adult medical examination without abnormal findings: Secondary | ICD-10-CM | POA: Diagnosis not present

## 2024-07-01 DIAGNOSIS — E785 Hyperlipidemia, unspecified: Secondary | ICD-10-CM

## 2024-07-01 DIAGNOSIS — I10 Essential (primary) hypertension: Secondary | ICD-10-CM

## 2024-07-01 MED ORDER — ALBUTEROL SULFATE HFA 108 (90 BASE) MCG/ACT IN AERS: 2.0000 | INHALATION_SPRAY | Freq: Four times a day (QID) | RESPIRATORY_TRACT | 2 refills | Status: AC | PRN

## 2024-07-01 NOTE — Patient Instructions (Signed)
 Schedule a lab visit at the check out desk within 2 weeks. Return for future fasting labs meaning nothing but water after midnight please. Ok to take your medications with water.   Glad you are doing so well! Keep up the good work and happy early birthday!   Check with pharmacy to see if you had shingrix- if you find the dates send me a copy  Recommended follow up: Return in about 6 months (around 12/30/2024) for followup or sooner if needed.Schedule b4 you leave.

## 2024-07-01 NOTE — Progress Notes (Signed)
 Phone [phone]   Subjective:  Patient presents today for their annual physical. Chief complaint-noted.   See problem oriented charting- ROS- full  review of systems was completed and negative except for topics noted under acute/chronic concerns   The following were reviewed and entered/updated in epic: Past Medical History:  Diagnosis Date   Abnormal uterine bleeding    Allergic rhinitis    Allergy    seasonal   Anxiety    panic attacks   Anxiety disorder    Asthma    Cataract    left eye surgery to removed   Cataract    right eye   Colorectal cancer (HCC) 1997   T3, N0   Depression    pt states she isn't depressed anymore, continues paxil  daily   Endometriosis    Fibroid    GERD (gastroesophageal reflux disease)    Hemorrhoids    Hyperlipidemia    diet controlled, no meds   Hypertension    PONV (postoperative nausea and vomiting)    Prediabetes    Seasonal allergies    SVD (spontaneous vaginal delivery)    x 3   Thyroid  disease    Urinary incontinence    Patient Active Problem List   Diagnosis Date Noted   Diabetes mellitus without complication (HCC) 07/15/2017    Priority: High   Osteopenia 10/02/2015    Priority: High   Venous insufficiency 11/15/2021    Priority: Medium    Hyperlipidemia 01/15/2018    Priority: Medium    Vertigo 07/15/2017    Priority: Medium    Hypertension     Priority: Medium    Panic attacks     Priority: Medium    History of colorectal cancer     Priority: Medium    Asthma     Priority: Medium    Left leg pain 12/20/2016    Priority: Low   Hemorrhoids 12/20/2016    Priority: Low   Dermatitis 12/20/2016    Priority: Low   Systolic murmur 06/21/2015    Priority: Low   Allergic rhinitis     Priority: Low   GERD 06/22/2009    Priority: Low   Tick bite of thigh, left, initial encounter 02/07/2023   Past Surgical History:  Procedure Laterality Date   cataract surgery Left    COLECTOMY  1997   SIGMOID COLECTOMY  1997   COLON SURGERY  1997   COLONOSCOPY     DILATATION & CURETTAGE/HYSTEROSCOPY WITH MYOSURE N/A 11/21/2015   Procedure: DILATATION & CURETTAGE/HYSTEROSCOPY WITH MYOSURE;  Surgeon: Kate Hargis Nearing, MD;  Location: [facility];  Service: Gynecology;  Laterality: N/A;   DILATION AND CURETTAGE OF UTERUS     D & C   POLYPECTOMY     TONSILLECTOMY      Family History  Problem Relation Age of Onset   Sudden death Brother 65       Apparent DVT/PE. all males   Heart attack Brother    Varicose Veins Mother    Hypertension Father    Sudden death Cousin 59       Paternal cousin -dvt/pe 7 all males   Coronary artery disease Cousin 84       Paternal cousin, MI   Breast cancer Daughter 27   Stroke Maternal Grandmother    Stroke Maternal Grandfather    Early death Paternal Grandmother    Tuberculosis Paternal Grandmother    Early death Paternal Grandfather    Tuberculosis Paternal Grandfather    Hypertension Son  Colon cancer Neg Hx    Rectal cancer Neg Hx    Stomach cancer Neg Hx     Medications- reviewed and updated Current Outpatient Medications  Medication Sig Dispense Refill   amLODipine  (NORVASC ) 10 MG tablet TAKE ONE TABLET ONCE DAILY 90 tablet 3   augmented betamethasone dipropionate (DIPROLENE-AF) 0.05 % cream      clobetasol  (TEMOVATE ) 0.05 % external solution Apply 1 application topically 2 (two) times daily. For 7-10 days up to twice a day maximum. Use only on scalp 50 mL 0   fluticasone  furoate-vilanterol (BREO ELLIPTA ) 100-25 MCG/ACT AEPB Inhale 1 puff into the lungs daily. 1 each 11   hydrocortisone  (ANUSOL -HC) 2.5 % rectal cream Place 1 application rectally 2 times daily as needed for hemorrhoids. 28.35 g 1   lisinopril -hydrochlorothiazide  (ZESTORETIC ) 20-12.5 MG tablet TAKE 1 TABLET DAILY 90 tablet 3   omeprazole  (PRILOSEC) 20 MG capsule TAKE ONE CAPSULE ONCE DAILY 90 capsule 3   PARoxetine  (PAXIL ) 20 MG tablet TAKE ONE TABLET EVERY DAY 90 tablet 3   Vitamin D-Vitamin K  (VITAMIN K2-VITAMIN D3 PO) Take by mouth. Drops     albuterol  (VENTOLIN  HFA) 108 (90 Base) MCG/ACT inhaler Inhale 2 puffs into the lungs every 6 (six) hours as needed for wheezing or shortness of breath. 18 g 2   meclizine  (ANTIVERT ) 25 MG tablet Take 1 tablet (25 mg total) by mouth 2 (two) times daily as needed (vertigo). (Patient not taking: Reported on 07/01/2024) 30 tablet 0   No current facility-administered medications for this visit.    Allergies-reviewed and updated Allergies  Allergen Reactions   Erythromycin     unknown   Fentanyl  Hives   Heparin     questionable, unknown   Hydrocodone-Acetaminophen  Hives   Meperidine Hcl     unknown   Penicillins     Has patient had a PCN reaction causing immediate rash, facial/tongue/throat swelling, SOB or lightheadedness with hypotension: no Has patient had a PCN reaction causing severe rash involving mucus membranes or skin necrosis: no Has patient had a PCN reaction that required hospitalization no Has patient had a PCN reaction occurring within the last 10 years: no If all of the above answers are NO, then may proceed with Cephalosporin use.    Procaine Hcl     Could not sleep   Promethazine Hcl     unknown   Temazepam Hives    unknown    Social History   Social History Narrative   Divorced (ex husband later passed- they were close) Lives alone.  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 Oropharynx normal Neck: no thyromegaly CV: RRR no murmurs rubs or gallops Lungs: CTAB no crackles, wheeze, rhonchi Abdomen: soft/nontender/nondistended/normal bowel sounds. No rebound or guarding.  Ext: no edema Skin: warm, dry Neuro:  grossly normal, moves all extremities, PERRLA  Diabetic foot exam was performed with the following findings:   No deformities, ulcerations, or other skin breakdown Normal sensation of 10g monofilament Intact posterior tibialis and dorsalis pedis pulses Taping on 2nd and 3rd toe on right from prior fracture       Assessment and Plan   89 y.o. female presenting for annual physical.  Health Maintenance counseling: 1. Anticipatory guidance: Patient counseled regarding regular dental exams - next week for q6 months, eye exams - yearly,  avoiding smoking and second hand smoke , limiting alcohol to 1 beverage per day- none , no illicit drugs .   2. Risk factor reduction:  Advised patient of need for regular exercise and diet rich and fruits and vegetables to reduce risk of heart attack and stroke.  Exercise- exercise limited due to foot and ankle- hoping to increase after healing.  Diet/weight management-down 2 lbs in last year despite sedentary activity.  Wt Readings from Last 3 Encounters:  07/01/24 164 lb 12.8 oz (74.8 kg)  02/04/24 169 lb (76.7 kg)  01/06/24 169 lb (76.7 kg)  3. Immunizations/screenings/ancillary studies- shingrix at pharmacy, holding off on flu and COVID -thinks may have had shingrix at pharmacy Immunization History  Administered Date(s) Administered   Pneumococcal Conjugate-13 08/09/2014   Pneumococcal Polysaccharide-23 10/19/2015   Tdap 06/02/2023   4. Cervical cancer screening- past age based screening recommendations  5. Breast cancer screening-  breast exam - self exams- and mammogram - has stopped these 6. Colon cancer screening - History of colorectal cancer status  post surgical removal and chemotherapy-last colonoscopy January 2016 and reports was told needed no further colonoscopy. No blood or discharge from rectum  7. Skin cancer screening- sees dermatology. advised regular sunscreen use. Denies worrisome, changing, or new skin lesions.  8. Birth control/STD check- not dating nor sexually active 9. Osteoporosis screening at 65- see below 10. Smoking associated screening - never smoker  Status of chronic or acute concerns   #patient struggling with right ankle pain with her achilles for 8 months, going to PT from Dr. Marquette- had already been seeing her for her neck prior. Had been improving but after fall worsened again  #broken toe on right foot x1 month after fall - buddy taping- but encouraged her to change it- she had been given the tape. Close follow up next week  # Diabetes-New diagnosis 11/29/2021 with A1c 7.1 S: Medication:Diet controlled Lab Results  Component Value Date   HGBA1C 6.8 (H) 01/06/2024   HGBA1C 6.4 06/10/2023   HGBA1C 6.5 11/21/2022  A/P: hopefully stable- update a1c today. Continue without meds for now . Feels could improve diet  #hypertension with history of orthostatic issues and blood pressure goal less than 150/90 S: medication: Amlodipine  10 mg, lisinopril  hydrochlorothiazide  20-12.5 mg (prior 25 mg but urinary symptoms with nocturia) Home readings #s: not recently checking BP Readings from Last 3 Encounters:  07/01/24 130/70  01/06/24 136/80  09/04/23 130/64  A/P: doing very well lately- continue current medications    #hyperlipidemia-have opted to not start medication for primary prevention over age 65 S: Medication:none Lab Results  Component Value Date   CHOL 166 06/10/2023   HDL 35.40 (L) 06/10/2023   LDLCALC 107 (H) 06/10/2023   LDLDIRECT 115.0 10/26/2019   TRIG 118.0 06/10/2023   CHOLHDL 5 06/10/2023  A/P: above goal but unlikely to start medication for primary prevention  #Panic attacks-have been  well controlled on Paxil  20 mg - only on  half tablet though -SI: none   # Asthma S: Maintenance Medication: Breo only if outdoors much- Symbicort  not covered.  As needed medication: albuterol  available but not needing A/P: stable- continue current medicines    # Low Bone density (formerly osteopenia) S: Last DEXA: Worst T score -2.4 on 09/27/2015 and right femoral neck. Had appointment but had to cancel  Calcium : 1200mg  (through diet ok) recommended - taking Vitamin D: 1000 units a day recommended- taking A/P: declines right now with foot issues and ankle issues but maybe in future   # GERD S:Medication: Omeprazole  20 mg. Flare off o fmeds in past -Has had normal B12 in the past  A/P: well controlled continue current medications    #vertigo- may want meclizine  refill - has worked well in past- not currently needing   Recommended follow up: Return in about 6 months (around 12/30/2024) for followup or sooner if needed.Schedule b4 you leave. Future Appointments  Date Time Provider Department Center  07/02/2024  9:30 AM [place], [place], [place] [facility]  02/10/2025  8:00 AM [facility] ANNUAL WELLNESS VISIT 1 [facility] Box   Lab/Order associations: will come back fasting   ICD-10-CM   1. Preventative health care  Z00.00     2. Diabetes mellitus without complication (HCC)  E11.9 Comprehensive metabolic panel with GFR    CBC with Differential/Platelet    Lipid panel    Hemoglobin A1c    Microalbumin / creatinine urine ratio      Meds ordered this encounter  Medications   albuterol  (VENTOLIN  HFA) 108 (90 Base) MCG/ACT inhaler    Sig: Inhale 2 puffs into the lungs every 6 (six) hours as needed for wheezing or shortness of breath.    Dispense:  18 g    Refill:  2    Return precautions advised.  Garnette Lukes, MD

## 2024-07-02 ENCOUNTER — Ambulatory Visit: Admitting: Physical Therapy

## 2024-07-02 DIAGNOSIS — M25571 Pain in right ankle and joints of right foot: Secondary | ICD-10-CM

## 2024-07-02 NOTE — Therapy (Signed)
 OUTPATIENT PHYSICAL THERAPY TREATMENT   Patient Name: Tammie [name] MRN: [id number] DOB:1934/11/26, 89 y.o., female Today's Date: 07/02/2024  END OF SESSION:  PT End of Session - 07/02/24 1123     Visit Number 16    Number of Visits 18    Date for Recertification  07/06/24    PT Start Time 0930    PT Stop Time 1029    PT Time Calculation (min) 59 min    Activity Tolerance Patient tolerated treatment well    Behavior During Therapy WFL for tasks assessed/performed               Past Medical History:  Diagnosis Date   Abnormal uterine bleeding    Allergic rhinitis    Allergy    seasonal   Anxiety    panic attacks   Anxiety disorder    Asthma    Cataract    left eye surgery to removed   Cataract    right eye   Colorectal cancer (HCC) 1997   T3, N0   Depression    pt states she isn't depressed anymore, continues paxil  daily   Endometriosis    Fibroid    GERD (gastroesophageal reflux disease)    Hemorrhoids    Hyperlipidemia    diet controlled, no meds   Hypertension    PONV (postoperative nausea and vomiting)    Prediabetes    Seasonal allergies    SVD (spontaneous vaginal delivery)    x 3   Thyroid  disease    Urinary incontinence    Past Surgical History:  Procedure Laterality Date   cataract surgery Left    COLECTOMY  1997   SIGMOID COLECTOMY 1997   COLON SURGERY  1997   COLONOSCOPY     DILATATION & CURETTAGE/HYSTEROSCOPY WITH MYOSURE N/A 11/21/2015   Procedure: DILATATION & CURETTAGE/HYSTEROSCOPY WITH MYOSURE;  Surgeon: Kate Hargis Nearing, MD;  Location: [facility];  Service: Gynecology;  Laterality: N/A;   DILATION AND CURETTAGE OF UTERUS     D & C   POLYPECTOMY     TONSILLECTOMY     Patient Active Problem List   Diagnosis Date Noted   Tick bite of thigh, left, initial encounter 02/07/2023   Venous insufficiency 11/15/2021   Hyperlipidemia 01/15/2018   Diabetes mellitus without complication (HCC) 07/15/2017   Vertigo 07/15/2017   Left  leg pain 12/20/2016   Hemorrhoids 12/20/2016   Dermatitis 12/20/2016   Osteopenia 10/02/2015   Systolic murmur 06/21/2015   Hypertension    Panic attacks    History of colorectal cancer    Allergic rhinitis    Asthma    GERD 06/22/2009    REFERRING PROVIDER: Ozell Jewell DO.  REFERRING DIAG: Pain in left shoulder.  Cervicalgia.  THERAPY DIAG:  Pain in right ankle and joints of right foot  Rationale for Evaluation and Treatment: Rehabilitation  ONSET DATE: ~About 6 months.  SUBJECTIVE:  SUBJECTIVE STATEMENT: Pain around a 2 or 3.   PERTINENT HISTORY: See above.  PAIN:  Are you having pain? Yes: NPRS scale: 2-3/10. Pain location: Right ankle. Pain description: Ache. Aggravating factors: Lying on side. Relieving factors: As above.  PRECAUTIONS: None  RED FLAGS: None   WEIGHT BEARING RESTRICTIONS: No  FALLS:  Has patient fallen in last 6 months? No  LIVING ENVIRONMENT: Lives in: House/apartment  Has following equipment at home: None   PLOF: Independent  PATIENT GOALS:Reduce pain and be able to lie on side  NEXT MD VISIT:   OBJECTIVE:  Note: Objective measures were completed at Evaluation unless otherwise noted.  DIAGNOSTIC FINDINGS:  No fractures. There is mild narrowing of the C4-C5, C5-C6, C6-C7 disc spaces minimal anterior osteophytic formation C5-C6 and C6-C7 mild posterior bony spondylosis with mild C5-6 foraminal narrowing, left more than right foraminal narrowing. C1-C2 articulation and predental space within normal limits   IMPRESSION: Mild degenerative changes of the cervical spine as described.  There is no evidence of fracture or dislocation. There is no evidence of arthropathy or other focal bone abnormality. Soft tissues are unremarkable.    IMPRESSION: Negative.    PATIENT SURVEYS:  Quick Dash 27.27   POSTURE: Forward head and rounded shoulders.  UPPER EXTREMITY ROM:   Active left shoulder flexion to 140 degrees and ER to 70 degrees (left is 90 degrees).  Bilateral active cervical rotation is 55 degrees.  UPPER EXTREMITY MMT:  Left shoulder ER/IR is a solid 4 to 4+/5.    SHOULDER SPECIAL TESTS: No pain with left shoulder impingement testing   PALPATION:  Tender to palpation over left UT and middle deltoid.  DTR's  Normal UE DTR's.                                                                                                                             TREATMENT DATE:    07/02/24:  Nustep level 3 x 10 minutes f/b Rockerboard x 5 minutes f/b STW/M x 23 minutes to patient's left plantar surface of foot, heel, Achilles tendon and calf f/b IFC at 80-150 Hz on 40% scan x 15 minutes.  Normal modality resposne following removal of modality.  06/23/24:  Nustep level 2 x 8 minutes f/b STW/M x 15 minutes to patient's right calf and Achilles f/b IFC at 40% scan and vasopneumtic x 20 minutes.  Normal modality resposne following removal of modality.   06/11/24:  US  at 1.50 W/CM2 x 10 minutes to affected right Achilles   STW/M x 10 minutes to RT lower calf and achilles  Vaso on low and IFC at 80-150 Hz on 40% scan x 15 minutes.   AROM exs LT foot ankle pumps, ev/inv, and toe scrunches RT foot   PATIENT EDUCATION: Education details: See below. Person educated: Patient Education method: Medical illustrator, handout Education comprehension: verbalized understanding and returned demonstration  HOME EXERCISE PROGRAM:  HOME EXERCISE PROGRAM [[id number]]  PECTORALIS CORNER  STRETCH -  Repeat 4 Repetitions, Hold 30 Seconds, Complete 1 Set, Perform 4 Times a Day  90 degree Pec stretch -  Repeat 4 Repetitions, Hold 30 Seconds, Complete 2 Sets, Perform 4 Times a Day  ASSESSMENT:  CLINICAL IMPRESSION: Patient's  pain left heel pain has been consistently lower.  She did very well with treatment and felt very good following.    ACTIVITY LIMITATIONS: carrying, lifting, and sleeping  PARTICIPATION LIMITATIONS: meal prep, cleaning, and laundry  PERSONAL FACTORS: Time since onset of injury/illness/exacerbation are also affecting patient's functional outcome.   REHAB POTENTIAL: Good  CLINICAL DECISION MAKING: Evolving/moderate complexity  EVALUATION COMPLEXITY: Moderate   GOALS:  SHORT TERM GOALS: Target date: 02/18/24.  Ind with an initial HEP. Goal status: MET   LONG TERM GOALS: Target date: 03/17/24.  Ind with an advanced HEP.  Goal status: MET.    2.  Improve active left shoulder ER to 80 degrees+  Goal status: MET  3.  Improve bilateral active cervical rotation to 65 degrees so she can more easily turn her head while driving.  Goal status:MET to right.  4.  Patient able to sleep on her side.   Baseline:  Goal status: INITIAL  5.  Perform ADL's with pain not > 3/10.  Goal status: INITIAL  6.  Patient walk a community distance with right Achilles pain not exceeding 2/10.  Ongoing.    PLAN:  PT FREQUENCY: 2x/week  PT DURATION: 6 weeks  PLANNED INTERVENTIONS: 97110-Therapeutic exercises, 97530- Therapeutic activity, [id number]- Neuromuscular re-education, 97535- Self Care, 02859- Manual therapy, G0283- Electrical stimulation (unattended), 97016- Vasopneumatic device, 97035- Ultrasound, Patient/Family education, Dry Needling, Cryotherapy, and Moist heat  PLAN FOR NEXT SESSION: Calf stretches, continue treatment to neck.  Modalities and STW/M to right Achilles.         Hennie Gosa, [place], PT 07/02/2024, 11:29 AM

## 2024-07-09 ENCOUNTER — Ambulatory Visit: Payer: Self-pay | Admitting: Family Medicine

## 2024-07-09 ENCOUNTER — Other Ambulatory Visit (INDEPENDENT_AMBULATORY_CARE_PROVIDER_SITE_OTHER)

## 2024-07-09 DIAGNOSIS — E119 Type 2 diabetes mellitus without complications: Secondary | ICD-10-CM

## 2024-07-09 LAB — COMPREHENSIVE METABOLIC PANEL WITH GFR
ALT: 15 U/L (ref 0–35)
AST: 19 U/L (ref 0–37)
Albumin: 4.3 g/dL (ref 3.5–5.2)
Alkaline Phosphatase: 83 U/L (ref 39–117)
BUN: 15 mg/dL (ref 6–23)
CO2: 31 meq/L (ref 19–32)
Calcium: 9.9 mg/dL (ref 8.4–10.5)
Chloride: 101 meq/L (ref 96–112)
Creatinine, Ser: 0.65 mg/dL (ref 0.40–1.20)
GFR: 79.49 mL/min (ref >60.00)
Glucose, Bld: 137 mg/dL — ABNORMAL HIGH (ref 70–99)
Potassium: 3.5 meq/L (ref 3.5–5.1)
Sodium: 141 meq/L (ref 135–145)
Total Bilirubin: 0.9 mg/dL (ref 0.2–1.2)
Total Protein: 7 g/dL (ref 6.0–8.3)

## 2024-07-09 LAB — LIPID PANEL
Cholesterol: 158 mg/dL (ref 0–200)
HDL: 34.6 mg/dL — ABNORMAL LOW (ref >39.00)
LDL Cholesterol: 95 mg/dL (ref 0–99)
NonHDL: 123.87
Total CHOL/HDL Ratio: 5
Triglycerides: 144 mg/dL (ref 0.0–149.0)
VLDL: 28.8 mg/dL (ref 0.0–40.0)

## 2024-07-09 LAB — HEMOGLOBIN A1C: Hgb A1c MFr Bld: 6.8 % — ABNORMAL HIGH (ref 4.6–6.5)

## 2024-07-09 LAB — CBC WITH DIFFERENTIAL/PLATELET
Basophils Absolute: 0 K/uL (ref 0.0–0.1)
Basophils Relative: 0.6 % (ref 0.0–3.0)
Eosinophils Absolute: 0.9 K/uL — ABNORMAL HIGH (ref 0.0–0.7)
Eosinophils Relative: 12 % — ABNORMAL HIGH (ref 0.0–5.0)
HCT: 43 % (ref 36.0–46.0)
Hemoglobin: 15 g/dL (ref 12.0–15.0)
Lymphocytes Relative: 16.5 % (ref 12.0–46.0)
Lymphs Abs: 1.2 K/uL (ref 0.7–4.0)
MCHC: 34.8 g/dL (ref 30.0–36.0)
MCV: 89.5 fl (ref 78.0–100.0)
Monocytes Absolute: 0.6 K/uL (ref 0.1–1.0)
Monocytes Relative: 7.7 % (ref 3.0–12.0)
Neutro Abs: 4.5 K/uL (ref 1.4–7.7)
Neutrophils Relative %: 63.2 % (ref 43.0–77.0)
Platelets: 236 K/uL (ref 150.0–400.0)
RBC: 4.8 Mil/uL (ref 3.87–5.11)
RDW: 13.6 % (ref 11.5–15.5)
WBC: 7.1 K/uL (ref 4.0–10.5)

## 2024-07-09 LAB — MICROALBUMIN / CREATININE URINE RATIO
Creatinine,U: 85.3 mg/dL
Microalb Creat Ratio: 264.2 mg/g — ABNORMAL HIGH (ref 0.0–30.0)
Microalb, Ur: 22.5 mg/dL — ABNORMAL HIGH (ref 0.0–1.9)

## 2024-07-12 ENCOUNTER — Ambulatory Visit: Admitting: Physical Therapy

## 2024-07-13 ENCOUNTER — Telehealth: Payer: Self-pay

## 2024-07-13 ENCOUNTER — Other Ambulatory Visit: Payer: Self-pay

## 2024-07-13 MED ORDER — LISINOPRIL-HYDROCHLOROTHIAZIDE 20-12.5 MG PO TABS: 2.0000 | ORAL_TABLET | Freq: Every day | ORAL | 3 refills | Status: DC

## 2024-07-13 NOTE — Progress Notes (Signed)
 Patient wanting to research the Jardiance before she agrees to try. Does not want to increase the lisinopril  to 40mg  because it makes her sleepy. Patient will call back this afternoon.

## 2024-07-13 NOTE — Telephone Encounter (Signed)
 Copied from CRM [id number]. Topic: Clinical - Prescription Issue >> Jul 13, 2024  4:31 PM Ashley R wrote: Reason for CRM: lisinopril -hydrochlorothiazide  (ZESTORETIC ) 20-12.5 MG tablet  New script sent - directions increased to 2 tablets daily, but quantity was not updated, still 90 days supply. Want to confirm if new instructions or quantity is correct  Callback [phone]

## 2024-07-13 NOTE — Telephone Encounter (Signed)
 40mg  refill sent to pharmacy.

## 2024-07-13 NOTE — Telephone Encounter (Signed)
 Copied from CRM [id number]. Topic: Clinical - Medication Question >> Jul 13, 2024  4:04 PM Alfonso HERO wrote: Reason for CRM: patient calling to ok the script for lisinopril  40mg  to be sent to her pharmacy.

## 2024-07-14 ENCOUNTER — Other Ambulatory Visit: Payer: Self-pay | Admitting: Family Medicine

## 2024-07-14 ENCOUNTER — Telehealth: Payer: Self-pay | Admitting: Family Medicine

## 2024-07-14 MED ORDER — EMPAGLIFLOZIN 10 MG PO TABS: 10.0000 mg | ORAL_TABLET | Freq: Every day | ORAL | 11 refills | Status: AC

## 2024-07-14 NOTE — Telephone Encounter (Signed)
 Jardiance 10 mg daily #30 with 11 refills

## 2024-07-14 NOTE — Telephone Encounter (Signed)
Patient aware and medication sent.  

## 2024-07-14 NOTE — Telephone Encounter (Signed)
 What Jardiance dose would you like for her to be on so I am prepared when I call her back.   Copied from CRM [id number]. Topic: General - Other >> Jul 14, 2024 10:01 AM Revonda D wrote: Reason for CRM: Pt stated that she wants to start taking the Jardiance and would like for the nurse to give her a callback today if possible.

## 2024-07-15 ENCOUNTER — Telehealth: Payer: Self-pay | Admitting: Family Medicine

## 2024-07-15 NOTE — Telephone Encounter (Signed)
 Directions confirmed with pharmacy.   Copied from CRM #[id number]. Topic: Clinical - Medication Question >> Jul 15, 2024  3:16 PM Viola F wrote: Reason for CRM: [facility] called to follow up on message from 10/28 confirming the directions for the lisinopril -hydrochlorothiazide  (ZESTORETIC ) 20-12.5 MG tablet - please call them to clarify at [phone] (Work)

## 2024-07-19 ENCOUNTER — Other Ambulatory Visit: Payer: Self-pay | Admitting: Family Medicine

## 2024-07-20 ENCOUNTER — Ambulatory Visit: Admitting: Physical Therapy

## 2024-07-20 ENCOUNTER — Telehealth: Payer: Self-pay | Admitting: Family Medicine

## 2024-07-20 ENCOUNTER — Other Ambulatory Visit: Payer: Self-pay | Admitting: Family Medicine

## 2024-07-20 ENCOUNTER — Telehealth: Payer: Self-pay

## 2024-07-20 MED ORDER — LISINOPRIL 40 MG PO TABS: 40.0000 mg | ORAL_TABLET | Freq: Every day | ORAL | 3 refills | Status: DC

## 2024-07-20 MED ORDER — HYDROCHLOROTHIAZIDE 12.5 MG PO TABS: 12.5000 mg | ORAL_TABLET | Freq: Every day | ORAL | 3 refills | Status: AC

## 2024-07-20 NOTE — Telephone Encounter (Signed)
 Spoke with pharmacist and disconinued the combination script for lisinopril  and hydroclorthiazide. Sent in 40 lisinopril  and 12.5 hydroclorathiazie. Pharmacist verbalized understanding and thanked for call.

## 2024-07-20 NOTE — Telephone Encounter (Signed)
 Returned patient call ad explained medication instructions and advised medication ready at [facility]. Pt verbalized understanding and no further questions at this time.    Copied from CRM #[id number]. Topic: General - Call Back - No Documentation >> Jul 20, 2024  2:02 PM Rea ORN wrote: Reason for CRM: Pt missed a call from [place]. Please call back [phone]

## 2024-07-20 NOTE — Telephone Encounter (Signed)
 Copied from CRM #[id number]. Topic: Clinical - Medication Question >> Jul 15, 2024  3:16 PM Viola F wrote: Reason for CRM: [facility] called to follow up on message from 10/28 confirming the directions for the lisinopril -hydrochlorothiazide  (ZESTORETIC ) 20-12.5 MG tablet - please call them to clarify at [phone] (Work) >> Jul 20, 2024 11:29 AM Logan F wrote: [facility] called back (3rd time) to get clarification on a rx. Directions on quanity on rx does not add up. The lisinopril -hydrochlorothiazide  (ZESTORETIC ) 20-12.5 MG tablet  rx shows medication is to taken twice daily but a 90 day supply was sent. The previous rx was 1x a day with a 90 day supply.

## 2024-07-22 ENCOUNTER — Ambulatory Visit: Attending: Sports Medicine | Admitting: Physical Therapy

## 2024-07-22 DIAGNOSIS — M542 Cervicalgia: Secondary | ICD-10-CM | POA: Insufficient documentation

## 2024-07-22 DIAGNOSIS — G8929 Other chronic pain: Secondary | ICD-10-CM | POA: Insufficient documentation

## 2024-07-22 DIAGNOSIS — M25571 Pain in right ankle and joints of right foot: Secondary | ICD-10-CM | POA: Diagnosis not present

## 2024-07-22 DIAGNOSIS — M25512 Pain in left shoulder: Secondary | ICD-10-CM | POA: Insufficient documentation

## 2024-07-22 NOTE — Therapy (Signed)
 OUTPATIENT PHYSICAL THERAPY TREATMENT   Patient Name: Tammie [name] MRN: [id number] DOB:04-18-1935, 89 y.o., female Today's Date: 07/22/2024  END OF SESSION:  PT End of Session - 07/22/24 1440     Visit Number 17    Number of Visits 18    Date for Recertification  07/06/24    PT Start Time 0145    PT Stop Time 0248    PT Time Calculation (min) 63 min    Activity Tolerance Patient tolerated treatment well    Behavior During Therapy WFL for tasks assessed/performed                Past Medical History:  Diagnosis Date   Abnormal uterine bleeding    Allergic rhinitis    Allergy    seasonal   Anxiety    panic attacks   Anxiety disorder    Asthma    Cataract    left eye surgery to removed   Cataract    right eye   Colorectal cancer (HCC) 1997   T3, N0   Depression    pt states she isn't depressed anymore, continues paxil  daily   Endometriosis    Fibroid    GERD (gastroesophageal reflux disease)    Hemorrhoids    Hyperlipidemia    diet controlled, no meds   Hypertension    PONV (postoperative nausea and vomiting)    Prediabetes    Seasonal allergies    SVD (spontaneous vaginal delivery)    x 3   Thyroid  disease    Urinary incontinence    Past Surgical History:  Procedure Laterality Date   cataract surgery Left    COLECTOMY  1997   SIGMOID COLECTOMY 1997   COLON SURGERY  1997   COLONOSCOPY     DILATATION & CURETTAGE/HYSTEROSCOPY WITH MYOSURE N/A 11/21/2015   Procedure: DILATATION & CURETTAGE/HYSTEROSCOPY WITH MYOSURE;  Surgeon: Kate Hargis Nearing, MD;  Location: [facility];  Service: Gynecology;  Laterality: N/A;   DILATION AND CURETTAGE OF UTERUS     D & C   POLYPECTOMY     TONSILLECTOMY     Patient Active Problem List   Diagnosis Date Noted   Tick bite of thigh, left, initial encounter 02/07/2023   Venous insufficiency 11/15/2021   Hyperlipidemia 01/15/2018   Diabetes mellitus without complication (HCC) 07/15/2017   Vertigo 07/15/2017   Left  leg pain 12/20/2016   Hemorrhoids 12/20/2016   Dermatitis 12/20/2016   Osteopenia 10/02/2015   Systolic murmur 06/21/2015   Hypertension    Panic attacks    History of colorectal cancer    Allergic rhinitis    Asthma    GERD 06/22/2009    REFERRING PROVIDER: Ozell Jewell DO.  REFERRING DIAG: Pain in left shoulder.  Cervicalgia.  THERAPY DIAG:  Pain in right ankle and joints of right foot  Rationale for Evaluation and Treatment: Rehabilitation  ONSET DATE: ~About 6 months.  SUBJECTIVE:  SUBJECTIVE STATEMENT: Pain is low.  PERTINENT HISTORY: See above.  PAIN:  Are you having pain? Yes: NPRS scale: See above/10. Pain location: Right ankle. Pain description: Ache. Aggravating factors: Lying on side. Relieving factors: As above.  PRECAUTIONS: None  RED FLAGS: None   WEIGHT BEARING RESTRICTIONS: No  FALLS:  Has patient fallen in last 6 months? No  LIVING ENVIRONMENT: Lives in: House/apartment  Has following equipment at home: None   PLOF: Independent  PATIENT GOALS:Reduce pain and be able to lie on side  NEXT MD VISIT:   OBJECTIVE:  Note: Objective measures were completed at Evaluation unless otherwise noted.  DIAGNOSTIC FINDINGS:  No fractures. There is mild narrowing of the C4-C5, C5-C6, C6-C7 disc spaces minimal anterior osteophytic formation C5-C6 and C6-C7 mild posterior bony spondylosis with mild C5-6 foraminal narrowing, left more than right foraminal narrowing. C1-C2 articulation and predental space within normal limits   IMPRESSION: Mild degenerative changes of the cervical spine as described.  There is no evidence of fracture or dislocation. There is no evidence of arthropathy or other focal bone abnormality. Soft tissues are unremarkable.    IMPRESSION: Negative.    PATIENT SURVEYS:  Quick Dash 27.27   POSTURE: Forward head and rounded shoulders.  UPPER EXTREMITY ROM:   Active left shoulder flexion to 140 degrees and ER to 70 degrees (left is 90 degrees).  Bilateral active cervical rotation is 55 degrees.  UPPER EXTREMITY MMT:  Left shoulder ER/IR is a solid 4 to 4+/5.    SHOULDER SPECIAL TESTS: No pain with left shoulder impingement testing   PALPATION:  Tender to palpation over left UT and middle deltoid.  DTR's  Normal UE DTR's.                                                                                                                             TREATMENT DATE:   07/22/24:   Nustep level 3 x 10 minutes f/b Rockerboard x 5 minutes f/b STW/M x 23 minutes to patient's left plantar surface of foot, heel, Achilles tendon and calf f/b IFC at 80-150 Hz on 40% scan x 15 minutes.  Normal modality resposne following removal of modality.   07/02/24:  Nustep level 3 x 10 minutes f/b Rockerboard x 5 minutes f/b STW/M x 23 minutes to patient's left plantar surface of foot, heel, Achilles tendon and calf f/b IFC at 80-150 Hz on 40% scan x 15 minutes.  Normal modality resposne following removal of modality.   PATIENT EDUCATION: Education details: See below. Person educated: Patient Education method: Medical Illustrator, handout Education comprehension: verbalized understanding and returned demonstration  HOME EXERCISE PROGRAM:  HOME EXERCISE PROGRAM [ESNSCR2]  PECTORALIS CORNER STRETCH -  Repeat 4 Repetitions, Hold 30 Seconds, Complete 1 Set, Perform 4 Times a Day  90 degree Pec stretch -  Repeat 4 Repetitions, Hold 30 Seconds, Complete 2 Sets, Perform 4 Times a Day  ASSESSMENT:  CLINICAL IMPRESSION: Excellent response to  treatments with no pain reported after treatment.  One visit remaining.    ACTIVITY LIMITATIONS: carrying, lifting, and sleeping  PARTICIPATION LIMITATIONS: meal prep,  cleaning, and laundry  PERSONAL FACTORS: Time since onset of injury/illness/exacerbation are also affecting patient's functional outcome.   REHAB POTENTIAL: Good  CLINICAL DECISION MAKING: Evolving/moderate complexity  EVALUATION COMPLEXITY: Moderate   GOALS:  SHORT TERM GOALS: Target date: 02/18/24.  Ind with an initial HEP. Goal status: MET   LONG TERM GOALS: Target date: 03/17/24.  Ind with an advanced HEP.  Goal status: MET.    2.  Improve active left shoulder ER to 80 degrees+  Goal status: MET  3.  Improve bilateral active cervical rotation to 65 degrees so she can more easily turn her head while driving.  Goal status:MET to right.  4.  Patient able to sleep on her side.   Baseline:  Goal status: INITIAL  5.  Perform ADL's with pain not > 3/10.  Goal status: INITIAL  6.  Patient walk a community distance with right Achilles pain not exceeding 2/10.  Ongoing.    PLAN:  PT FREQUENCY: 2x/week  PT DURATION: 6 weeks  PLANNED INTERVENTIONS: 97110-Therapeutic exercises, 97530- Therapeutic activity, [id number]- Neuromuscular re-education, 97535- Self Care, 02859- Manual therapy, G0283- Electrical stimulation (unattended), 97016- Vasopneumatic device, 97035- Ultrasound, Patient/Family education, Dry Needling, Cryotherapy, and Moist heat  PLAN FOR NEXT SESSION: Calf stretches, continue treatment to neck.  Modalities and STW/M to right Achilles.         Melvena Vink, PT 07/22/2024, 3:42 PM

## 2024-07-29 DIAGNOSIS — L578 Other skin changes due to chronic exposure to nonionizing radiation: Secondary | ICD-10-CM | POA: Diagnosis not present

## 2024-07-29 DIAGNOSIS — D225 Melanocytic nevi of trunk: Secondary | ICD-10-CM | POA: Diagnosis not present

## 2024-07-29 DIAGNOSIS — L814 Other melanin hyperpigmentation: Secondary | ICD-10-CM | POA: Diagnosis not present

## 2024-07-29 DIAGNOSIS — L820 Inflamed seborrheic keratosis: Secondary | ICD-10-CM | POA: Diagnosis not present

## 2024-08-03 ENCOUNTER — Ambulatory Visit: Admitting: Physical Therapy

## 2024-08-05 ENCOUNTER — Ambulatory Visit: Admitting: Physical Therapy

## 2024-08-05 DIAGNOSIS — M25571 Pain in right ankle and joints of right foot: Secondary | ICD-10-CM | POA: Diagnosis not present

## 2024-08-05 DIAGNOSIS — G8929 Other chronic pain: Secondary | ICD-10-CM

## 2024-08-05 DIAGNOSIS — M542 Cervicalgia: Secondary | ICD-10-CM

## 2024-08-05 NOTE — Therapy (Addendum)
 OUTPATIENT PHYSICAL THERAPY TREATMENT   Patient Name: Tammie [name] MRN: [id number] DOB:October 12, 1934, 89 y.o., female Today's Date: 08/05/2024  END OF SESSION:  PT End of Session - 08/05/24 1154     Visit Number 18    Number of Visits 18    Date for Recertification  08/05/24    PT Start Time 1100    PT Stop Time 1204    PT Time Calculation (min) 64 min    Activity Tolerance Patient tolerated treatment well    Behavior During Therapy WFL for tasks assessed/performed                Past Medical History:  Diagnosis Date   Abnormal uterine bleeding    Allergic rhinitis    Allergy    seasonal   Anxiety    panic attacks   Anxiety disorder    Asthma    Cataract    left eye surgery to removed   Cataract    right eye   Colorectal cancer (HCC) 1997   T3, N0   Depression    pt states she isn't depressed anymore, continues paxil  daily   Endometriosis    Fibroid    GERD (gastroesophageal reflux disease)    Hemorrhoids    Hyperlipidemia    diet controlled, no meds   Hypertension    PONV (postoperative nausea and vomiting)    Prediabetes    Seasonal allergies    SVD (spontaneous vaginal delivery)    x 3   Thyroid  disease    Urinary incontinence    Past Surgical History:  Procedure Laterality Date   cataract surgery Left    COLECTOMY  1997   SIGMOID COLECTOMY 1997   COLON SURGERY  1997   COLONOSCOPY     DILATATION & CURETTAGE/HYSTEROSCOPY WITH MYOSURE N/A 11/21/2015   Procedure: DILATATION & CURETTAGE/HYSTEROSCOPY WITH MYOSURE;  Surgeon: Kate Hargis Nearing, MD;  Location: [facility];  Service: Gynecology;  Laterality: N/A;   DILATION AND CURETTAGE OF UTERUS     D & C   POLYPECTOMY     TONSILLECTOMY     Patient Active Problem List   Diagnosis Date Noted   Tick bite of thigh, left, initial encounter 02/07/2023   Venous insufficiency 11/15/2021   Hyperlipidemia 01/15/2018   Diabetes mellitus without complication (HCC) 07/15/2017   Vertigo 07/15/2017   Left  leg pain 12/20/2016   Hemorrhoids 12/20/2016   Dermatitis 12/20/2016   Osteopenia 10/02/2015   Systolic murmur 06/21/2015   Hypertension    Panic attacks    History of colorectal cancer    Allergic rhinitis    Asthma    GERD 06/22/2009    REFERRING PROVIDER: Ozell Jewell DO.  REFERRING DIAG: Pain in left shoulder.  Cervicalgia.  THERAPY DIAG:  Pain in right ankle and joints of right foot  Chronic left shoulder pain  Cervicalgia  Rationale for Evaluation and Treatment: Rehabilitation  ONSET DATE: ~About 6 months.  SUBJECTIVE:  SUBJECTIVE STATEMENT: Pain remains low.  PERTINENT HISTORY: See above.  PAIN:  Are you having pain? Yes: NPRS scale: See above/10. Pain location: Right ankle. Pain description: Ache. Aggravating factors: Lying on side. Relieving factors: As above.  PRECAUTIONS: None  RED FLAGS: None   WEIGHT BEARING RESTRICTIONS: No  FALLS:  Has patient fallen in last 6 months? No  LIVING ENVIRONMENT: Lives in: House/apartment  Has following equipment at home: None   PLOF: Independent  PATIENT GOALS:Reduce pain and be able to lie on side  NEXT MD VISIT:   OBJECTIVE:  Note: Objective measures were completed at Evaluation unless otherwise noted.  DIAGNOSTIC FINDINGS:  No fractures. There is mild narrowing of the C4-C5, C5-C6, C6-C7 disc spaces minimal anterior osteophytic formation C5-C6 and C6-C7 mild posterior bony spondylosis with mild C5-6 foraminal narrowing, left more than right foraminal narrowing. C1-C2 articulation and predental space within normal limits   IMPRESSION: Mild degenerative changes of the cervical spine as described.  There is no evidence of fracture or dislocation. There is no evidence of arthropathy or other focal bone abnormality.  Soft tissues are unremarkable.   IMPRESSION: Negative.    PATIENT SURVEYS:  Quick Dash 27.27   POSTURE: Forward head and rounded shoulders.  UPPER EXTREMITY ROM:   Active left shoulder flexion to 140 degrees and ER to 70 degrees (left is 90 degrees).  Bilateral active cervical rotation is 55 degrees.  UPPER EXTREMITY MMT:  Left shoulder ER/IR is a solid 4 to 4+/5.    SHOULDER SPECIAL TESTS: No pain with left shoulder impingement testing   PALPATION:  Tender to palpation over left UT and middle deltoid.  DTR's  Normal UE DTR's.                                                                                                                             TREATMENT DATE:   08/05/24:  Nustep level 3 x 10 minutes f/b Rockerboard x 8 minutes f/b STW/M x 20 minutes to patient's left plantar surface of foot, heel, Achilles tendon and calf f/b IFC at 80-150 Hz on 40% scan x 15 minutes.  Normal modality resposne following removal of modality.   07/22/24:   Nustep level 3 x 10 minutes f/b Rockerboard x 5 minutes f/b STW/M x 23 minutes to patient's left plantar surface of foot, heel, Achilles tendon and calf f/b IFC at 80-150 Hz on 40% scan x 15 minutes.  Normal modality resposne following removal of modality.   07/02/24:  Nustep level 3 x 10 minutes f/b Rockerboard x 5 minutes f/b STW/M x 23 minutes to patient's left plantar surface of foot, heel, Achilles tendon and calf f/b IFC at 80-150 Hz on 40% scan x 15 minutes.  Normal modality resposne following removal of modality.   PATIENT EDUCATION: Education details: See below. Person educated: Patient Education method: Medical Illustrator, handout Education comprehension: verbalized understanding and returned demonstration  HOME EXERCISE PROGRAM:  HOME EXERCISE  PROGRAM [ESNSCR2]  PECTORALIS CORNER STRETCH -  Repeat 4 Repetitions, Hold 30 Seconds, Complete 1 Set, Perform 4 Times a Day  90 degree Pec stretch -  Repeat 4  Repetitions, Hold 30 Seconds, Complete 2 Sets, Perform 4 Times a Day  ASSESSMENT:  CLINICAL IMPRESSION: See below.  ACTIVITY LIMITATIONS: carrying, lifting, and sleeping  PARTICIPATION LIMITATIONS: meal prep, cleaning, and laundry  PERSONAL FACTORS: Time since onset of injury/illness/exacerbation are also affecting patient's functional outcome.   REHAB POTENTIAL: Good  CLINICAL DECISION MAKING: Evolving/moderate complexity  EVALUATION COMPLEXITY: Moderate   GOALS:  SHORT TERM GOALS: Target date: 02/18/24.  Ind with an initial HEP. Goal status: MET   LONG TERM GOALS: Target date: 03/17/24.  Ind with an advanced HEP.  Goal status: MET.    2.  Improve active left shoulder ER to 80 degrees+  Goal status: MET  3.  Improve bilateral active cervical rotation to 65 degrees so she can more easily turn her head while driving.  Goal status:MET to right.  4.  Patient able to sleep on her side.   Baseline:  Goal status: MET.  5.  Perform ADL's with pain not > 3/10.  Goal status: MET.  6.  Patient walk a community distance with right Achilles pain not exceeding 2/10.  MET PLAN:  PT FREQUENCY: 2x/week  PT DURATION: 6 weeks  PLANNED INTERVENTIONS: 97110-Therapeutic exercises, 97530- Therapeutic activity, [id number]- Neuromuscular re-education, 97535- Self Care, 02859- Manual therapy, G0283- Electrical stimulation (unattended), 97016- Vasopneumatic device, 97035- Ultrasound, Patient/Family education, Dry Needling, Cryotherapy, and Moist heat  PLAN FOR NEXT SESSION: Calf stretches, continue treatment to neck.  Modalities and STW/M to right Achilles.     PHYSICAL THERAPY DISCHARGE SUMMARY  Visits from Start of Care: 18.  Current functional level related to goals / functional outcomes: See above.     Remaining deficits: See above.  Please very pleased with her overall progress and met all but her left active cervical rotation goal.   Education / Equipment: HEP.   Patient  agrees to discharge. Patient goals were partially met. Patient is being discharged due to being pleased with the current functional level.     Lashia Niese, PT 08/05/2024, 12:11 PM

## 2024-08-05 NOTE — Addendum Note (Signed)
 Addended by: Robyne Matar W on: 08/05/2024 12:15 PM   Modules accepted: Orders

## 2024-08-24 ENCOUNTER — Encounter: Payer: Self-pay | Admitting: Pharmacist

## 2024-08-24 NOTE — Progress Notes (Signed)
 Pharmacy Quality Measure Review  This patient is appearing on a report for being at risk of failing the adherence measure for diabetes medications this calendar year.   Medication: Jardiance  Last fill date: 07/14/2024 for 30 day supply  Reviewed recent refill history in Dr Annemarie database. Actual last refill date was 08/16/2024 for 30 day supply. Patient has 10 refills remaining. Next appointment with PCP is 12/28/2024.    Insurance report was not up to date. No action needed at this time.   Madelin Ray, PharmD Clinical Pharmacist [facility]  Population Health [phone]

## 2024-09-27 ENCOUNTER — Ambulatory Visit: Payer: Self-pay

## 2024-09-27 NOTE — Telephone Encounter (Signed)
 Patient scheduled to see Corean Comment 09/29/2023. Dr. Katrinka will review triage notes.

## 2024-09-27 NOTE — Telephone Encounter (Signed)
 FYI Only or Action Required?: FYI only for provider: appointment scheduled on 09/28/24.  Patient was last seen in primary care on 07/01/2024 by Katrinka Garnette KIDD, MD.  Called Nurse Triage reporting [place].  Symptoms began yesterday.  Interventions attempted: Rest, hydration, or home remedies.  Symptoms are: gradually worsening.  Triage Disposition: See Physician Within 24 Hours  Patient/caregiver understands and will follow disposition?:  Reason for Disposition  [1] Swelling is painful to touch AND [2] no fever  Answer Assessment - Initial Assessment Questions 1. APPEARANCE of SWELLING: What does it look like?     Lump  2. SIZE: How large is the swelling? (e.g., inches, cm; or compare to size of pinhead, tip of pen, eraser, coin, pea, grape, ping pong ball)      Unable to visualize, feels like small marble 3. LOCATION: Where is the swelling located?     Left sided jaw 4. ONSET: When did the swelling start?     Yesterday  5. COLOR: What color is it? Is there more than one color?     Flesh  6. PAIN: Is there any pain? If Yes, ask: How bad is the pain? (Scale 1-10; or mild, moderate, severe)       Tender to touch  7. ITCH: Does it itch? If Yes, ask: How bad is the itch?      denies 8. CAUSE: What do you think caused the swelling?     Maybe ear infection-no pain but has had issues with the left ear.  9 OTHER SYMPTOMS: Do you have any other symptoms? (e.g., fever)     Denies all other symptoms  Protocols used: Skin Lump or Localized Swelling-A-AH Copied from CRM #[id number]. Topic: Clinical - Red Word Triage >> Sep 27, 2024  1:50 PM China J wrote: [facility] that prompted transfer to Nurse Triage: Lymph node under chin is swollen with pain. She also thinks she is having a left ear infection.

## 2024-09-28 ENCOUNTER — Ambulatory Visit (INDEPENDENT_AMBULATORY_CARE_PROVIDER_SITE_OTHER): Admitting: Family

## 2024-09-28 ENCOUNTER — Encounter: Payer: Self-pay | Admitting: Family

## 2024-09-28 VITALS — BP 112/67 | HR 59 | Temp 98.0°F | Ht 64.0 in | Wt 160.4 lb

## 2024-09-28 DIAGNOSIS — H60391 Other infective otitis externa, right ear: Secondary | ICD-10-CM | POA: Diagnosis not present

## 2024-09-28 DIAGNOSIS — R591 Generalized enlarged lymph nodes: Secondary | ICD-10-CM | POA: Diagnosis not present

## 2024-09-28 MED ORDER — CIPROFLOXACIN-DEXAMETHASONE 0.3-0.1 % OT SUSP: 3.0000 [drp] | Freq: Two times a day (BID) | OTIC | 0 refills | Status: AC

## 2024-09-28 NOTE — Progress Notes (Signed)
 "  Patient ID: Tammie [name], female    DOB: 07/27/1935, 89 y.o.   MRN: [id number]  Chief Complaint  Patient presents with   Ear Fullness    Pt c/o left ear fullness,itching and dryness. Present since Sunday night.   Discussed the use of AI scribe software for clinical note transcription with the patient, who gave verbal consent to proceed.  History of Present Illness Tammie [name] is an 89 year old female who presents with left ear pain and swelling.  She reports left ear pain and swelling that worsened about two months ago. On Sunday she noted a new tender lump under the left earlobe. The ear feels itchy and dry. She frequently scratches or rubs it, especially while watching TV, and occasionally uses over-the-counter earwax drops. She has cervical arthritis with bone spurs diagnosed two years ago, causing constant neck pain that radiates to her head and can trigger headaches. Pain worsens with certain movements and limits her range of motion.  Assessment & Plan Right otitis externa Right ear otitis externa likely . Possible infection indicated by white layer in the ear canal. Lymph node tenderness likely reactive to external irritation rather than infection. - Prescribed Ciprodex  ear drops with steroid for right ear, twice daily for one week. - Instructed to use 3-4 drops per application and place a cotton ball in the ear for about an hour after application. - Advised to avoid scratching the ear and use the pad of the finger instead of nails. - Scheduled follow-up appointment in 10 days-2 weeks to recheck the ear.  Lymphadenopathy Left preauricular lymphadenopathy noted, approx 1.5cm in diameter, tender w/palpation, likely reactive due to scratching and rubbing of ear, TM wnl, small red spot noted behind left ear possibly due to scratching.  - Advised to avoid scratching ear, lymph node should decrease in size and tenderness over next 1-2 weeks. - F/U in 10d- 2 weeks  Cervical  spondylosis with osteoarthritis Chronic cervical spondylosis with radiculopathy and osteoarthritis, causing persistent pain and stiffness, particularly on the left side. Pain radiates from the neck to the head, likely due to bone spurs causing pressure.  Subjective:    Outpatient Medications Prior to Visit  Medication Sig Dispense Refill   albuterol  (VENTOLIN  HFA) 108 (90 Base) MCG/ACT inhaler Inhale 2 puffs into the lungs every 6 (six) hours as needed for wheezing or shortness of breath. 18 g 2   amLODipine  (NORVASC ) 10 MG tablet TAKE ONE TABLET ONCE DAILY 90 tablet 3   augmented betamethasone dipropionate (DIPROLENE-AF) 0.05 % cream      clobetasol  (TEMOVATE ) 0.05 % external solution Apply 1 application topically 2 (two) times daily. For 7-10 days up to twice a day maximum. Use only on scalp 50 mL 0   empagliflozin  (JARDIANCE ) 10 MG TABS tablet Take 1 tablet (10 mg total) by mouth daily. 30 tablet 11   fluticasone  furoate-vilanterol (BREO ELLIPTA ) 100-25 MCG/ACT AEPB Inhale 1 puff into the lungs daily. 1 each 11   hydrochlorothiazide  (HYDRODIURIL ) 12.5 MG tablet Take 1 tablet (12.5 mg total) by mouth daily. 90 tablet 3   hydrocortisone  (ANUSOL -HC) 2.5 % rectal cream Place 1 application rectally 2 times daily as needed for hemorrhoids. 28.35 g 1   lisinopril  (ZESTRIL ) 40 MG tablet Take 1 tablet (40 mg total) by mouth daily. 90 tablet 3   lisinopril -hydrochlorothiazide  (ZESTORETIC ) 20-12.5 MG tablet Take 2 tablets by mouth daily. 90 tablet 3   meclizine  (ANTIVERT ) 25 MG tablet TAKE ONE TABLET TWICE DAILY  AS NEEDED FOR vertigo 30 tablet 0   omeprazole  (PRILOSEC) 20 MG capsule TAKE ONE CAPSULE ONCE DAILY 90 capsule 3   PARoxetine  (PAXIL ) 20 MG tablet TAKE ONE TABLET EVERY DAY 90 tablet 3   Vitamin D-Vitamin K (VITAMIN K2-VITAMIN D3 PO) Take by mouth. Drops     No facility-administered medications prior to visit.   Past Medical History:  Diagnosis Date   Abnormal uterine bleeding    Allergic  rhinitis    Allergy    seasonal   Anxiety    panic attacks   Anxiety disorder    Asthma    Cataract    left eye surgery to removed   Cataract    right eye   Colorectal cancer (HCC) 1997   T3, N0   Depression    pt states she isn't depressed anymore, continues paxil  daily   Endometriosis    Fibroid    GERD (gastroesophageal reflux disease)    Hemorrhoids    Hyperlipidemia    diet controlled, no meds   Hypertension    PONV (postoperative nausea and vomiting)    Prediabetes    Seasonal allergies    SVD (spontaneous vaginal delivery)    x 3   Thyroid  disease    Urinary incontinence    Past Surgical History:  Procedure Laterality Date   cataract surgery Left    COLECTOMY  1997   SIGMOID COLECTOMY 1997   COLON SURGERY  1997   COLONOSCOPY     DILATATION & CURETTAGE/HYSTEROSCOPY WITH MYOSURE N/A 11/21/2015   Procedure: DILATATION & CURETTAGE/HYSTEROSCOPY WITH MYOSURE;  Surgeon: Kate Hargis Nearing, MD;  Location: [facility];  Service: Gynecology;  Laterality: N/A;   DILATION AND CURETTAGE OF UTERUS     D & C   POLYPECTOMY     TONSILLECTOMY     Allergies[1]    Objective:    Physical Exam Vitals and nursing note reviewed.  Constitutional:      Appearance: Normal appearance.  HENT:     Right Ear: Drainage (mild cerumen noted, small thin layer of whitish drainage noted close to TM) and swelling (with mild erythema after disimpaction) present.     Left Ear: Tympanic membrane and ear canal normal.  Cardiovascular:     Rate and Rhythm: Normal rate and regular rhythm.  Pulmonary:     Effort: Pulmonary effort is normal.     Breath sounds: Normal breath sounds.  Musculoskeletal:        General: Normal range of motion.  Lymphadenopathy:     Head:     Left side of head: Preauricular (approx 1.5cm, no erythema, tender w/palpation) adenopathy present.  Skin:    General: Skin is warm and dry.  Neurological:     Mental Status: She is alert.  Psychiatric:        Mood and  Affect: Mood normal.        Behavior: Behavior normal.    BP 112/67 (BP Location: Left Arm, Patient Position: Sitting, Cuff Size: Normal)   Pulse (!) 59   Temp 98 F (36.7 C) (Temporal)   Ht 5' 4 (1.626 m)   Wt 160 lb 6.4 oz (72.8 kg)   SpO2 96%   BMI 27.53 kg/m  Wt Readings from Last 3 Encounters:  09/28/24 160 lb 6.4 oz (72.8 kg)  07/01/24 164 lb 12.8 oz (74.8 kg)  02/04/24 169 lb (76.7 kg)      Lucius Krabbe, NP     [1]  Allergies Allergen Reactions  Erythromycin     unknown   Fentanyl  Hives   Heparin     questionable, unknown   Hydrocodone Other (See Comments)   Hydrocodone-Acetaminophen  Hives   Meperidine Hcl     unknown   Penicillins     Has patient had a PCN reaction causing immediate rash, facial/tongue/throat swelling, SOB or lightheadedness with hypotension: no Has patient had a PCN reaction causing severe rash involving mucus membranes or skin necrosis: no Has patient had a PCN reaction that required hospitalization no Has patient had a PCN reaction occurring within the last 10 years: no If all of the above answers are NO, then may proceed with Cephalosporin use.    Procaine Hcl     Could not sleep   Promethazine Hcl     unknown   Temazepam Hives    unknown   "

## 2024-10-04 ENCOUNTER — Other Ambulatory Visit: Payer: Self-pay

## 2024-10-04 ENCOUNTER — Telehealth: Payer: Self-pay | Admitting: Family Medicine

## 2024-10-04 MED ORDER — LISINOPRIL 40 MG PO TABS: 40.0000 mg | ORAL_TABLET | Freq: Every day | ORAL | 3 refills | Status: AC

## 2024-10-04 NOTE — Telephone Encounter (Signed)
 Spoke with patient and sent in correct lisinopril  to her pharmacy.   Copied from CRM [id number]. Topic: Clinical - Prescription Issue >> Oct 04, 2024 11:44 AM Zebedee SAUNDERS wrote: Reason for CRM: Pt calling regarding the change in lisinopril  (ZESTRIL ) 40 MG tablet,  lisinopril -hydrochlorothiazide  (ZESTORETIC ) 20-12.5 MG tablet. Pt would like a call back to [phone].

## 2024-10-08 ENCOUNTER — Ambulatory Visit: Admitting: Family

## 2024-10-14 ENCOUNTER — Ambulatory Visit: Admitting: Family

## 2024-10-18 ENCOUNTER — Ambulatory Visit: Admitting: Family

## 2024-10-26 ENCOUNTER — Ambulatory Visit: Admitting: Family Medicine

## 2024-10-26 ENCOUNTER — Ambulatory Visit: Admitting: Family

## 2024-12-31 ENCOUNTER — Ambulatory Visit: Admitting: Family Medicine

## 2025-01-03 ENCOUNTER — Ambulatory Visit: Admitting: Family Medicine

## 2025-02-10 ENCOUNTER — Ambulatory Visit
